# Patient Record
Sex: Female | Born: 1935
Health system: Southern US, Community
[De-identification: ages and names within clinical notes are randomized; demographics above are authoritative.]

## PROBLEM LIST (undated history)

## (undated) DIAGNOSIS — R1310 Dysphagia, unspecified: Secondary | ICD-10-CM

## (undated) DIAGNOSIS — K922 Gastrointestinal hemorrhage, unspecified: Secondary | ICD-10-CM

## (undated) DIAGNOSIS — I619 Nontraumatic intracerebral hemorrhage, unspecified: Secondary | ICD-10-CM

## (undated) DIAGNOSIS — C49A2 Gastrointestinal stromal tumor of stomach: Secondary | ICD-10-CM

## (undated) DIAGNOSIS — F329 Major depressive disorder, single episode, unspecified: Secondary | ICD-10-CM

## (undated) DIAGNOSIS — E7800 Pure hypercholesterolemia, unspecified: Secondary | ICD-10-CM

## (undated) DIAGNOSIS — R471 Dysarthria and anarthria: Secondary | ICD-10-CM

## (undated) DIAGNOSIS — I639 Cerebral infarction, unspecified: Secondary | ICD-10-CM

## (undated) DIAGNOSIS — I10 Essential (primary) hypertension: Secondary | ICD-10-CM

## (undated) DIAGNOSIS — G459 Transient cerebral ischemic attack, unspecified: Secondary | ICD-10-CM

## (undated) DIAGNOSIS — K219 Gastro-esophageal reflux disease without esophagitis: Secondary | ICD-10-CM

## (undated) DIAGNOSIS — M25519 Pain in unspecified shoulder: Secondary | ICD-10-CM

## (undated) DIAGNOSIS — F29 Unspecified psychosis not due to a substance or known physiological condition: Secondary | ICD-10-CM

## (undated) DIAGNOSIS — R2681 Unsteadiness on feet: Secondary | ICD-10-CM

## (undated) DIAGNOSIS — F0390 Unspecified dementia without behavioral disturbance: Secondary | ICD-10-CM

## (undated) DIAGNOSIS — E119 Type 2 diabetes mellitus without complications: Secondary | ICD-10-CM

## (undated) DIAGNOSIS — E039 Hypothyroidism, unspecified: Secondary | ICD-10-CM

## (undated) DIAGNOSIS — K279 Peptic ulcer, site unspecified, unspecified as acute or chronic, without hemorrhage or perforation: Secondary | ICD-10-CM

## (undated) DIAGNOSIS — E559 Vitamin D deficiency, unspecified: Secondary | ICD-10-CM

## (undated) DIAGNOSIS — F32A Depression, unspecified: Secondary | ICD-10-CM

## (undated) DIAGNOSIS — I2510 Atherosclerotic heart disease of native coronary artery without angina pectoris: Secondary | ICD-10-CM

## (undated) DIAGNOSIS — M1990 Unspecified osteoarthritis, unspecified site: Secondary | ICD-10-CM

## (undated) HISTORY — PX: OTHER SURGICAL HISTORY: SHX169

## (undated) HISTORY — DX: Unspecified osteoarthritis, unspecified site: M19.90

## (undated) HISTORY — DX: Peptic ulcer, site unspecified, unspecified as acute or chronic, without hemorrhage or perforation: K27.9

## (undated) HISTORY — DX: Hypothyroidism, unspecified: E03.9

## (undated) HISTORY — DX: Pure hypercholesterolemia, unspecified: E78.00

## (undated) HISTORY — DX: Transient cerebral ischemic attack, unspecified: G45.9

## (undated) HISTORY — PX: CORONARY ARTERY BYPASS GRAFT: SHX141

## (undated) HISTORY — DX: Atherosclerotic heart disease of native coronary artery without angina pectoris: I25.10

## (undated) HISTORY — PX: ABDOMINAL HYSTERECTOMY: SHX81

## (undated) HISTORY — DX: Gastro-esophageal reflux disease without esophagitis: K21.9

## (undated) HISTORY — PX: CATARACT EXTRACTION: SUR2

## (undated) HISTORY — DX: Gastrointestinal stromal tumor of stomach: C49.A2

## (undated) HISTORY — DX: Type 2 diabetes mellitus without complications: E11.9

## (undated) HISTORY — DX: Gastrointestinal hemorrhage, unspecified: K92.2

## (undated) HISTORY — DX: Essential (primary) hypertension: I10

---

## 1999-06-15 HISTORY — PX: PARTIAL GASTRECTOMY: SHX2172

## 2001-04-19 ENCOUNTER — Other Ambulatory Visit: Admission: RE | Admit: 2001-04-19 | Discharge: 2001-04-19 | Payer: Self-pay | Admitting: Endocrinology

## 2001-04-28 ENCOUNTER — Encounter: Payer: Self-pay | Admitting: Endocrinology

## 2001-04-28 ENCOUNTER — Ambulatory Visit (HOSPITAL_COMMUNITY): Admission: RE | Admit: 2001-04-28 | Discharge: 2001-04-28 | Payer: Self-pay | Admitting: Endocrinology

## 2001-04-28 ENCOUNTER — Encounter (INDEPENDENT_AMBULATORY_CARE_PROVIDER_SITE_OTHER): Payer: Self-pay | Admitting: *Deleted

## 2003-01-18 ENCOUNTER — Encounter (HOSPITAL_COMMUNITY): Admission: RE | Admit: 2003-01-18 | Discharge: 2003-02-17 | Payer: Self-pay | Admitting: Endocrinology

## 2005-01-06 ENCOUNTER — Ambulatory Visit: Payer: Self-pay | Admitting: Cardiology

## 2005-01-06 ENCOUNTER — Inpatient Hospital Stay (HOSPITAL_COMMUNITY): Admission: AD | Admit: 2005-01-06 | Discharge: 2005-01-21 | Payer: Self-pay | Admitting: Cardiology

## 2005-01-07 ENCOUNTER — Ambulatory Visit: Payer: Self-pay | Admitting: Cardiology

## 2005-01-08 ENCOUNTER — Encounter: Payer: Self-pay | Admitting: Cardiology

## 2005-01-28 ENCOUNTER — Encounter: Admission: RE | Admit: 2005-01-28 | Discharge: 2005-01-28 | Payer: Self-pay | Admitting: Cardiothoracic Surgery

## 2005-02-09 ENCOUNTER — Ambulatory Visit: Payer: Self-pay | Admitting: Cardiovascular Disease

## 2005-03-01 ENCOUNTER — Encounter (HOSPITAL_COMMUNITY): Admission: RE | Admit: 2005-03-01 | Discharge: 2005-03-12 | Payer: Self-pay | Admitting: Cardiology

## 2005-03-15 ENCOUNTER — Encounter (HOSPITAL_COMMUNITY): Admission: RE | Admit: 2005-03-15 | Discharge: 2005-04-14 | Payer: Self-pay | Admitting: Cardiology

## 2005-03-28 ENCOUNTER — Emergency Department (HOSPITAL_COMMUNITY): Admission: EM | Admit: 2005-03-28 | Discharge: 2005-03-28 | Payer: Self-pay | Admitting: Emergency Medicine

## 2005-04-16 ENCOUNTER — Encounter (HOSPITAL_COMMUNITY): Admission: RE | Admit: 2005-04-16 | Discharge: 2005-05-16 | Payer: Self-pay | Admitting: Cardiology

## 2005-05-05 ENCOUNTER — Ambulatory Visit: Payer: Self-pay | Admitting: Cardiology

## 2005-06-08 ENCOUNTER — Ambulatory Visit: Payer: Self-pay | Admitting: Cardiology

## 2005-06-16 ENCOUNTER — Ambulatory Visit: Payer: Self-pay | Admitting: Cardiology

## 2005-09-13 ENCOUNTER — Encounter (HOSPITAL_COMMUNITY): Admission: RE | Admit: 2005-09-13 | Discharge: 2005-10-13 | Payer: Self-pay | Admitting: Cardiology

## 2005-10-14 ENCOUNTER — Encounter (HOSPITAL_COMMUNITY): Admission: RE | Admit: 2005-10-14 | Discharge: 2005-11-13 | Payer: Self-pay | Admitting: Cardiology

## 2005-11-03 ENCOUNTER — Ambulatory Visit: Payer: Self-pay | Admitting: *Deleted

## 2005-11-03 ENCOUNTER — Inpatient Hospital Stay (HOSPITAL_COMMUNITY): Admission: EM | Admit: 2005-11-03 | Discharge: 2005-11-05 | Payer: Self-pay | Admitting: Emergency Medicine

## 2005-11-15 ENCOUNTER — Encounter (HOSPITAL_COMMUNITY): Admission: RE | Admit: 2005-11-15 | Discharge: 2005-12-15 | Payer: Self-pay | Admitting: Cardiology

## 2005-12-17 ENCOUNTER — Encounter (HOSPITAL_COMMUNITY): Admission: RE | Admit: 2005-12-17 | Discharge: 2006-01-16 | Payer: Self-pay | Admitting: Cardiology

## 2006-01-26 ENCOUNTER — Ambulatory Visit: Payer: Self-pay | Admitting: Cardiology

## 2006-02-24 ENCOUNTER — Ambulatory Visit: Payer: Self-pay | Admitting: Cardiology

## 2006-03-28 ENCOUNTER — Ambulatory Visit: Payer: Self-pay | Admitting: Cardiology

## 2007-04-21 ENCOUNTER — Ambulatory Visit: Payer: Self-pay | Admitting: Cardiology

## 2007-10-18 ENCOUNTER — Encounter: Payer: Self-pay | Admitting: Physician Assistant

## 2007-10-18 ENCOUNTER — Ambulatory Visit: Payer: Self-pay | Admitting: Cardiology

## 2007-11-27 ENCOUNTER — Ambulatory Visit (HOSPITAL_COMMUNITY): Admission: RE | Admit: 2007-11-27 | Discharge: 2007-11-27 | Payer: Self-pay | Admitting: Urology

## 2007-11-28 ENCOUNTER — Ambulatory Visit: Payer: Self-pay | Admitting: Cardiology

## 2008-01-02 ENCOUNTER — Ambulatory Visit (HOSPITAL_COMMUNITY): Admission: RE | Admit: 2008-01-02 | Discharge: 2008-01-02 | Payer: Self-pay | Admitting: Urology

## 2008-04-17 ENCOUNTER — Ambulatory Visit (HOSPITAL_COMMUNITY): Admission: RE | Admit: 2008-04-17 | Discharge: 2008-04-17 | Payer: Self-pay | Admitting: Urology

## 2008-05-02 ENCOUNTER — Ambulatory Visit (HOSPITAL_COMMUNITY): Admission: RE | Admit: 2008-05-02 | Discharge: 2008-05-02 | Payer: Self-pay | Admitting: Urology

## 2008-05-28 ENCOUNTER — Inpatient Hospital Stay (HOSPITAL_COMMUNITY): Admission: EM | Admit: 2008-05-28 | Discharge: 2008-06-01 | Payer: Self-pay | Admitting: Emergency Medicine

## 2008-05-28 ENCOUNTER — Ambulatory Visit: Payer: Self-pay | Admitting: Cardiovascular Disease

## 2008-05-29 ENCOUNTER — Encounter: Payer: Self-pay | Admitting: Cardiology

## 2008-05-29 ENCOUNTER — Encounter (INDEPENDENT_AMBULATORY_CARE_PROVIDER_SITE_OTHER): Payer: Self-pay | Admitting: Internal Medicine

## 2008-06-01 ENCOUNTER — Encounter: Payer: Self-pay | Admitting: Cardiology

## 2008-07-03 ENCOUNTER — Ambulatory Visit: Payer: Self-pay | Admitting: Cardiology

## 2008-12-19 ENCOUNTER — Ambulatory Visit (HOSPITAL_COMMUNITY): Admission: RE | Admit: 2008-12-19 | Discharge: 2008-12-19 | Payer: Self-pay | Admitting: Urology

## 2008-12-19 ENCOUNTER — Telehealth: Payer: Self-pay | Admitting: Cardiology

## 2008-12-31 ENCOUNTER — Ambulatory Visit: Payer: Self-pay | Admitting: Cardiology

## 2009-01-02 ENCOUNTER — Encounter: Payer: Self-pay | Admitting: Cardiology

## 2009-01-12 ENCOUNTER — Ambulatory Visit: Payer: Self-pay | Admitting: Cardiology

## 2009-01-23 ENCOUNTER — Telehealth: Payer: Self-pay | Admitting: Cardiology

## 2009-01-24 ENCOUNTER — Ambulatory Visit: Payer: Self-pay | Admitting: Cardiology

## 2009-01-27 DIAGNOSIS — I080 Rheumatic disorders of both mitral and aortic valves: Secondary | ICD-10-CM | POA: Insufficient documentation

## 2009-01-27 DIAGNOSIS — I25810 Atherosclerosis of coronary artery bypass graft(s) without angina pectoris: Secondary | ICD-10-CM

## 2009-01-27 DIAGNOSIS — E785 Hyperlipidemia, unspecified: Secondary | ICD-10-CM

## 2009-01-27 DIAGNOSIS — I10 Essential (primary) hypertension: Secondary | ICD-10-CM

## 2009-01-28 ENCOUNTER — Ambulatory Visit: Payer: Self-pay | Admitting: Cardiology

## 2009-01-28 ENCOUNTER — Encounter: Payer: Self-pay | Admitting: Cardiology

## 2009-01-28 DIAGNOSIS — R5381 Other malaise: Secondary | ICD-10-CM

## 2009-01-28 DIAGNOSIS — R141 Gas pain: Secondary | ICD-10-CM

## 2009-01-28 DIAGNOSIS — R142 Eructation: Secondary | ICD-10-CM

## 2009-01-28 DIAGNOSIS — R143 Flatulence: Secondary | ICD-10-CM

## 2009-01-28 DIAGNOSIS — R5383 Other fatigue: Secondary | ICD-10-CM

## 2009-06-14 DIAGNOSIS — E039 Hypothyroidism, unspecified: Secondary | ICD-10-CM

## 2009-06-14 HISTORY — DX: Hypothyroidism, unspecified: E03.9

## 2009-06-15 ENCOUNTER — Encounter: Payer: Self-pay | Admitting: Cardiology

## 2009-06-16 ENCOUNTER — Encounter: Payer: Self-pay | Admitting: Cardiology

## 2009-08-07 ENCOUNTER — Encounter: Payer: Self-pay | Admitting: Cardiology

## 2009-09-09 ENCOUNTER — Ambulatory Visit: Payer: Self-pay | Admitting: Cardiology

## 2009-09-27 ENCOUNTER — Emergency Department (HOSPITAL_COMMUNITY): Admission: EM | Admit: 2009-09-27 | Discharge: 2009-09-27 | Payer: Self-pay | Admitting: Emergency Medicine

## 2009-10-15 ENCOUNTER — Encounter: Payer: Self-pay | Admitting: Physician Assistant

## 2009-12-08 ENCOUNTER — Encounter: Payer: Self-pay | Admitting: Physician Assistant

## 2009-12-13 IMAGING — CR DG ABDOMEN 1V
1 series · 1 of 1 positions shown · non-contrast
Comparison: 05/02/2008 and earlier.

CLINICAL DATA: 82-year-old female with lower abdominal pain,
frequent urination, left-sided stenting.

ABDOMEN - 1 VIEW

[view not recorded]
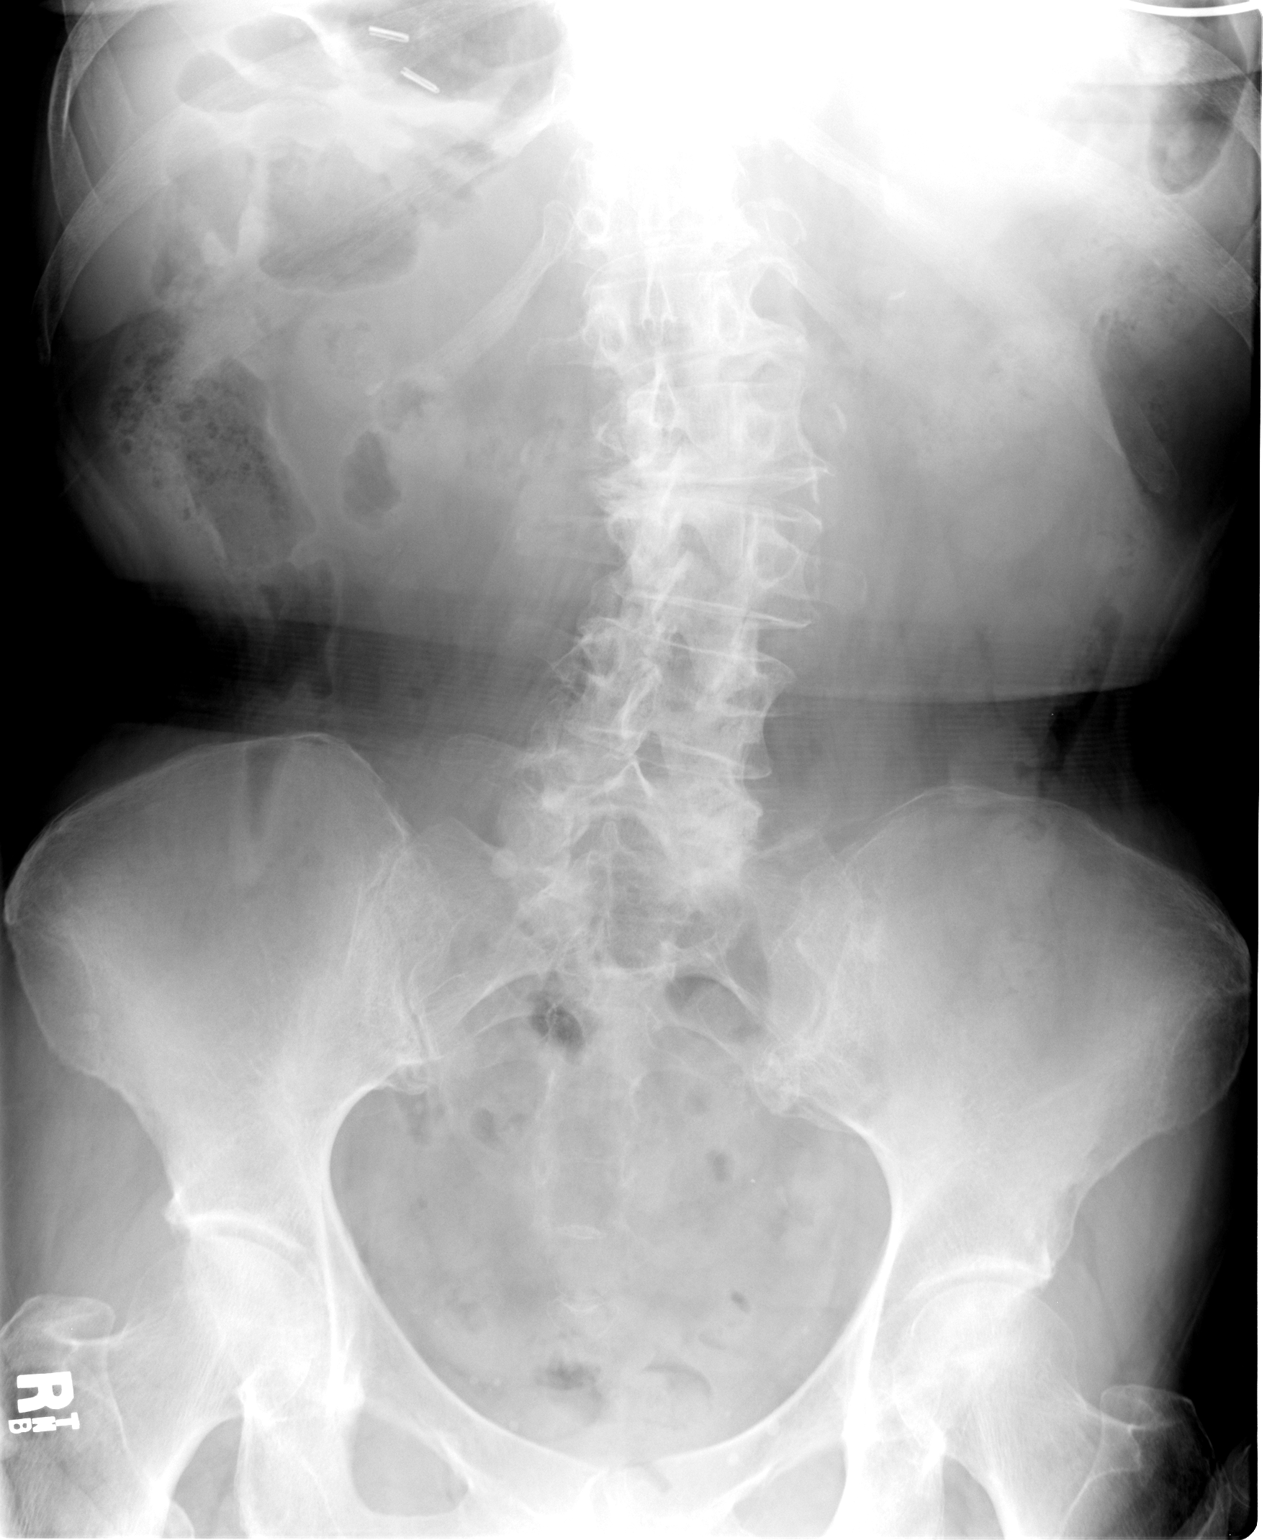

[1 of 1 positions shown; findings below may reference images not displayed]

FINDINGS: Stable right lower pole nephrolithiasis.  Stable
calcification in the region of the left upper pole, but seen to
correlate with pancreatic calcification on prior CT.  Calcified
atherosclerosis of the aorta also noted.  Stable moderate lumbar
scoliosis and multilevel degenerative changes.  Multiple pelvic
phleboliths.  Stable right gonadal vein large phlebolith which
projects at the right L5-S1 contour.  There is a punctate
hyperdensity in the left hemi pelvis which is not correlated on
previous exams. Visualized bowel gas pattern is nonobstructed.
IMPRESSION: 1.  Possible tiny distal left ureteral calculus measuring 2 mm,
versus artifact.
2.  Otherwise stable right nephrolithiasis and multiple vascular
calcifications elsewhere.

## 2009-12-22 ENCOUNTER — Encounter: Payer: Self-pay | Admitting: Cardiology

## 2010-03-23 ENCOUNTER — Ambulatory Visit: Payer: Self-pay | Admitting: Physician Assistant

## 2010-03-23 DIAGNOSIS — E119 Type 2 diabetes mellitus without complications: Secondary | ICD-10-CM

## 2010-03-23 DIAGNOSIS — R0602 Shortness of breath: Secondary | ICD-10-CM

## 2010-03-27 ENCOUNTER — Ambulatory Visit: Payer: Self-pay | Admitting: Cardiology

## 2010-03-27 ENCOUNTER — Encounter: Payer: Self-pay | Admitting: Physician Assistant

## 2010-04-07 ENCOUNTER — Encounter (INDEPENDENT_AMBULATORY_CARE_PROVIDER_SITE_OTHER): Payer: Self-pay | Admitting: *Deleted

## 2010-05-13 ENCOUNTER — Emergency Department (HOSPITAL_COMMUNITY)
Admission: EM | Admit: 2010-05-13 | Discharge: 2010-05-14 | Payer: Self-pay | Source: Home / Self Care | Admitting: Emergency Medicine

## 2010-07-10 ENCOUNTER — Emergency Department (HOSPITAL_COMMUNITY)
Admission: EM | Admit: 2010-07-10 | Discharge: 2010-07-11 | Disposition: A | Discharge status: Other Acute Inpatient Hospital | Payer: Self-pay | Source: Home / Self Care | Admitting: Emergency Medicine

## 2010-07-10 LAB — URINALYSIS, ROUTINE W REFLEX MICROSCOPIC
Nitrite: NEGATIVE
Specific Gravity, Urine: 1.015 (ref 1.005–1.030)
Urobilinogen, UA: 0.2 mg/dL (ref 0.0–1.0)

## 2010-07-10 LAB — POCT I-STAT, CHEM 8
Calcium, Ion: 0.9 mmol/L — ABNORMAL LOW (ref 1.12–1.32)
Chloride: 101 mEq/L (ref 96–112)
HCT: 39 % (ref 36.0–46.0)
Potassium: 4.4 mEq/L (ref 3.5–5.1)
Sodium: 131 mEq/L — ABNORMAL LOW (ref 135–145)

## 2010-07-10 LAB — DIFFERENTIAL
Basophils Relative: 0 % (ref 0–1)
Eosinophils Absolute: 0.2 10*3/uL (ref 0.0–0.7)
Lymphs Abs: 2.6 10*3/uL (ref 0.7–4.0)
Neutro Abs: 5.7 10*3/uL (ref 1.7–7.7)
Neutrophils Relative %: 63 % (ref 43–77)

## 2010-07-10 LAB — POCT CARDIAC MARKERS
CKMB, poc: <1 ng/mL — ABNORMAL LOW (ref 1.0–8.0)
Troponin i, poc: <0.05 ng/mL (ref 0.00–0.09)

## 2010-07-10 LAB — PROTIME-INR: INR: 0.9 (ref 0.00–1.49)

## 2010-07-10 LAB — CBC
Hemoglobin: 13.5 g/dL (ref 12.0–15.0)
MCV: 84.9 fL (ref 78.0–100.0)
Platelets: 284 10*3/uL (ref 150–400)
RBC: 4.5 MIL/uL (ref 3.87–5.11)
WBC: 9.1 10*3/uL (ref 4.0–10.5)

## 2010-07-10 LAB — COMPREHENSIVE METABOLIC PANEL
AST: 22 U/L (ref 0–37)
Albumin: 3.8 g/dL (ref 3.5–5.2)
Alkaline Phosphatase: 67 U/L (ref 39–117)
Chloride: 96 mEq/L (ref 96–112)
GFR calc Af Amer: >60 mL/min (ref >60)
Potassium: 3.6 mEq/L (ref 3.5–5.1)
Total Bilirubin: 0.3 mg/dL (ref 0.3–1.2)

## 2010-07-10 LAB — APTT: aPTT: 31 seconds (ref 24–37)

## 2010-07-10 LAB — GLUCOSE, CAPILLARY: Glucose-Capillary: 176 mg/dL — ABNORMAL HIGH (ref 70–99)

## 2010-07-11 ENCOUNTER — Inpatient Hospital Stay (HOSPITAL_COMMUNITY)
Admission: EM | Admit: 2010-07-11 | Discharge: 2010-07-13 | Payer: Self-pay | Attending: Internal Medicine | Admitting: Internal Medicine

## 2010-07-11 LAB — LIPID PANEL
Cholesterol: 143 mg/dL (ref 0–200)
LDL Cholesterol: 60 mg/dL (ref 0–99)
VLDL: 41 mg/dL — ABNORMAL HIGH (ref 0–40)

## 2010-07-11 LAB — GLUCOSE, CAPILLARY: Glucose-Capillary: 130 mg/dL — ABNORMAL HIGH (ref 70–99)

## 2010-07-11 LAB — TSH: TSH: 3.455 u[IU]/mL (ref 0.350–4.500)

## 2010-07-11 LAB — VITAMIN B12: Vitamin B-12: 860 pg/mL (ref 211–911)

## 2010-07-11 LAB — HEMOGLOBIN A1C: Mean Plasma Glucose: 134 mg/dL — ABNORMAL HIGH (ref <117)

## 2010-07-12 LAB — GLUCOSE, CAPILLARY
Glucose-Capillary: 161 mg/dL — ABNORMAL HIGH (ref 70–99)
Glucose-Capillary: 100 mg/dL — ABNORMAL HIGH (ref 70–99)
Glucose-Capillary: 109 mg/dL — ABNORMAL HIGH (ref 70–99)
Glucose-Capillary: 136 mg/dL — ABNORMAL HIGH (ref 70–99)

## 2010-07-13 LAB — BASIC METABOLIC PANEL
CO2: 28 mEq/L (ref 19–32)
Chloride: 100 mEq/L (ref 96–112)
Creatinine, Ser: 0.79 mg/dL (ref 0.4–1.2)
GFR calc Af Amer: >60 mL/min (ref >60)
Glucose, Bld: 107 mg/dL — ABNORMAL HIGH (ref 70–99)

## 2010-07-13 NOTE — Consult Note (Signed)
[name], SWIERK NO.:  [id number]  MEDICAL RECORD NO.:  [id number]          PATIENT TYPE:  INP  LOCATION:  3006                         FACILITY:  [facility]  PHYSICIAN:  Jill Alexanders, M.D.  DATE OF BIRTH:  June 14, 1927  DATE OF CONSULTATION:  07/11/2010 DATE OF DISCHARGE:                                CONSULTATION   HISTORY OF PRESENT ILLNESS:  [name] is an 77 year old right- handed white female, born on 02-18-1927, with a history of cerebrovascular disease and coronary artery disease.  This patient noted onset of sudden confusion that occurred around 7 p.m. on July 10, 2010.  This patient suddenly could not remember the name of her family members and became somewhat agitated.  The patient did not have any focal numbness or weakness on the face, arms, or legs.  The patient did report a mild headache.  No slurring of speech was noted.  The patient may have had some minimal gait unsteadiness.  The patient was brought to the emergency room and a CT scan of the head was done showing no acute changes.  Old lacuna infarcts in left basal ganglia area, deep white matter in the left posterior frontal lobe were noted.  The patient was not felt to be a candidate for t-PA secondary to minimal deficit and duration of symptoms.  Family claims that the patient was noted to be in atrial fibrillation transiently, but this is not documented by the admission note by Dr. Marin Comment.  The patient is currently in normal sinus rhythm.  Old records indicate a history of atrial fibrillation in the past, but the patient has never been placed on Coumadin.  The patient was sent down for further evaluation.  The patient recently had a 2-D echocardiogram in November 2011 due to a history of some increased shortness of breath and fatigue.  The patient has been on antiplatelet agents prior to admission that include Plavix.  NIH stroke scale score currently is 0.  PAST MEDICAL  HISTORY:  Significant for: 1. Transient confusion, questionable TIA versus hypertensive crisis,     blood pressure was 226/154 when the patient was initially     evaluated. 2. History of hypertension. 3. Paroxysmal atrial fibrillation. 4. Coronary artery disease, status post CABG x4. 5. Borderline diabetes. 6. Stomach cancer, status post resection. 7. Hysterectomy. 8. Left centrum semiovale infarct in December 2009. 9. Dyslipidemia. 10.Renal calculi. 11.History of thyroid surgery, and history of hypothyroidism. 12.Peripheral vascular disease. 13.Gallbladder resection. 14.Cataract surgery. 15.Degenerative arthritis.  The patient has allergy to SULFA DRUGS which cause anaphylaxis, CODEINE resultant hives.  The patient has also allergy to IVP DYE.  The patient does not smoke or drink.  MEDICATIONS PRIOR TO ADMISSION: 1. Lopressor 50 mg twice daily. 2. Hydrochlorothiazide 12.5 mg daily. 3. Lisinopril 10 mg twice daily. 4. Levoxyl 88 mcg daily. 5. Simvastatin 20 mg daily. 6. Zantac 150 mg daily. 7. Zyrtec 10 mg daily. 8. Plavix 75 mg daily. 9. Vitamin D and calcium supplementation. 10.Lorazepam 1 mg twice daily. 11.Percocet if needed.  SOCIAL HISTORY:  This patient is widowed, is retired, lives in the  [place], [place] area.  The patient has four children.  One son has severe rheumatoid arthritis, another son has congestive heart failure, another has cholesterol issues.  FAMILY MEDICAL HISTORY:  Father died with heart attack.  Mother died in her 43s of "old age."  The patient had three brothers, three sisters, only two sisters and one brother are still living.  Two brothers and one sister died with heart disease.  REVIEW OF SYSTEMS:  Notable for no recent fevers or chills.  The patient claims she feels cold all the time.  The patient notes occasional neck pain, joint pains in the shoulders, does note shortness of breath. Denies nausea or vomiting, but has some  slight chest wall pain.  The patient denies any problems controlling bowels or bladder, has not had any dizziness or blacking-out episodes.  PHYSICAL EXAMINATION:  VITAL SIGNS:  Blood pressure currently is 122/70, heart rate 61, respiratory rate 18, temperature afebrile. GENERAL:  This patient is a fairly well-developed, elderly, white female who is alert and cooperative at the time of examination. HEENT:  Head is atraumatic.  Eyes:  Pupils are equal, round, and reactive to light. NECK:  Supple.  No carotid bruits noted. RESPIRATORY:  Clear. CARDIOVASCULAR:  Reveals a regular rate and rhythm.  No obvious murmurs or rubs noted. EXTREMITIES:  Without significant edema. NEUROLOGIC:  Cranial nerves as above.  Facial symmetry is present.  The patient has good sensation of face to pinprick, soft touch bilaterally, has good strength of facial muscles and the muscles of head turn and shoulder shrug bilaterally.  Motor testing reveals 5/5 strength in all fours.  No drift is seen on the arms or legs.  Pinprick, soft touch, and vibratory sensation is symmetric on the arms and legs.  The patient has good finger-nose-finger and heel-to-shin bilaterally.  Gait was not tested.  Deep tendon reflexes are symmetric.  The patient has no evidence of extinction to double simultaneous stimulation.  Toes are neutral bilaterally.  Laboratory values are notable for a white blood count of 9.0, hemoglobin of 13.5, hematocrit of 38.2, platelets of 284.  INR of 0.90.  Sodium of 134, potassium of 3.6, chloride of 96, CO2 of 29, glucose of 161, BUN of 7, creatinine of 0.75.  Total bili of 0.3, alk phos is 67, SGOT of 22, SGPT of 13, total protein of 6.6, albumin of 3.8, calcium 9.4, CK level of 43, troponin-I less than 0.01.  Cholesterol of 143, triglycerides of 206, HDL of 42, LDL of 60, VLDL of 41.  Urinalysis reveals specific gravity of 1.015, pH of 5.5.  IMPRESSION: 1. Transient episode of confusion, rule  out transient ischemic attack     versus hypertensive crisis versus possible seizure. 2. History of cerebrovascular disease. 3. Hypertension. 4. Borderline diabetes. 5. History of coronary artery disease.  This patient clearly has multiple risk factors for stroke.  The patient does have a history of atrial fibrillation apparently and there is some question whether the patient may have been noted to be in transient atrial fibrillation around the time of admission.  If this is documented, the patient would be a Coumadin candidate.  If the recent episode of atrial fibrillation is not clear, the patient may benefit from prolonged CardioNet monitoring to determine whether atrial fibrillation is in fact present.  The patient had been on Plavix prior to coming in.  The patient will undergo further stroke workup including MRI of the brain, MRI angiogram of the head, carotid Doppler study,  and 2-D echocardiogram.  We will follow the patient's clinical course while in-house.     Jill Alexanders, M.D.     CKW/MEDQ  D:  07/11/2010  T:  07/12/2010  Job:  [id number]  cc:   [facility] Dr. Woody Seller  Electronically Signed by Roxy Cedar M.D. on 07/13/2010 07:49:20 AM

## 2010-07-16 NOTE — Letter (Signed)
Summary: Discharge Summary  Discharge Summary   Imported By: Delfino Lovett 09/09/2009 10:47:27  _____________________________________________________________________  External Attachment:    Type:   Image     Comment:   External Document

## 2010-07-16 NOTE — Letter (Signed)
Summary: Risk analyst at [facility]. [address]   [place], [place] [address]   Phone: [phone]  Fax: [phone]        April 07, 2010 MRN: [id number]   SARA GAUTREAUX [address] [place], [place]  [address]   Dear Ms. Yera,  Your test ordered by Rande Lawman has been reviewed by your physician (or physician assistant) and was found to be normal or stable. Your physician (or physician assistant) felt no changes were needed at this time.  __X__ Echocardiogram  ____ Cardiac Stress Test  __X__ Lab Work  ____ Peripheral vascular study of arms, legs or neck  ____ CT scan or X-ray  ____ Lung or Breathing test  ____ Other:   Thank you.   Lovina Reach, LPN    Bryon Lions, M.D., F.A.C.C. Maceo Pro, M.D., F.A.C.C. Cammy Copa, M.D., F.A.C.C. Vonda Antigua, M.D., F.A.C.C. Vita Barley, M.D., F.A.C.C. Mare Ferrari, M.D., F.A.C.C. Emi Belfast, PA-C

## 2010-07-16 NOTE — Assessment & Plan Note (Signed)
Summary: [name]   Visit Type:  Follow-up Primary Provider:  vyas  CC:  No Cardiology Complaints Today.  History of Present Illness: the patient is a 77 year old female history of atypical chest pain. The patient status post stress testing with normal LV function, no ischemia and an ejection fraction of 79%. She also had cardiac monitors showed no significant arrhythmias. The patient is suffering from bladder problems and is seeing a urologist. She is occasional stomach pains. At times she reports some shortness of breath associated with coughing and white phlegm. She denies any audible wheezing. She denies any substernal chest pain orthopnea PND. The patient however some significant amount of stress because of her granddaughter separated her husband and moved in with her. She has no significant cardiovascular complaints.  Current Medications (verified): 1)  Hydrochlorothiazide 25 Mg Tabs (Hydrochlorothiazide) .... Take One Half Tablet By Mouth Daily. 2)  Lidocaine 5 % Oint (Lidocaine) .... As Needed 3)  Nitroglycerin 0.4 Mg Subl (Nitroglycerin) .... Place 1 Tablet Under Tongue As Directed 4)  Centrum Silver  Tabs (Multiple Vitamins-Minerals) .... Once Daily 5)  Zantac 150 Mg Tabs (Ranitidine Hcl) .... Two Times A Day 6)  Levoxyl 75 Mcg Tabs (Levothyroxine Sodium) .... Once Daily 7)  Plavix 75 Mg Tabs (Clopidogrel Bisulfate) 8)  Lisinopril 10 Mg Tabs (Lisinopril) .... Two Times A Day 9)  Simvastatin 20 Mg Tabs (Simvastatin) .... Once Daily 10)  Metoprolol Tartrate 50 Mg Tabs (Metoprolol Tartrate) .... Two Times A Day 11)  Allegra 180 Mg Tabs (Fexofenadine Hcl) .... Once Daily 12)  Ativan 1 Mg Tabs (Lorazepam) .... Two Times A Day As Needed 13)  Oscal 500/200 D-3 500-200 Mg-Unit Tabs (Calcium-Vitamin D) .... Take 1 Tab Two Times A Day 14)  Astepro 0.15 % Soln (Azelastine Hcl) .... Use As Needed  Allergies (verified): 1)  Codeine 2)  Sulfa  Past History:  Past Medical  History: Last updated: 01/27/2009 MITRAL REGURGITATION, 1-2 PLUS (ICD-396.3) HYPERTENSION, UNSPECIFIED (ICD-401.9) HYPERLIPIDEMIA-MIXED (ICD-272.4) CAD, ARTERY BYPASS GRAFT (ICD-414.04)  Past Surgical History: Last updated: 01/27/2009 Partial gastrectomy for cancer Thyroid surgery Gallbladder resection Abdominal Hysterectomy-Total Cataract Extraction CABG 12/2004  Family History: Last updated: 01/27/2009 Family History of Coronary Artery Disease:  Family History of Hypertension:   Social History: Last updated: 01/27/2009 Widowed  Tobacco Use - No.  Alcohol Use - no Drug Use - no  Risk Factors: Smoking Status: never (01/27/2009)  Review of Systems       The patient complains of shortness of breath, prolonged cough, and anxiety.  The patient denies fatigue, malaise, fever, weight gain/loss, vision loss, decreased hearing, hoarseness, chest pain, palpitations, wheezing, sleep apnea, coughing up blood, abdominal pain, blood in stool, nausea, vomiting, diarrhea, heartburn, incontinence, blood in urine, muscle weakness, joint pain, leg swelling, rash, skin lesions, headache, fainting, dizziness, depression, enlarged lymph nodes, easy bruising or bleeding, and environmental allergies.    Vital Signs:  Patient profile:   77 year old female Weight:      122 pounds Pulse rate:   69 / minute BP sitting:   133 / 78  (right arm)  Vitals Entered By: Doretha Sou, CNA (September 09, 2009 11:15 AM)  Physical Exam  Additional Exam:  General: Well-developed, well-nourished in no distress head: Normocephalic and atraumatic eyes PERRLA/EOMI intact, conjunctiva and lids normal nose: No deformity or lesions mouth normal dentition, normal posterior pharynx neck: Supple, no JVD.  No masses, thyromegaly or abnormal cervical nodes lungs: Normal breath sounds bilaterally without  wheezing.  Normal percussion heart: regular rate and rhythm with normal S1 and S2, no S3 or S4.  PMI is normal.  No  pathological murmurs abdomen: Normal bowel sounds, abdomen is soft and nontender without masses, organomegaly or hernias noted.  No hepatosplenomegaly musculoskeletal: Back normal, normal gait muscle strength and tone normal pulsus: Pulse is normal in all 4 extremities Extremities: No peripheral pitting edema neurologic: Alert and oriented x 3 skin: Intact without lesions or rashes cervical nodes: No significant adenopathy psychologic: Normal affect    EKG  Procedure date:  09/09/2009  Findings:      normal sinus rhythm. Left axis deviation. Right bundle branch block. Otherwise normal tracing. Heart rate 65 beats per minute  Impression & Recommendations:  Problem # 1:  HYPERTENSION, UNSPECIFIED (ICD-401.9) stable Her updated medication list for this problem includes:    Hydrochlorothiazide 25 Mg Tabs (Hydrochlorothiazide) .Marland Kitchen... Take one half tablet by mouth daily.    Lisinopril 10 Mg Tabs (Lisinopril) .Marland Kitchen..Marland Kitchen Two times a day    Metoprolol Tartrate 50 Mg Tabs (Metoprolol tartrate) .Marland Kitchen..Marland Kitchen Two times a day  Problem # 2:  CAD, ARTERY BYPASS GRAFT (ICD-414.04) no recurrent chest pain. No evidence of ischemia by myocardial perfusion study. Continue medical therapy. Her updated medication list for this problem includes:    Nitroglycerin 0.4 Mg Subl (Nitroglycerin) .Marland Kitchen... Place 1 tablet under tongue as directed    Plavix 75 Mg Tabs (Clopidogrel bisulfate)    Lisinopril 10 Mg Tabs (Lisinopril) .Marland Kitchen..Marland Kitchen Two times a day    Metoprolol Tartrate 50 Mg Tabs (Metoprolol tartrate) .Marland Kitchen..Marland Kitchen Two times a day  Problem # 3:  HYPERLIPIDEMIA-MIXED (ICD-272.4) continue statin drug therapy. Follow blood from a care physician. Her updated medication list for this problem includes:    Simvastatin 20 Mg Tabs (Simvastatin) ..... Once daily  Patient Instructions: 1)  Your physician recommends that you continue on your current medications as directed. Please refer to the Current Medication list given to you  today. 2)  Follow up in  6 months

## 2010-07-16 NOTE — Assessment & Plan Note (Signed)
Summary: 6 month ck recv reminder   Visit Type:  Follow-up Primary Provider:  Woody Seller   History of Present Illness: patient presents for routine followup.  Since her last visit, she denies any internal development of exertional chest discomfort. she does, however, have mild DOE. She has gained 8 pounds, since last OV. She has history of normal LVF, and is on a diuretic.  Preventive Screening-Counseling & Management  Alcohol-Tobacco     Smoking Status: never  Current Medications (verified): 1)  Hydrochlorothiazide 25 Mg Tabs (Hydrochlorothiazide) .... Take One Half Tablet By Mouth Daily. 2)  Lidocaine 5 % Oint (Lidocaine) .... As Needed 3)  Nitroglycerin 0.4 Mg Subl (Nitroglycerin) .... Place 1 Tablet Under Tongue As Directed 4)  Centrum Silver  Tabs (Multiple Vitamins-Minerals) .... Once Daily 5)  Zantac 150 Mg Tabs (Ranitidine Hcl) .... Two Times A Day 6)  Levoxyl 75 Mcg Tabs (Levothyroxine Sodium) .... Once Daily 7)  Plavix 75 Mg Tabs (Clopidogrel Bisulfate) .... Take 1 Tablet By Mouth Once A Day 8)  Lisinopril 10 Mg Tabs (Lisinopril) .... Two Times A Day 9)  Simvastatin 20 Mg Tabs (Simvastatin) .... Once Daily 10)  Metoprolol Tartrate 50 Mg Tabs (Metoprolol Tartrate) .... Two Times A Day 11)  Zyrtec Allergy 10 Mg Tabs (Cetirizine Hcl) .... Take 1 Tablet By Mouth Once A Day 12)  Ativan 1 Mg Tabs (Lorazepam) .... Two Times A Day As Needed 13)  Oscal 500/200 D-3 500-200 Mg-Unit Tabs (Calcium-Vitamin D) .... Take 1 Tab Two Times A Day 14)  Astepro 0.15 % Soln (Azelastine Hcl) .... Use As Needed 15)  Percocet 5-325 Mg Tabs (Oxycodone-Acetaminophen) .... Take 1 Tablet By Mouth Two Times A Day  Allergies (verified): 1)  Codeine 2)  Sulfa  Comments:  Nurse/Medical Assistant: The patient's medication list and allergies were reviewed with the patient and were updated in the Medication and Allergy Lists.  Past History:  Past Medical History: MITRAL REGURGITATION, 1-2 PLUS  (ICD-396.3) HYPERTENSION, UNSPECIFIED (ICD-401.9) HYPERLIPIDEMIA-MIXED (ICD-272.4) CAD... four-vessel CABG, 7/06, ... normal Cardiolite; EF 79%, 8/10 Diabetes mellitus Hypothyroidism Contrast dye allergy PUD History of GIB History of stomach stromal tumor... partial gastrectomy 2001 Osteoarthritis G E R D  Review of Systems       No fevers, chills, hemoptysis, dysphagia, melena, hematocheezia, hematuria, rash, claudication, orthopnea, pedal edema. complains of legs "giving out" and cramping. All other systems negative.   Vital Signs:  Patient profile:   77 year old female Height:      63 inches Weight:      130 pounds BMI:     23.11 Pulse rate:   76 / minute BP sitting:   134 / 83  (left arm) Cuff size:   regular  Vitals Entered By: Georgina Peer (March 23, 2010 1:14 PM)  Physical Exam  Additional Exam:  GEN: 76 year old female, no distress HEENT: NCAT,PERRLA,EOMI NECK: palpable pulses, no bruits; no JVD; no TM LUNGS: CTA bilaterally HEART: RRR (S1S2); no significant murmurs; no rubs; no gallops ABD: soft, NT; intact BS EXT: intact distal pulses; no edema SKIN: warm, dry MUSC: no obvious deformity NEURO: A/O (x3)     Impression & Recommendations:  Problem # 1:  SHORTNESS OF BREATH (ICD-786.05)  2-D echocardiogram for reassessment of LV function, and severity of MR. patient had a normal Cardiolite, 8/10. She denies any exertional chest discomfort. We'll also assess BNP level, to rule out element of diastolic heart failure. Patient is on HCTZ, and we'll check metabolic profile for monitoring of  electrolytes and renal function.  Problem # 2:  CAD, ARTERY BYPASS GRAFT (ICD-414.04)  continue current medication regimen. Patient had a normal Cardiolite, 8/10. No current indication for repeat stress testing.  Problem # 3:  HYPERLIPIDEMIA-MIXED (B2193296.4)  reassessment of lipid status with FLP. Aggressive management recommended with target LDL 70 or less, if  feasible  Problem # 4:  HYPERTENSION, UNSPECIFIED (ICD-401.9) Assessment: Comment Only  Problem # 5:  DM (ICD-250.00) Assessment: Comment Only  Other Orders: T-Lipid Profile (123XX123) T-Basic Metabolic Panel (99991111) T-BNP  (B Natriuretic Peptide) [id number]) 2-D Echocardiogram (2D Echo)  Patient Instructions: 1)  Follow up with Gene Serpe, PA-C/Dr. Lutricia Feil on Monday, April 20, 2010 at [facility]. 2)  Your physician recommends that you go to the [facility] for lab work: Do not eat or drink after midnight. 3)  Your physician has requested that you have an echocardiogram.  Echocardiography is a painless test that uses sound waves to create images of your heart. It provides your doctor with information about the size and shape of your heart and how well your heart's chambers and valves are working.  This procedure takes approximately one hour. There are no restrictions for this procedure.

## 2010-07-21 NOTE — H&P (Signed)
NAMEDASHANA, [name] NO.:  [id number]  MEDICAL RECORD NO.:  [id number]          PATIENT TYPE:  EMS  LOCATION:  ED                            FACILITY:  [facility]  PHYSICIAN:  Orvan Falconer, MD           DATE OF BIRTH:  [birth year]  DATE OF ADMISSION:  07/10/2010 DATE OF DISCHARGE:  LH                             HISTORY & PHYSICAL   PRIMARY CARE PHYSICIAN:  Jerene Bears, MD  CARDIOLOGIST:  Ernestine Mcmurray, MD,FACC  NEUROLOGIST:  Dr. Izora Ribas.  ADVANCE DIRECTIVE:  Full code.  REASON FOR ADMISSION:  TIA since the last CVA.  HISTORY OF PRESENT ILLNESS:  This is an 77 year old female with history of hypertension, diabetes, and prior CVA, making her CHADS score of 5, along with prior history of paroxysmal atrial fibrillation, presents to [facility] Emergency Room with transient global confusion.  Apparently, she has been taking care of her granddaughters children and suddenly became confused, not knowing her son nor her granddaughter.  There was no reported slurred speech or focal weakness.  In the emergency room, she was found to be quite hypertensive with systolic blood pressure 123456 over diastolic of 123456.  She denied any headache, nausea, vomiting, problem with her vision, fever, chills, or any other symptomatology.  She has had no palpitation or chest pain.  Workup in the emergency room included an EKG which shows sinus rhythm at 70 with right bundle-branch block.  She has negative cardiac markers.  Her blood glucose is 176.  She has normal CBC.  A head CT shows prior old lacunar infarct in the left basal ganglia and deep white matter of the left posterior frontal lobe.  She also found to have chronic right maxillary sinusitis. While in the emergency room, Code Stroke was called, but she is not a thrombolytic therapy candidate at this time because of her improving symptoms.  Her blood pressure was better with systolic 123XX123.  She has no further neurological event.   Hospitalist was consulted for her TIA/CVA.  PAST MEDICAL HISTORY:  Coronary artery disease status post bypass, hypertension, diabetes, atrial fibrillation, stomach cancer, chronic pain syndrome, hypercholesterolemia, peripheral vascular disease, prior CVA and hypothyroidism.  SOCIAL HISTORY:  She denied tobacco, alcohol, or drug use.  She is a highly functional and has been able to take care of her grandchildren.  ALLERGY:  To SULFA, IV DYE, IODINE CONTAINING CODEINE.  CURRENT MEDICATIONS: 1. Lopressor 50 mg b.i.d. 2. HCTZ 12.5 mg per day. 3. Lisinopril 10 mg b.i.d. 4. Levoxyl 88 mcg per day. 5. Simvastatin 20 mg per day. 6. Zantac. 7. Zyrtec. 8. Plavix 75 mg per day. 9. Vitamin D and calcium. 10.Lorazepam 1 mg b.i.d. 11.Percocet.  REVIEW OF SYSTEMS:  Otherwise unremarkable.  PHYSICAL EXAMINATION:  VITAL SIGNS:  Shows blood pressure 160/80, pulse of 65, respiratory rate 16, temp 98.2. GENERAL:  Shows that she is alert and oriented and answers questions appropriately.  Her speech is rather fluent.  There is slight dysarthria.  She has facial symmetry and tongue is midline.  Uvula elevated with phonation.  I did  not hear any carotid bruit. NECK:  Supple. HEENT:  Pupils round and reactive bilaterally. CARDIAC:  Revealed S1 and S2.  There is 2/6 systolic ejection murmur at the left sternal border, but is regular. LUNGS:  Clear.  No wheezes, rales, or any evidence of consolidation. ABDOMEN:  Soft, nondistended, nontender.  There was no mass or evidence of ascites.  It is nondistended. EXTREMITIES:  Show no edema.  She had good distal pulses bilaterally. She has equal strength bilaterally.  Babinski is flexion.  Strengths are rather strong.  Proprioception is intact. NEUROLOGICAL AND PSYCHIATRIC EXAM:  Unremarkable as well.  OBJECTIVE FINDINGS:  EKG, sinus rhythm with right bundle-branch block at 70.  No acute ST-T changes.  White count of 9,100, hemoglobin of 13.5, platelet  count 284,000.  Serum sodium 131, potassium 4.1, creatinine 0.8, glucose of 159.  CT scan of the head shows minimal insignificant chronic right maxillary sinusitis.  No acute intracranial abnormality and old lacunar strokes in the left basal ganglia and deep white matter of the left posterior frontal lobe.  Troponin less than 0.005.  IMPRESSION:  This is an 77 year old with history of paroxysmal atrial fibrillation, CHADS score of 5, history of prior CVA, presents with hypertension and likely had another TIA or CVA.  I have recommended transfer to [facility] because there is no Neurology Service to see over the weekend so that Dr. Jannifer Franklin or coverage can consult on her.  I spoke with Dr. Jannifer Franklin who will see her in consultation.  I do appreciate that very much.  Because of her high CHADS score, and because she is highly functional without any contraindication, I recommended Coumadin, but patient and family are hesitant about taking this medication.  I would therefore continue her on Plavix and aspirin at this time.  Serious consideration for full anticoagulation should be given to avoid major CVA.  Dr. Jannifer Franklin will make further recommendation as well.  In the interim, I will order MRI and MRA of her head along with an echo of her heart.  We will keep the systolic blood pressure at the present point. We will continue her medications as soon as they are reconciled.  I will continue aspirin, Plavix, and simvastatin for now.  She is a full code, will be admitted to telemetry under the [facility].  PROBLEM LIST: 1. Transient ischemic attack/cerebrovascular accident. 2. Paroxysmal atrial fibrillation.  CHADS score of 5. 3. Diabetes. 4. Hypertension. 5. Coronary artery disease status post bypass/stable. 6. Full code.     Orvan Falconer, MD     PL/MEDQ  D:  07/10/2010  T:  07/10/2010  Job:  [id number]  cc:   Jill Alexanders, M.D. Fax: [phone]  Jerene Bears, MD [phone]  Electronically Signed by Orvan Falconer  on 07/21/2010 09:59:31 PM

## 2010-07-23 DIAGNOSIS — I4589 Other specified conduction disorders: Secondary | ICD-10-CM

## 2010-07-29 NOTE — Discharge Summary (Signed)
[name], [name] NO.:  [id number]  MEDICAL RECORD NO.:  [id number]          PATIENT TYPE:  INP  LOCATION:  3006                         [facility]  PHYSICIAN:  Domingo Mend, M.D. DATE OF BIRTH:  1926/09/10  DATE OF ADMISSION:  07/11/2010 DATE OF DISCHARGE:  07/13/2010                              DISCHARGE SUMMARY   PRIMARY CARE PHYSICIAN:  Dr. Woody Seller.  CARDIOLOGIST:  Ernestine Mcmurray, MD, [facility]  DISCHARGE DIAGNOSES: 1. Episode of confusion and altered mental status, resolved, likely     secondary to hypertensive urgency. 2. History of hypertension. 3. Questionable history of atrial fibrillation, [facility]     will set up for outpatient event monitor. 4. Diabetes. 5. History of stomach cancer. 6. Chronic pain syndrome. 7. Hyperlipidemia. 8. Prior history of cerebrovascular accident in 2009. 9. Hypothyroidism. 10.Coronary artery disease, status post coronary artery bypass  grafting. 0000000 diastolic congestive heart failure.  DISCHARGE MEDICATIONS: 1. Astelin nasal spray 1 spray daily as needed for allergies. 2. Plavix 75 mg daily. 3. Allegra 180 mg daily as needed for allergies. 4. Synthroid 88 mcg daily. 5. Lisinopril 10 mg 3 times a day. 6. Lorazepam 1 mg twice daily as needed. 7. Metoprolol 25 mg twice daily. 8. Multivitamin 1 tablet daily. 9. Percocet 5/325 mg every 8 hours as needed for pain. 10.Ranitidine 150 mg twice daily. 11.Simvastatin 20 mg at bedtime.  The patient has been instructed to stop taking her hydrochlorothiazide.  DISPOSITION AND FOLLOWUP:  [name] will be discharged home today in stable and improved condition.  We will have home health PT, OT, and aid set up for her.  [facility] will be contacting them through the [employer] office in regards to setting up an outpatient event monitor.  CONSULTATION THIS HOSPITALIZATION:  Theresa Alexanders, MD, with Neurology.  IMAGES AND PROCEDURES: 1. A CT  scan of the head on July 10, 2010, that showed no acute     intracranial abnormalities, old lacunar strokes in the left basal     ganglia and deep white matter of the left posterior frontal lobe. 2. An MRI of the brain on July 11, 2010, that showed no acute     intracranial abnormality with stable atrophy and white matter     disease and MRA that showed stable small vessel disease involving     the MCA branch vessels bilaterally and left greater than right PCA. 3. A 2-D echocardiogram on July 12, 2010, that showed an ejection     fraction of 55-60% with grade 1 diastolic dysfunction and carotid     Dopplers that showed no evidence for ICA stenosis bilaterally with     antegrade vertebral flow.  HISTORY AND PHYSICAL:  For details, please refer to dictation on July 10, 2010, by Dr. Marin Comment, but in brief [name] is a pleasant 77 year old lady with a history of a CVA in 2009 who presented to [facility] with transient global confusion.  She was noted to have a blood pressure of 203/104.  Because of the possibility of a code stroke, she was transferred to [facility] for further  evaluation.  HOSPITAL COURSE BY PROBLEM: 1. Transient confusion and altered mental status:  We believe at this     point that this was most likely secondary to a hypertensive     urgency.  Of course, TIA has not been completely discarded nor has     a partial seizure.  However, we did note that as soon as her blood     pressure was corrected that her altered mental status resolved.     Regardless, she will be set up for an outpatient event monitor     given that her history of AFib is still somewhat unclear. If she     indeed has AFib,  she has a very high CHADS score and with     her prior history of stroke she would certainly of benefit from     being on chronic anticoagulation with Coumadin.  Also, if she were     to have repeated episodes of this, then an EEG would be possibly     the next step in her  management. 2. Atrial fibrillation:  Please see above, she will be set up with     outpatient event monitor. 3. Hypertensive urgency:  Her blood pressure has decreased to about     150/90 with conservative treatment and oral medications.  She never     required to be on an IV drip. 4. Hyperlipidemia:  She is to continue her statin. 5. Rest of her chronic issues have been stable.  VITAL SIGNS ON DAY OF DISCHARGE:  Blood pressure 152/80, heart rate 61, respirations 16, sats of 96% on room air, and a temp of 97.5.     Domingo Mend, M.D.     EH/MEDQ  D:  07/13/2010  T:  07/14/2010  Job:  [id number]  cc:   Ernestine Mcmurray, MD,FACC Dr. Catha Nottingham, M.D.  Electronically Signed by Domingo Mend M.D. on 07/29/2010 07:54:23 AM

## 2010-08-03 ENCOUNTER — Encounter: Payer: Self-pay | Admitting: Cardiology

## 2010-08-14 ENCOUNTER — Ambulatory Visit: Payer: Self-pay | Admitting: Cardiology

## 2010-08-20 NOTE — Procedures (Signed)
Summary: Holter and Event/  CARDIONET END OF SERVICE SUMMARY REPORT  Holter and Event/  CARDIONET END OF SERVICE SUMMARY REPORT   Imported By: Bartholomew Boards 08/12/2010 14:57:58  _____________________________________________________________________  External Attachment:    Type:   Image     Comment:   External Document  Appended Document: Holter and Event/  CARDIONET END OF SERVICE SUMMARY REPORT Normal  Appended Document: Holter and Event/  CARDIONET END OF SERVICE SUMMARY REPORT Patient informed of the above.

## 2010-08-25 LAB — CBC
Hemoglobin: 12.5 g/dL (ref 12.0–15.0)
MCH: 30.5 pg (ref 26.0–34.0)
MCHC: 34.9 g/dL (ref 30.0–36.0)
MCV: 87.3 fL (ref 78.0–100.0)
RBC: 4.1 MIL/uL (ref 3.87–5.11)

## 2010-08-25 LAB — DIFFERENTIAL
Eosinophils Absolute: 0.2 10*3/uL (ref 0.0–0.7)
Eosinophils Relative: 3 % (ref 0–5)
Lymphs Abs: 1.9 10*3/uL (ref 0.7–4.0)
Monocytes Relative: 9 % (ref 3–12)

## 2010-08-25 LAB — BASIC METABOLIC PANEL
CO2: 29 mEq/L (ref 19–32)
Chloride: 106 mEq/L (ref 96–112)
Creatinine, Ser: 0.75 mg/dL (ref 0.4–1.2)
GFR calc Af Amer: >60 mL/min (ref >60)
Glucose, Bld: 133 mg/dL — ABNORMAL HIGH (ref 70–99)
Sodium: 139 mEq/L (ref 135–145)

## 2010-09-01 LAB — URINALYSIS, ROUTINE W REFLEX MICROSCOPIC
Nitrite: NEGATIVE
Specific Gravity, Urine: 1.02 (ref 1.005–1.030)
Urobilinogen, UA: 0.2 mg/dL (ref 0.0–1.0)

## 2010-09-01 LAB — WOUND CULTURE

## 2010-09-01 LAB — URINE CULTURE

## 2010-09-01 LAB — URINE MICROSCOPIC-ADD ON

## 2010-09-14 ENCOUNTER — Encounter: Payer: Self-pay | Admitting: *Deleted

## 2010-09-15 ENCOUNTER — Encounter: Payer: Self-pay | Admitting: Cardiology

## 2010-09-15 ENCOUNTER — Ambulatory Visit (INDEPENDENT_AMBULATORY_CARE_PROVIDER_SITE_OTHER): Payer: MEDICARE | Admitting: Cardiology

## 2010-09-15 ENCOUNTER — Encounter: Payer: Self-pay | Admitting: *Deleted

## 2010-09-15 VITALS — BP 159/88 | HR 73 | Ht 63.0 in | Wt 128.0 lb

## 2010-09-15 DIAGNOSIS — I10 Essential (primary) hypertension: Secondary | ICD-10-CM

## 2010-09-15 DIAGNOSIS — I25810 Atherosclerosis of coronary artery bypass graft(s) without angina pectoris: Secondary | ICD-10-CM

## 2010-09-15 DIAGNOSIS — I509 Heart failure, unspecified: Secondary | ICD-10-CM

## 2010-09-15 DIAGNOSIS — Z9889 Other specified postprocedural states: Secondary | ICD-10-CM

## 2010-09-15 DIAGNOSIS — I5032 Chronic diastolic (congestive) heart failure: Secondary | ICD-10-CM

## 2010-09-15 DIAGNOSIS — R0602 Shortness of breath: Secondary | ICD-10-CM

## 2010-09-15 MED ORDER — AMLODIPINE BESYLATE 5 MG PO TABS: 5.0000 mg | ORAL_TABLET | Freq: Every day | ORAL | Status: DC

## 2010-09-15 MED ORDER — CHLORTHALIDONE 25 MG PO TABS: 25.0000 mg | ORAL_TABLET | Freq: Every day | ORAL | Status: DC

## 2010-09-15 MED ORDER — NITROGLYCERIN 0.4 MG SL SUBL: 0.4000 mg | SUBLINGUAL_TABLET | SUBLINGUAL | Status: DC | PRN

## 2010-09-15 NOTE — Patient Instructions (Signed)
1.  Begin Amlodipine 5mg  daily 2.  Begin Chlorthalidone 25mg  daily 3.  Your physician recommends that you go to the [facility] for lab work in 7-10 days for BMET  4.  If the results of your test are normal or stable, you will receive a letter.  If they are abnormal, the nurse will contact you by phone. 5.  Nurse visit in one week for blood pressure check and orthostatics. 6.  Your physician wants you to follow up in:  3 months.  You will receive a reminder letter in the mail one-two months in advance.  If you don't receive a letter, please call our office to schedule the follow up appointment

## 2010-09-23 ENCOUNTER — Ambulatory Visit (INDEPENDENT_AMBULATORY_CARE_PROVIDER_SITE_OTHER): Payer: MEDICARE | Admitting: *Deleted

## 2010-09-23 ENCOUNTER — Encounter: Payer: Self-pay | Admitting: *Deleted

## 2010-09-23 VITALS — BP 135/83 | HR 88

## 2010-09-23 DIAGNOSIS — Z9889 Other specified postprocedural states: Secondary | ICD-10-CM | POA: Insufficient documentation

## 2010-09-23 DIAGNOSIS — I10 Essential (primary) hypertension: Secondary | ICD-10-CM

## 2010-09-23 DIAGNOSIS — I5032 Chronic diastolic (congestive) heart failure: Secondary | ICD-10-CM | POA: Insufficient documentation

## 2010-09-23 NOTE — Assessment & Plan Note (Signed)
Hypertension: Uncontrolled. We will add chlorthalidone 25 mg daily to the patient's medical regimen including amlodipine 5 mg by mouth daily. The patient will come back for RN visit next week as well as followup laboratory work including an electrolyte panel. Currently she is not on a very good antihypertensive medical regimen.

## 2010-09-23 NOTE — Progress Notes (Signed)
Pt presents for nurse visit for orthostatic blood pressures per recent office visit. Pt states she gets really SOB with exertion and gives out easily. She also has had some dizziness.  Pt brought home BP readings.

## 2010-09-23 NOTE — Progress Notes (Signed)
HPI The patient was recently discharged from [facility] with altered mental status secondary to hypertensive urgency. There was question whether the patient had atrial fibrillation and she was set up for an outpatient event monitor. I reviewed the cardiac monitor and the patient was in normal sinus rhythm with no significant arrhythmias. The study was performed in February of 2012. She also had an echocardiogram done several months ago which showed normal ejection fraction and only mild mitral regurgitation. The patient has a history of diabetes mellitus, stomach cancer, with a history of stromal tumor status post partial gastrectomy 2001. Chronic pain syndrome hyperlipidemia and CVA in 2009. From a cardiac standpoint she has a history of chronic diastolic heart failure as well as prior coronary bypass surgery. The patient had four-vessel bypass grafting in 2006. Again as noted above she has a normal ejection fraction. The patient states that her blood pressure remains poorly controlled. There have been times at home that her systolic blood pressures greater than 200 mm of mercury. This has been associated with a sharp pain around her chest and headaches. For unclear reasons her hydrochlorothiazide was discontinued. I reviewed her laboratory work from her recent hospitalization and her renal function is within normal limits. She did have a slight hyponatremia which self corrected. The patient states that she feels poorly she has no energy and gets short of breath on minimal exertion.  Allergies  Allergen Reactions  . Codeine     REACTION: Hives    Current Outpatient Prescriptions on File Prior to Visit  Medication Sig Dispense Refill  . Azelastine HCl (ASTEPRO) 0.15 % SOLN by Nasal route as needed.        . cetirizine (ZYRTEC) 10 MG tablet Take 10 mg by mouth daily.        . clopidogrel (PLAVIX) 75 MG tablet Take 75 mg by mouth daily.        Marland Kitchen lisinopril (PRINIVIL,ZESTRIL) 10 MG tablet  Take 10 mg by mouth 2 (two) times daily.        Marland Kitchen LORazepam (ATIVAN) 1 MG tablet Take 1 mg by mouth 2 (two) times daily as needed.        . metoprolol (LOPRESSOR) 50 MG tablet Take 50 mg by mouth 2 (two) times daily.        . Multiple Vitamins-Minerals (CENTRUM SILVER PO) Take by mouth daily at 8 pm.        . oxyCODONE-acetaminophen (PERCOCET) 5-325 MG per tablet Take 1 tablet by mouth 2 (two) times daily.        . ranitidine (ZANTAC) 150 MG capsule Take 150 mg by mouth 2 (two) times daily.        . simvastatin (ZOCOR) 20 MG tablet Take 20 mg by mouth at bedtime.          Past Medical History  Diagnosis Date  . Pure hypercholesterolemia   . Type II or unspecified type diabetes mellitus without mention of complication, not stated as uncontrolled   . Unspecified essential hypertension   . Unspecified transient cerebral ischemia   . Coronary atherosclerosis of native coronary artery     four-vessel CABG, 7/06, ... normal Cardiolite; EF 79%, 8/10  . PUD (peptic ulcer disease)   . Osteoarthritis   . GERD (gastroesophageal reflux disease)   . Hypothyroidism 06/14/2009  . Arthritis     Past Surgical History  Procedure Date  . Coronary artery bypass graft     . four-vessel CABG, 7/06, ... normal Cardiolite; EF  79%, 8/10  . Partial thyroid surgery   . Cataract extraction   . Partial gastrectomy [facility]  . Abdominal hysterectomy     Family History  Problem Relation Age of Onset  . Heart attack Brother 42    History   Social History  . Marital Status: Widowed    Spouse Name: N/A    Number of Children: N/A  . Years of Education: N/A   Occupational History  . RETIRED    Social History Main Topics  . Smoking status: Never Smoker   . Smokeless tobacco: Never Used  . Alcohol Use: No  . Drug Use: No  . Sexually Active: Not on file   Other Topics Concern  . Not on file   Social History Narrative  . No narrative on file   Review of systems:Pertinent  positives as outlined above. The remainder of the 18  point review of systems is negative  PHYSICAL EXAM BP 159/88  Pulse 73  Ht 5\' 3"  (1.6 m)  Wt 128 lb (58.06 kg)  BMI 22.67 kg/m2  General: Well-developed, well-nourished in no distress Head: Normocephalic and atraumatic Eyes:PERRLA/EOMI intact, conjunctiva and lids normal Ears: No deformity or lesions Mouth:normal dentition, normal posterior pharynx Neck: Supple, no JVD.  No masses, thyromegaly or abnormal cervical nodes Lungs: Normal breath sounds bilaterally without wheezing.  Normal percussion Cardiac: regular rate and rhythm with normal S1 and S2, no S3 or S4.  PMI is normal.  No pathological murmurs Abdomen: Normal bowel sounds, abdomen is soft and nontender without masses, organomegaly or hernias noted.  No hepatosplenomegaly MSK: Back normal, normal gait muscle strength and tone normal Vascular: Pulse is normal in all 4 extremities Extremities: No peripheral pitting edema Neurologic: Alert and oriented x 3 Skin: Intact without lesions or rashes Lymphatics: No significant adenopathy Psychologic: Normal affect  ECG:NA  ASSESSMENT AND PLAN

## 2010-09-23 NOTE — Assessment & Plan Note (Signed)
No recurrent chest pain. Continue medical therapy. 

## 2010-09-24 NOTE — Progress Notes (Signed)
Larene Beach called to check response from nurse visit. Call back is [phone] or [phone]

## 2010-10-12 ENCOUNTER — Telehealth: Payer: Self-pay | Admitting: *Deleted

## 2010-10-12 DIAGNOSIS — E871 Hypo-osmolality and hyponatremia: Secondary | ICD-10-CM

## 2010-10-12 NOTE — Telephone Encounter (Signed)
Grand-daughter Larene Beach [employer]) called back again to see if MD had responded to nurse visit from 4/11.  States pt did have labs (BMET) done on Friday.  States she is having a lot of weakness since starting new meds at last OV.  BP this morning was 105/61 - has not taken morning meds yet .  Thinks combination of both meds is too much for her.  Please see labs scanned in.

## 2010-10-12 NOTE — Telephone Encounter (Signed)
Received call from [facility].  Discussed below & verbalized understanding.  Will take grand-mother to [facility] Wednesday morning.

## 2010-10-12 NOTE — Telephone Encounter (Signed)
Larene Beach notified per Dr. Percival Spanish as Dr. Dannielle Burn is out of the office - stop Chlorthalidone, 1500 cc fluid restriction daily, & repeat BMET on Wednesday.  Info left on voice mail at grand-daughter request.  Order will be faxed to the [facility].

## 2010-10-16 ENCOUNTER — Other Ambulatory Visit: Payer: Self-pay | Admitting: *Deleted

## 2010-10-16 DIAGNOSIS — E871 Hypo-osmolality and hyponatremia: Secondary | ICD-10-CM

## 2010-10-16 NOTE — Telephone Encounter (Signed)
Reviewed labs . Creatinine stable. Na improved to 134. Continue to hold chlortalidone, but will need tell daughter we need f/u on BP. Repeat another BMET in 2 weeks.

## 2010-10-16 NOTE — Telephone Encounter (Signed)
[facility] notified of below.  Will continue to keep log of readings.  Thinks the elevated bp she was having at OV was related to stress.  Having a lot of issues at home.  Now bp staying lower.  Yesterday at 4 p.m. - 95/58.  States she feels pretty good up till about 11 a.m. then gives out.  Advised her to continue holding the Chlorthalidone as previously discussed & keep log of readings.  Informed her to bring to office, but if trending lower and lower to let us know.  Grand-daughter would like to know a range of when not to give her the Amlodipine.  Very concerned about her bottoming out.  Will send order to [facility] for BMET.  Did go ahead & schedule 3 mo. f/u for July.

## 2010-10-17 NOTE — Telephone Encounter (Signed)
Would hold amlodipine for now, if she truly records BP's around  123456 systolic. She can [place] the amlodipine PRN and only give if SBP > 181mmHg.

## 2010-10-21 NOTE — Telephone Encounter (Signed)
[name] notified of below via voice mail.

## 2010-10-24 NOTE — Progress Notes (Signed)
Vital signs appear stable.  Unlikely hypertension contributing to shortness of breath.  Patient should have routine follow-up in the meanwhile she can follow-up with her primary care physician to look for other causes of dyspnea

## 2010-10-27 NOTE — Assessment & Plan Note (Signed)
[facility] OFFICE NOTE   [name], [name]                      MRN:          [id number]  DATE:11/28/2007                            DOB:          04-19-1927    REFERRING PHYSICIAN:  Deeann Saint, M.D.   HISTORY OF PRESENT ILLNESS:  The patient is a 77 year old female with a  history of coronary artery disease, status post coronary artery bypass  grafting.  The patient is doing well from the cardiac perspective.  She  denies any chest pain.  She has had some problems with kidney stones  recently.  Her blood pressure is not very well controlled.  She denies  any palpitations or syncope.  She had peripheral arterial Doppler  studies done, which were essentially within normal limits.   MEDICATIONS:  1. Metoprolol 100 mg one in the morning and half in the evening.  2. Levoxyl 112 mcg p.o. daily.  3. Enteric-coated aspirin 81 mg p.o. daily.  4. Centrum Silver.  5. Zantac 150 mg daily.  6. Glucosamine.  7. Simvastatin 40 mg p.o. daily.   PHYSICAL EXAMINATION:  VITAL SIGNS:  Blood pressure is 154/79, heart  rate 68, and weight is 124 pounds.  NECK:  Normal carotid upstroke without  carotid bruits.  LUNGS:  Clear breath sounds bilaterally.  HEART:  Regular rate and rhythm.  Normal S1 and S2.  No murmurs, rubs,  or gallops.  ABDOMEN:  Soft and nontender.  No rebound or guarding.  Good bowel  sounds.  EXTREMITIES:  No cyanosis, clubbing, or edema.  NEUROLOGIC:  The patient is alert, oriented, and grossly nonfocal.   PROBLEMS:  1. Coronary artery disease, stable.      a.     Status post 4-vessel coronary artery bypass graft in July       2006.      b.     Normal left ventricular function.  2. History of mild-to-moderate mitral regurgitation.  3. Dyslipidemia.  4. Hypertension.  5. CONTRAST DYE allergy.   PLAN:  1. The patient is doing well.  She denies any substernal chest pain.      She is stable  from cardiovascular perspective.  2. I did add Norvasc 5 mg p.o. daily for blood pressure control.  3. The patient was also given a prescription for p.r.n. nitroglycerin.      Ernestine Mcmurray, MD,FACC  Electronically Signed    GED/MedQ  DD: 11/28/2007  DT: 11/29/2007  Job #: [id number]   cc:   Deeann Saint, M.D.  Jerene Bears, MD

## 2010-10-27 NOTE — Assessment & Plan Note (Signed)
[place] OFFICE NOTE   [name], [name]                      MRN:          [id number]  DATE:04/21/2007                            DOB:          March 02, 1927    REFERRING PHYSICIAN:  Deeann Saint, M.D.   HISTORY OF PRESENT ILLNESS:  The patient is a 77 year old female with  history of coronary artery disease, status post coronary artery bypass  grafting.  The patient, from a cardiovascular perspective reports no  chest pain, no shortness of breath, no orthopnea, PND.  The patient did  stop Crestor and changed to Zocor due to presumed myalgias.  The patient  also reports burning legs.  She reports no definite claudication,  however.  The patient denies any palpitations or syncope.   MEDICATIONS:  1. Levoxyl 112  mcg p.o. daily.  2. Enteric coated aspirin 81 mg p.o. daily.  3. Centrum Silver.  4. Zantac  150 mg  p.o. b.i.d.  5. Metoprolol 100 mg in the morning and metoprolol 50 mg in the      evening.  6. Zocor.  7. Ativan 1 mg p.o. daily.   PHYSICAL EXAMINATION:  VITAL SIGNS:  Blood pressure 150/77, heart rate  67, weight is 126 pounds.  NECK:  Normal carotid upstroke, no carotid bruits.  LUNGS:  Clear breath sounds bilaterally.  HEART:  Regular rate and rhythm with normal S1 and S2.  ABDOMEN:  Soft.  EXTREMITIES:  No cyanosis, clubbing or edema.  Peripheral pulses are  palpable.  NEUROLOGICAL:  Patient is alert, oriented and grossly nonfocal.   PROBLEM LIST:  1. Coronary artery disease, stable.      a.     Status post four vessel coronary artery bypass grafting,       July, 2006.      b.     Normal left ventricular function.  2. History of mild to moderate mitral regurgitation.  3. Dyslipidemia.  4. Hypertension.  5. CONTRAST DYE ALLERGY.  6. Rule out diabetic neuropathy.   PLAN:  1. The patient has been scheduled for venous Doppler's, although I      suspect her lower extremity  burning could be secondary to diabetic      neuropathy.  2. From a cardiovascular perspective, the patient is stable and      reports no substernal chest pain.  There is no definite indication      for stress testing at the present time.     Ernestine Mcmurray, MD,FACC  Electronically Signed    GED/MedQ  DD: 04/30/2007  DT: 05/01/2007  Job #: [id number]   cc:   Alveria Apley, M.D.

## 2010-10-27 NOTE — H&P (Signed)
NAMEJU, AGREDA NO.:  [id number]   MEDICAL RECORD NO.:  [id number]          PATIENT TYPE:  EMS   LOCATION:  MAJO                         FACILITY:  [facility]   PHYSICIAN:  Karlyn Agee, M.D. DATE OF BIRTH:  13-Jul-1926   DATE OF ADMISSION:  05/28/2008  DATE OF DISCHARGE:                              HISTORY & PHYSICAL   PRIMARY CARE PHYSICIAN:  Dr. Woody Seller in [place].   CARDIOLOGIST:  Dr. Dannielle Burn with [employer].   CHIEF COMPLAINT:  Confusion, slurred speech and difficulty swallowing,  lasting about 2 hours.   HISTORY OF PRESENT ILLNESS:  This is a 77 year old Caucasian lady with a  history of hypertension, coronary artery disease and hyperlipidemia, who  lives independently and usually drives her car to do her errands.  She  usually ambulates without assistance.  She awoke this morning and found  herself with slurred speech, difficulty swallowing when attempting to  have her breakfast and felt some confusion and some shortness of breath.  She did not have any weakness on one side or the other and was able to  call friends and her daughter and explain her symptoms.  The patient was  brought to the emergency room and evaluated, but according to the  patient some time while in the emergency room, the symptoms completely  resolved.   Caveats exist with this history because although the patient says this  started in the morning and lasted about 2 hours and ended while she was  in the emergency room, we note that the patient arrived in the emergency  room at 3:15 p.m.  Also, we are unable to get in touch with the  patient's daughter at this time to verify the history.   Additionally, the emergency room records report that the symptoms  started at 10 a.m., and continued for over 5-1/2 hours until presenting  to the emergency room.  They also note that she said she took Ativan and  Mucinex at 9 a.m., although in taking the history the patient states  says  she only takes Ativan at night.  In any event, the patient's  physical symptoms seem to have now resolved.  There was no evidence of  imaging of an acute neurological event.   The patient says she has been having a very mild headache for the past 2  days.  She has not had any chest pain, shortness of breath or  palpitations.  She does have a history of atrial fibrillation, but only  takes aspirin.  She has not been having any lower extremity edema or  lower extremity pain.  She has been having no focal weakness.  She has  been having no fever nor cough.   PAST MEDICAL HISTORY:  1. Coronary artery disease, status post CABG x4 three years ago.  2. Hypertension.  3. Hyperlipidemia.  4. Diabetes type 2.  5. Hypothyroidism.  6. Peripheral vascular disease.  7. Remote history of cancer, status post partial gastrectomy many      years ago.  8. A recent history of kidney stones, status post cystoscopy which  revealed according to the patient no problems.   MEDICATIONS:  1. Metoprolol 100 mg, unknown frequency.  2. Lisinopril 10 mg daily.  3. Simvastatin 40 mg at bedtime.  4. Aspirin 81 mg daily.  5. Levoxyl 112 mcg daily.  6. Ranitidine 150 mg twice daily.  7. Lorazepam 1 mg at bedtime.   ALLERGIES:  1. SULFA.  2. CODEINE.  3. IVP DYE.  4. IODINE CONTAINING COMPOUNDS.   SOCIAL HISTORY:  She denies tobacco, alcohol or illicit drug use.  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  She continues to drive short distances  about 2 miles each day, occasionally on the highway.   FAMILY HISTORY:  Mostly unknown and most of her relatives died of old  age she reports, however, her father did have hypertension.   REVIEW OF SYSTEMS:  Significant for mild headache for the past 2 days.  She reports blurring of vision for 2-3 years, and says she thinks she  needs glasses, however, it does not seem to prevent her driving.  She is  also positive for constipation.  She denies difficulty with  ambulation.  She denies urinary problems.  She does have mild arthritis for which she  has been prescribed Percocet, but she does not like to take it.  Other  than this, a 10-point review of systems is unremarkable.   PHYSICAL EXAMINATION:  GENERAL:  A pleasant elderly Caucasian lady  reclining in bed in no acute distress.  VITAL SIGNS:  Her temperature is 98.7, pulse 76, respirations 19, blood  pressure 169/82.  She is saturating at 97% on room air.  HEENT:  Her pupils are round and equal.  Mucous membranes pink and  anicteric.  No oral lesions.  NECK:  She has no cervical lymphadenopathy or thyromegaly.  No  jugulovenous distention.  No carotid bruit.  CHEST:  Clear to auscultation bilaterally.  CARDIOVASCULAR:  Regular rhythm.  No murmur heard.  She does have a  thoracotomy scar.  ABDOMEN:  Mildly soft and nontender.  There are no masses.  She does  have a mild laparotomy scar.  EXTREMITIES:  Without edema.  She has no calf tenderness.  Dorsalis  pedis pulses are equal +2, but she has good capillary refill.  CENTRAL NERVOUS SYSTEM:  Cranial nerves II through XII are grossly  intact and she has no focal neurologic signs.  Her power is equal  throughout.  Her gait was not tested.   LABORATORY DATA:  Her CBC is completely unremarkable with a white count  of 8.4, hemoglobin 13.1 and platelets 192.  Her eosinophil count was  slightly elevated at 8%.  Her serum chemistry is likewise completely  unremarkable.  Her glucose is slightly elevated at 113.  Her total  protein was low at 5.8.  Her albumin was slightly low at 3.0.  Otherwise, her renal and kidney function were unremarkable.  Her coags  were normal with an INR of 1.0, PTT of 30.  Chest x-ray shows no acute  process.  Chronic findings of ectasia, calcification and tortuosity of  thoracic aorta.  The lung is free of infiltrates.  Cardiac silhouette is  normal size.  CT scan of the brain shows mild age-related atrophy, no  signs of  acute infarction, mass lesion or hemorrhages.  The sinuses are  clear.   ASSESSMENT:  1. Transient ischemic attack.  2. Hypertension, uncontrolled.  3. History of hypothyroidism.  4. History of diabetes, apparently controlled on diet.  5. History of  coronary artery disease.  6. Hyperlipidemia.   PLAN:  Will bring this lady in on observation overnight.  Will give her  the benefit of an MRI of the brain.  Will try to get the exact doses of  antihypertensive from her pharmacy.  Will check her thyroid function and  lipid panel.   She is on 81 mg of aspirin.  Will start her on Aggrenox as tolerated.  Other plans as per orders.      Karlyn Agee, M.D.  Electronically Signed     LC/MEDQ  D:  05/28/2008  T:  05/28/2008  Job:  [id number]   cc:   Woody Seller, MD  Ernestine Mcmurray, MD,FACC

## 2010-10-27 NOTE — Consult Note (Signed)
[name], [name] NO.:  [id number]   MEDICAL RECORD NO.:  [id number]          PATIENT TYPE:  OBV   LOCATION:  3703                         FACILITY:  [facility]   PHYSICIAN:  Jill Alexanders, M.D.  DATE OF BIRTH:  08/17/1926   DATE OF CONSULTATION:  05/29/2008  DATE OF DISCHARGE:                                 CONSULTATION   HISTORY OF PRESENT ILLNESS:  [name] is a 77 year old, right-  handed white female, born on 1926/07/23, with a history of atrial  fibrillation, currently in normal sinus rhythm, history of coronary  artery disease, hypertension, borderline diabetes, dyslipidemia.  The  patient comes to [facility] on the day of admission with onset  of headache and slurred speech, right facial droop.  This clinically  resolved.  CT scan of the head was done showing no acute changes by  radiographic report, but my review indicates a possible evolving  subacute left centrum semiovale that is confirmed on a CT scan today.  The patient had resolution of symptoms clinically but had recurrence of  headache, nausea, vomiting, right facial droop, slurred speech that  occurred around 11:00 a.m. today while getting an MRI scan of the brain.  Code stroke was not called even after a rapid response nurse was  contacted.  However, CT scan done today confirms an evolving stroke in  the left centrum semiovale.  For this reason, the patient is not a tPA  candidate and NIH stroke scale score currently is 2.   Past medical history is significant for:  1. An evolving left centrum semiovale infarcts, small vessel event.  2. Hypertension.  3. Coronary artery disease status post 4-vessel CABG procedure in the      past.  4. Dyslipidemia.  5. Borderline diabetes.  6. Thyroid surgery in the past.  7. Partial gastrectomy for cancer in the distant past.  8. Renal calculi.  9. Cataract surgery.  10.Peripheral vascular disease.  11.Hysterectomy.  12.Gallbladder resection.  13.History of atrial fibrillation, currently in normal sinus rhythm.   The patient had allergy to SULFA drugs, CODEINE, IVP DYE, does not  currently smoke or drink.   Medications currently include:  1. Tylenol if needed.  2. The patient is on Aggrenox 1 tablet twice daily.  3. Lovenox 40 mg subcu q.24 h.  4. Toradol 30 mg IV.  5. Prinivil 5 mg daily.  6. Ativan 1 mg at night.  7. Lopressor 50 mg daily.  8. Protonix 40 mg daily.   SOCIAL HISTORY:  This patient is widowed, lives in the [place], [place] area, has 4 children, 1 child has history of heart disease.   FAMILY MEDICAL HISTORY:  Mother died at an old age of 35.  Father died  with an MI.  The patient has 6 brothers and sisters, 2 brothers have  died with an MI, 1 sister died with an MI.  No family history of cancer  or diabetes is noted.   REVIEW OF SYSTEMS:  Notable for some fevers 2-3 days prior to admission.  The patient does report  headache on the day of admission and today has  had some nausea, vomiting, does note some shortness of breath.  Denies  chest pain, palpitations of the heart.  Has some tingling associated  with nausea in both feet.  Denies problems controlling the bowels or  bladder.  Denies any dizziness or blackout episodes.   PHYSICAL EXAMINATION:  VITAL SIGNS:  Blood pressure is currently 119/70,  heart rate 65, respiratory rate 18, temperature afebrile.  GENERAL:  This patient is a fairly well-developed elderly white female,  who is alert and cooperative at the time of examination.  HEENT:  Head is atraumatic.  Eyes, pupils are round, postsurgical, react  to light.  Disks are flat bilaterally.  NECK:  Supple.  No carotid bruits noted.  RESPIRATORY:  Clear.  CARDIOVASCULAR:  Regular rate and rhythm.  No obvious murmurs or rubs  noted.  EXTREMITIES:  Without significant edema.  NEUROLOGIC:  Cranial nerves as above.  A mild right facial droop is  noted.  Mild  dysarthria is noted.  No aphasia is noted.  The patient has  good pinprick sensation of the face bilaterally.  Extraocular movements  are full.  Visual fields are full.  Motor testing reveals 5/5 strength  in all fours.  Good symmetric motor tone is noted throughout.  No drift  is seen in the arms or legs.  Pinprick, soft touch, and vibration  sensation is symmetric throughout.  The patient has good finger-nose-  finger and heel-to-shin bilaterally.  The gait was not tested.  Deep  tendon reflexes are fairly symmetric.  Toes neutral bilaterally.  No  evidence of extinction to double simultaneous stimulation is noted.   NIH stroke scale score is 2.   Laboratory values notable for sodium 140, potassium 4.6, chloride of  107, CO2 of 28, glucose of 113, BUN of 13, creatinine 0.76.  Total  bilirubin of 0.4, alkaline phosphatase of 83, SGOT of 17, SGPT of 11,  total protein of 5.8, albumin of 3.0.  Calcium of 9.0.  MB fraction of  0.5, troponin I of 0.01.  Cholesterol panel of 194, triglycerides 135,  HDL 50, LDL of 117, VLDL of 27.  Her urinalysis reveals specific gravity  1.015, pH of 6.0, otherwise unremarkable.   CT of the head and is as above.   IMPRESSION:  1. Left centrum semiovale evolving stroke with slurred speech and      dysarthria.  2. Hypertension.  3. Dyslipidemia.  4. History of atrial fibrillation, currently in normal sinus rhythm.  5. Borderline diabetes.   This patient does have risk factors for stroke.  At this point, the  patient has had a clinical transient ischemic attack event but actually  had a subacute stroke present on the CT scan of the head that was done  on admission.  This is evolving on the CT scan done today although the  patient has had recrudescence of symptoms.  The patient is currently on  Aggrenox, complains of severe headaches, may switch off Aggrenox to  Plavix, particularly given the history of coronary artery disease  previously.  We will  pursue further workup.  The patient is not a tPA  candidate due to an evolving acute/subacute stroke.  NIH stroke scale  score is 2.   PLAN:  1. MRI scan of the brain.  2. MRI angiogram of the intracranial and extracranial vessels.  3. A 2-D echocardiogram.  4. Carotid Doppler study.  5. Physical and occupational therapy evaluation.  6. Discontinue Aggrenox, switch to Plavix.  We will follow the      patient's clinical course while in house.  The patient will require      another nursing bedside swallow protocol evaluation.      Jill Alexanders, M.D.  Electronically Signed     CKW/MEDQ  D:  05/29/2008  T:  05/30/2008  Job:  [id number]   cc:   [facility]

## 2010-10-27 NOTE — Discharge Summary (Signed)
[name], [name]               ACCOUNT NO.:  [id number]   MEDICAL RECORD NO.:  [id number]          PATIENT TYPE:  INP   LOCATION:  3703                         FACILITY:  [facility]   PHYSICIAN:  [name], MDDATE OF BIRTH:  [birth year]   DATE OF ADMISSION:  05/28/2008  DATE OF DISCHARGE:  06/01/2008                               DISCHARGE SUMMARY   DISCHARGE DIAGNOSES:  1. Acute cerebrovascular accident.  2. Left corona radiata and left lenticular nucleus.  3. Intracranial atherosclerosis.  4. Dyslipidemia.  5. Dysthyroidism.  6. Borderline diabetes.  7. Hypertension, stable.  8. History of coronary artery disease status post coronary artery      bypass graft.  9. History of peripheral vascular disease.  10.History of stomach cancer status post partial gastrectomy.  11.History of kidney stones status post cystoscopy.   CONSULTS:  Neurology, [name].   RADIOLOGY:  The MRI, head on May 29, 2008, she has acute small to  moderate size infarct extending from the posterior aspect of left  lenticular nucleus superiorly into the posterior aspect of the left  corona radiata.  There is a tiny amount of associated hemorrhage, tiny  hemorrhagic infarct in the right thalamus and right basal ganglia,  remote right parietal with encephalomalacia, moderate small vessel  disease changes, and mild global atrophy without hydrocephalus.  MRA of  head and neck shows no evidence of hemodynamically significant stenosis  involved in either carotid bifurcation, very mild narrowing proximal  right vertebral artery, mild narrowing proximal left common carotid  artery, and left subclavian artery.  CT of head on May 29, 2008,  she has progression of the hypodensity in the left corona radiata  compatible with acute infarct.  No mass effect or hemorrhage.  Chest x-  ray on May 28, 2008, she has no acute process, bone osteopenia,  mild degenerative disk disease, and degenerative  spondylosis compatible  with age.  Previous median sternotomy and coronary artery bypass  grafting.   HOSPITAL COURSE:  This is a 77 year old female Caucasian lady who  presented to the emergency room with 2-hour history of confusion,  slurred speech, and difficulty swallowing.  On the day of admission, she  had moderately elevated blood pressure and her neurological signs  initially resolved within 24 hours.  However by the second day of  admission, the patient developed neurologic symptoms of dysarthria,  facial drooping, and right hand weakness on the way to having an MRI  performed.  Neurology was consulted and they put her on Plavix.  Recommended PT and OT input.  Speech Therapy did evaluate the patient,  and she passed the swallowing evaluation.  The patient is clinically  stable.  A 2-D echocardiogram on this admission revealed that she has  normal left ventricular ejection fraction of 60%, mild increased left  ventricular wall thickness, mildly calcified aortic valve, and mild  dilation of the left atrium.  There was an echocardiographic evidence  for a cardiac source of embolism.  The hypercoagulable workup showed  that antithrombin III was elevated at 143.  Lupus anticoagulant was  negative.  Factor  V Leiden was negative.  The patient was started on  Zocor 20 mg daily for dyslipidemia.  She had recurrent episodes of sinus  congestion and allergic rhinitis, and this has improved with addition of  Claritin and Flonase.  She has been assessed to be stable for discharge  by Neurology.  Her dose of Levoxyl was decreased from 112 mcg to 75 mcg  because her TSH level was noticed to be reduced.  Free T3 and T4 were  normal.  The dose of the Levoxyl is to be reviewed by primary care  physician as an outpatient.   DISPOSITION:  Home with home health followup PT and OT for right hand  weakness and right facial droop.   ACTIVITY:  To be increased slowly as tolerated.   FOLLOWUP:  She  is to follow up with her primary care physician, [name]  in the next 3-5 days.  Follow up with [name] at [facility]  in the next 4-6 weeks.  Follow up [name], Neurology in the next 2-3  months.   MEDICATIONS ON DISCHARGE:  1. Metoprolol 50 mg b.i.d. hold if heart rate is less than 60 and      blood pressure is less than 110/70.  2. Lisinopril 5 mg daily.  3. Levoxyl 75 mcg daily.  4. Plavix 75 mg daily.  5. Claritin 10 mg daily.  6. Simvastatin 20 mg daily.  7. Flonase 1 spray  b.i.d. as needed.  8. Ranitidine 150 mg b.i.d.  9. Lorazepam 1 mg at bedtime.   PHYSICAL EXAMINATION:  GENERAL:  Today, she is an elderly lady, not in  acute distress.  VITAL SIGNS:  Temperature 98, pulse is 100, respiratory rate is 18, and  blood pressure 110/90.  HEENT:  Pupils are equal and reacting to light and accommodation.  Head  is atraumatic.  Mucous membranes are moist.  NEUROLOGIC:  She is alert and oriented x3.  There is no aphasia.  She  has minimal dysarthria.  Mild right facial droop.  Right hand  monoparesis.  No focal leg weakness.  NECK:  Supple.  There is no thyromegaly or elevated JVD.  No carotid  bruit.  CHEST:  Clinically, clear.  No rhonchi or rales.  HEART:  Sounds 1 and 2.  Regular.  ABDOMEN:  Soft and nontender.  Bowel sounds present.  Peripheral pulses  present.  No pedal edema.   LABORATORY DATA:  Protein S and protein C normal.  ESR is 28, mildly  elevated.  RPR is negative.  Free T4 1.4, normal.  Free T3 of 2.7,  normal.  TSH 0.025, low.  Hemoglobin A1c 6.7.  Lipid profile total  cholesterol 194, LDL cholesterol 1.7, and HDL 50.  WBC 8.4, hematocrit  40, and platelet count is 192.  Chemistry, sodium 140, potassium 4.6,  chloride 107, bicarbonate 28, glucose 113, BUN 13, and creatinine 0.7.   Note, home diet to be low sodium, heart healthy, low cholesterol, 1800  calorie ADA.   The patient's illness, medication, treatment plan, and discharge plan   explained to her and her family.  They verbalized understanding.   DISCHARGE TIME:  Greater than 30 minutes.     Theresa Nora, MD  Electronically Signed    MIO/MEDQ  D:  06/01/2008  T:  06/02/2008  Job:  [id number]

## 2010-10-27 NOTE — Assessment & Plan Note (Signed)
[place] OFFICE NOTE   ROSENDA, HORWEDEL                      MRN:          [id number]  DATE:07/03/2008                            DOB:          31-Aug-1926    HISTORY OF PRESENT ILLNESS:  The patient is a 77 year old female with a  history of coronary artery disease status post coronary bypass graft in  May 2006.  The patient was recently hospitalized to [facility]  with TIAs.  She underwent a hypercoagulable workup, which was reportedly  negative.  She had a CVA of the left corona radiata, left lenticular  nucleus.  She was found to have intracranial atherosclerosis.  I do not  think she had a CTA done of the aortic arch, however.   She was placed on the Plavix and was seen by Neurology.  She has had no  recurrent symptoms.  Her blood pressure has been difficult to control.   MEDICATIONS:  1. Metoprolol 50 mg p.o. b.i.d.  2. Centrum Silver.  3. Zantac 150 mg p.o. b.i.d.  4. Simvastatin 40 mg daily.  5. Levoxyl 75 mcg.  6. Plavix 75 mg p.o. daily.  7. Lisinopril 10 mg p.o. daily   PHYSICAL EXAMINATION:  VITAL SIGNS:  Blood pressure 150/88, heart rate  64, weight 119 pounds.  NECK:  Normal carotid stroke.  No carotid bruits.  LUNGS:  Clear breath sounds bilaterally.  HEART:  Regular rate and rhythm.  Normal S1 and S2.  No murmur, rubs, or  gallops.  ABDOMEN:  Soft, nontender.  No rebound or guarding.  Good bowel sounds.  EXTREMITIES:  No cyanosis, clubbing, or edema.  NEURO:  The patient is alert, oriented, and grossly nonfocal.   PROBLEMS:  1. Status post CVA.  See details above.  2. Coronary artery, stable.      a.     Status post four-vessel coronary artery bypass graft July       2006.      b.     Normal LV function.  3. Mild-to-moderate mitral regurgitation.  4. Dyslipidemia.  5. Hypertension.  6. Contrast dye allergy.   PLAN:  1. The patient is better blood pressure  controlled and I have added      hydrochlorothiazide to her medical regimen.  2. I will ask also the patient to take blood pressure in both arms as      there is a discrepancy of 15 mmHg between the left and the right      arm.  3. The patient will need to continue on lifelong aspirin, Plavix given      her CVA.  4. From a cardiac standpoint, however, the patient is stable and she      can continue her current medical therapy.     Ernestine Mcmurray, MD,FACC  Electronically Signed    GED/MedQ  DD: 07/03/2008  DT: 07/04/2008  Job #: [id number]

## 2010-10-27 NOTE — Assessment & Plan Note (Signed)
[place] OFFICE NOTE   JYSSICA, BOTA                      MRN:          [id number]  DATE:12/31/2008                            DOB:          29-Jul-1926    PRIMARY CARDIOLOGIST:  Ernestine Mcmurray, MD, [facility]   PRIMARY CARE PHYSICIAN:  Jerene Bears, MD   REASON FOR VISIT:  Scheduled followup.   HISTORY OF PRESENT ILLNESS:  I am seeing Ms. Lumia today in Dr.  Arlina Robes absence.  She has a history of multivessel cardiovascular  disease, status post coronary artery bypass grafting in July 2006  associated with normal left ventricular systolic function, and mild-to-  moderate mitral regurgitation.  She also has a history of a stroke  involving the left corona radiata and left lenticular nucleus  approximately 6 months ago and is being managed with Plavix at this time  following Neurology evaluation.  She saw Dr. Dannielle Burn last in January of  this year.  More recently within the last few weeks, she has been  experiencing weakness and general lightheadedness.  She called the  office and asked to arrange a followup visit.  In speaking with her  today, she seems to indicate a feeling of left-sided chest heaviness  that lasted approximately 2 hours, 3 weeks ago, that was associated with  one of these episodes of weakness and lightheadedness.  She states that  she actually used a sublingual nitroglycerin tablet with this and had  some relief, although not complete resolution.  Otherwise, she has not  had any clear exertional chest pain, although she continues to have  episodic weakness and lightheadedness, perhaps on a once per week basis.  She has no major sense of palpitations with these episodes and has had  no frank syncope.  She reports compliance with her medications.  Her  electrocardiogram today is stable showing sinus rhythm at 61 beats per  minute with a right bundle-branch block pattern and small inferior  Q-  waves.  There is no change compared to prior tracing from May of last  year.  She has not undergone any recent ischemic evaluation.  Echocardiography in December of 2009 demonstrated a left ventricular  ejection fraction of 60% with mild left ventricular hypertrophy and  mildly dilated left atrium.   ALLERGIES:  SULFA DRUGS and CODEINE.   PRESENT MEDICATIONS:  1. Centrum Silver once daily.  2. Zantac 150 mg p.o. b.i.d.  3. Levoxyl 75 mcg p.o. daily.  4. Plavix 75 mg p.o. daily.  5. Hydrochlorothiazide 12.5 mg p.o. daily.  6. Lisinopril 10 mg p.o. b.i.d.  7. Simvastatin 20 mg p.o. daily.  8. Lopressor 50 mg p.o. b.i.d.  9. Allegra 180 mg p.o. daily.  10.Ativan 1 mg p.o. b.i.d. p.r.n.  11.Percocet p.r.n.   REVIEW OF SYSTEMS:  As outlined above.  She reports decrease in energy.  She is due to see Dr. Woody Seller later this week for a regular visit.  Denies  any major problems with her thyroid status, requiring adjustments in  Levoxyl.  TSH from December was actually low at 0.025.  Otherwise  systems reviewed and are negative.   PHYSICAL EXAMINATION:  VITAL SIGNS:  Blood pressure is 123/78, heart  rate is 64 and regular, and weight is 120.4 pounds, which is stable.  GENERAL:  The patient is normally nourished appearing, thin elderly  woman in no acute distress.  HEENT:  Lids are puffy.  Conjunctivae are clear.  Oropharynx are clear.  NECK:  Supple.  No loud carotid bruits.  No thyromegaly.  LUNGS:  Clear with diminished breath sounds.  Nonlabored breathing noted  at rest,.  CARDIAC:  Regular rate and rhythm.  No loud systolic murmur or  pericardial rub.  ABDOMEN:  Soft, nontender.  Normoactive bowel sounds.  No hepatomegaly.  EXTREMITIES:  No frank pitting edema.  Distal pulses are 2+.  SKIN:  Warm and dry.  MUSCULOSKELETAL:  No kyphosis noted.  NEUROPSYCHIATRIC:  The patient is alert and oriented x3.  Affect is  appropriate.   IMPRESSION AND RECOMMENDATIONS:  Episodic weakness  and lightheadedness  as well as a prior prolonged episode of chest heaviness approximately  3 weeks ago.  This is on a baseline of multivessel cardiovascular  disease requiring bypass surgery in July 2006 as well as cerebrovascular  disease, status post stroke approximately 6 months ago.  She, otherwise,  denies any major speech deficits or weakness.  She has had no obvious  palpitations.  There has been no recent ischemic evaluation.  I reviewed  these issues with her today.  We have planned to proceed with a 7-day  CardioNet monitor to exclude possible transient dysrhythmia (mainly  atrial fibrillation), and also a Lexiscan Cardiolite on medical therapy  to reassess for ischemia.  She is due to see Dr. Woody Seller later this week  and can have her thyroid checked at that time.  We will otherwise plan  to have her come back to see Dr. Dannielle Burn in the office over the next  weeks to discuss these results and plan further management.  No other  specific medication adjustments were made today.     Satira Sark, MD  Electronically Signed    SGM/MedQ  DD: 12/31/2008  DT: 01/01/2009  Job #: [id number]   cc:   Jerene Bears, MD

## 2010-10-30 NOTE — Op Note (Signed)
NAMESERRENA, [name] NO.:  [id number]   MEDICAL RECORD NO.:  [id number]          PATIENT TYPE:  INP   LOCATION:  2311                         FACILITY:  [facility]   PHYSICIAN:  Lanelle Bal, MD    DATE OF BIRTH:  1927/04/20   DATE OF PROCEDURE:  01/11/2005  DATE OF DISCHARGE:                                 OPERATIVE REPORT   PREOPERATIVE DIAGNOSIS:  Coronary occlusive disease, with unstable angina.   POSTOPERATIVE DIAGNOSIS:  Coronary occlusive disease, with unstable angina.   SURGICAL PROCEDURE:  Coronary artery bypass grafting x 4, with left internal  mammary to the left anterior descending coronary artery, sequential reverse  saphenous vein graft to the first and second obtuse marginal, reverse  saphenous vein graft to the distal right coronary artery, with right  Endovein harvesting.   SURGEON:  Lanelle Bal, MD.   FIRST ASSISTANT:  John Giovanni, P.A.-C.   BRIEF HISTORY:  The patient is a 77 year old female who presented with  unstable anginal symptoms.  She had previously had a history of gastric  carcinoma which was resected five years ago without evidence of recurrence.  She had had a question of a nodule on the chest x-ray, and a right upper  lobe CT scan did not confirm any evidence of a pulmonary nodule.  Coronary  artery bypass grafting was recommended because of the patient's three-vessel  coronary disease.  She agreed and signed informed consent.   DESCRIPTION OF THE PROCEDURE:  With Swan-Ganz and arterial line monitors in  place, the patient underwent general endotracheal anesthesia without  incident.  The skin of the chest and legs was prepped with Betadine and  draped in the usual sterile manner.  Using the Guidant Endovein harvesting  system, vein was harvested from the right thigh and right lower leg.  Median  sternotomy was performed.  Left internal mammary artery was dissected down  as a pedicle graft.  The distal artery was divided  and had good free flow.  Pericardium was opened.  Overall ventricular function appeared preserved.  The patient was systemically heparinized.  The ascending aorta and the right  atrium were cannulated, and the aortic root vent cardioplegia needle was  introduced into the ascending aorta.  The patient was placed on  cardiopulmonary bypass, 2.4 liters/minute/sq m.  Sites of anastomosis were  inspected and dissected at the epicardium.  Aortic cross-clamp was applied,  and 500 cc of cold blood potassium cardioplegia was administered, with rapid  diastolic arrest of the heart.  Myocardial septal temperature was monitored  throughout the cross-clamp.  Attention was turned first to the distal right  coronary artery, which was opened and admitted a 1.5 mm probe.  Using a  running 7-0 Prolene, distal anastomosis was performed.  Attention was then  turned to the obtuse marginal vessels.  These were partially  intramyocardial.  The very distal circumflex branch was very small, less  than 1 mm branches arising out of the AV groove.  The first obtuse marginal  was opened and was approximately 1.3-1.4 mm in size.  Using a diamond type,  side-to-side anastomosis was carried out with a running 8-0 Prolene.  The  distal extent of the same vein was then carried a short distance to the  second obtuse marginal which was opened and was of similar size and was  intramyocardial.  Using a running 8-0 Prolene, the distal anastomosis was  performed.  Attention was then turned to the left anterior descending  coronary artery, which was opened in the midportion.  She did have evidence  of diffuse distal disease.  Using a running 8-0 Prolene, the left internal  mammary was anastomosed to the left anterior descending coronary artery.  With the cross-clamp still in place, two punch aortotomies were performed on  the ascending aorta, and each of the two vein grafts were anastomosed to the  ascending aorta.  Air was  evacuated from the grafts and the ascending aorta  as the cross-clamp was removed.  Total cross-clamp time was 77 minutes.  The  patient spontaneously converted to a sinus rhythm.  Two atrial and two  ventricular pacing wires were applied.  The patient was then ventilated and  weaned from cardiopulmonary bypass without difficulty.  Sh was decannulated  in the usual fashion.  Protamine sulfate was administered.  With the  operative field hemostatic, two atrial and two ventricular pacing wires were  applied.  Graft markers were applied.  A left pleural tube and two  mediastinal tubes were left in place.  Sternum was closed with #6 stainless  steel wire.  Fascia was closed with interrupted 0 Vicryl, running 3-0 Vicryl  in the subcutaneous tissue, 4-0 subcuticular stitch in the skin edges.  Dry  dressings were applied.  Sponge and needle count was reported as correct at  the conclusion of the procedure.  The patient tolerated the procedure  without obvious complication and was transferred to the surgical intensive  care unit for further postoperative care.       EG/MEDQ  D:  01/12/2005  T:  01/12/2005  Job:  [id number]   cc:   Kirk Ruths, M.D. [facility]

## 2010-10-30 NOTE — H&P (Signed)
[name], [name] NO.:  [id number]   MEDICAL RECORD NO.:  [id number]          PATIENT TYPE:  INP   LOCATION:  A209                          FACILITY:  [facility]   PHYSICIAN:  Vanetta Mulders. Dechurch, M.D.DATE OF BIRTH:  March 07, 1927   DATE OF ADMISSION:  11/03/2005  DATE OF DISCHARGE:  LH                                HISTORY & PHYSICAL   A 77 year old Caucasian female, with known coronary artery disease status  post multivessel bypass in August2006 at [facility], who has  actually been doing well up until today when she noted during cardiac rehab  left shoulder pain.  She was noted to be hypertensive.  This shoulder pain  persisted to the point she felt funny, denied any shortness of breath or  chest pain per say.  She was noted to be markedly hypertensive with a  systolic greater than [id number] and diastolic of 89.  She was brought to the  emergency room for further evaluation where she received sublingual  nitroglycerin and the pain gradually dissipated.  She apparently had an  episode last p.m. of substernal burning radiating into her neck with nausea  and vomiting which persisted.  She was attributing it to eating raw onions  but the symptoms were more severe than she had experienced in the past.  She  never has heartburn or nausea.  She has remained active.  She lives by  herself.  She denies any tobacco abuse or alcohol.  She has a significant  other.  She continues to drive and remains independent.  She is widowed but  has a significant other.  She has no history of alcohol or tobacco abuse.   Past medical history is remarkable for coronary artery bypass grafting by  Dr. Servando Snare which brought her to [facility] after she presented  with substernal left-sided chest pain radiating to the left shoulder and  shortness of breath.  She has a history of arthritis, goiter and  hypothyroidism.  History of a partial gastrectomy for a stromal tumor of the  stomach done at [facility].  Diabetes mellitus diet  controlled, hyperlipidemia and hypertension.  Her primary care physician is  Dr. Woody Seller at [facility] and she is followed by [facility].   MEDICATIONS:  1.  Lisinopril 10 b.i.d.  2.  Lopressor 50 b.i.d. versus Toprol 100 daily unclear.  3.  Levoxyl 112 mcg daily.  4.  Prilosec 20 daily.  5.  Aspirin 81 daily.  6.  Allegra 180 daily.  7.  Multivitamin daily.   REVIEW OF SYSTEMS:  She is independent, walks.  She has no change in her  exercise tolerance or shortness of breath with exertion.  She has had no  chest pain or other symptoms.  Overall, she has been quite stable since her  bypass.  No GI or GU complaints except as noted above.  No fever, chills.  No weight gain, weight loss.  She is now complaining of some rhinorrhea  associated with the oxygen but otherwise aside from some seasonal allergies,  no changes.  PHYSICAL EXAM:  Reveals a well-developed, well-nourished female alert and  appropriate.  She has some periorbital edema. She appears to have some  xanthomatous changes about her eyes.  Lungs were clear to auscultation.  Heart is regular.  No murmur or gallop was noted.  The abdomen is soft and  nontender.  Extremities without clubbing, cyanosis.  She has no edema.  Breast exam is deferred.  Neurologic exam is completely intact.   ASSESSMENT/PLAN:  Chest and shoulder pain associated with exercise in a  patient with known vascular disease.  The patient is admitted for monitoring  and cardiac enzymes.  She has been seen by [facility] who will  proceed with Cardiolite-Myoview in the a.m. if her enzymes are unremarkable.  If her enzymes are consistent with myocardial injury, then we will proceed  with catheterization.  At this point, we will continue her usual  medications, clarify her beta blocker dose and hydrochlorothiazide has been  added by a cardiologist.  Current labs  were unremarkable.  Initial troponin  is 0.04 in the setting of a normal CK of 21.  The patient notes she had  lipid testing done yesterday.  We will see if we can get those results and  her family member has gone to retrieve her medications to ensure that she is  on the appropriate regimen.  I suspect that she has been on a Statin at some  point.  She is not entirely sure of the medications which she takes.      Vanetta Mulders Hillery Jacks, M.D.  Electronically Signed     FED/MEDQ  D:  11/03/2005  T:  11/03/2005  Job:  [id number]

## 2010-10-30 NOTE — Consult Note (Signed)
NAMEJON, [name] NO.:  [id number]   MEDICAL RECORD NO.:  [id number]          PATIENT TYPE:  INP   LOCATION:  A209                          FACILITY:  [facility]   PHYSICIAN:  Scarlett Presto, M.D.   DATE OF BIRTH:  01/02/1927   DATE OF CONSULTATION:  11/03/2005  DATE OF DISCHARGE:                                   CONSULTATION   PRIMARY:  Dr. Woody Seller in [place].   CARDIOLOGIST:  Dr. Dannielle Burn.   HISTORY OF PRESENT ILLNESS:  Mrs. Prock is a 77 year old woman with known  coronary disease status post bypass surgery in July of 2006.  She was in  cardiac rehab exercising, had the onset of shoulder pain, was evaluated  while exercising, found to be hypertensive to the blood pressure of 200/89.  She was transported to the emergency department, given some sublingual  nitroglycerin with resolution of her discomfort.  Blood pressure went back  down to 160/90 and she feels much better with no shortness of breath, no  chest pain, no diaphoresis, no palpitations.  She has not had any discomfort  since her bypass surgery.  She had been able to exercise without any  trouble.  Her blood pressure had been previously very well controlled.   Her past medical history is significant for coronary artery disease.  She  had a heart catheterization in July 2006 that showed significant LAD and  circumflex disease with normal LV systolic function.  Also had disease in  her right coronary artery.  She was revascularized with a bypass surgery in  July 2006 with a LIMA to her LAD, saphenous vein graft to the obtuse  marginal one, jump graft to the obtuse marginal two and a saphenous vein  graft to the right coronary artery.  Her ejection fraction the first of the  year was 55-60% with no regional wall motion abnormalities, mild mitral  regurgitation, biatrial enlargement.  She has hypertension, hyperlipidemia  and degenerative joint disease, a history of hypothyroidism and history of  stomach  cancer.  I think she has had a resection in the past.   MEDICATIONS PRIOR TO ADMISSION:  1.  Lisinopril 10 mg b.i.d.  2.  Metoprolol 50 mg b.i.d.  3.  Synthroid 112 mcg once a day.  4.  Omeprazole 20 mg a day.  5.  Aspirin 81 mg a day.  6.  Allegra 180 mg day.  7.  Multivitamin 1 pill once a day.   SOCIAL HISTORY:  She lives in [place].  She is recently widowed.  She  does not smoke, drink or use illicit drugs.   FAMILY HISTORY:  Her mother died of old age.  Her father died of coronary  artery disease at age 16.  She has two brothers and a sister who passed  away, all of myocardial infarctions.  She has two sisters and a brother who  are healthy.   REVIEW OF SYSTEMS:  She denies any fever, chills.  No headaches, no sinus  tenderness or sinus discharge.  No rashes or lesions.  She did have the  chest pain and  dyspnea on exertion when she exercised today but otherwise  was pretty much without significant symptoms.  No PND, no orthopnea, no  lower extremity edema, no palpitations, no cough, no wheezing.  No urinary  frequency or dysuria.  No weakness or numbness.  She does have mild  arthralgias from her degenerate joint disease.  No nausea, vomiting,  diarrhea, bright red blood per rectum, melena.  She does have  gastroesophageal reflux symptoms.  All the remainder of her review of  systems is negative.   PHYSICAL EXAM:  She is a well-developed, pleasant, elderly white female in  no apparent distress.  She is alert and oriented x4.  Her pulse is 58,  respirations are 12, blood pressure 143/71.  She is saturating 100% on room  air.  Examination of the head, ears, eyes, nose and throat is unremarkable.  The neck is supple.  There is no jugular venous distension or carotid  bruits.  Her cardiovascular exam is regular with an S4.  No murmurs are  noted.  Lungs were clear to auscultation bilaterally.  Her skin is without  significant rashes.  GU and breast and rectal exam were  deferred.  Abdomen  is soft, nontender, normoactive bowel sounds.  Lower extremities without  clubbing, cyanosis or edema.  There are no bruits noted in the femoral area.  Neurologic and musculoskeletal exams are grossly nonfocal.  Chest x-ray  shows no acute disease.   Electrocardiogram shows sinus rhythm at a rate of 59, right bundle branch  block, left axis deviation, QRS duration is 126 milliseconds essentially  unchanged from her previous electrocardiogram.   LABORATORY:  White blood cell count 8.2, H&H 14 and 44, platelet count 284.  Sodium 138, potassium 4.6, chloride 101, bicarb 27, BUN 19, creatinine 0.9,  blood sugar 121.  Point care enzymes x1 are negative.   IMPRESSION:  1.  So this is a lady with chest pain in the setting of severe hypertension.      No EKG changes from previous EKG.  Point of care enzymes are negative      x1.  2.  Hypertension poorly controlled.  3.  Hyperlipidemia.  4.  Known coronary artery disease status post recent bypass surgery.   PLAN:  My plan is to admit her to [facility] under the care the  hospitalist group.  We will consult, cycle her enzymes.  If she has no  significant evidence of ischemia or infarction on electrocardiographic  assessment and enzymatic assessment, then she can have an exercise perfusion  study in the morning.  If her enzymes come back positive, then she will need  a heart catheterization.  She will be transferred to [facility].  I think it  is probably reasonable to add some hydrochlorothiazide to her medical regime  in hopes of getting her blood pressure under better control.      Scarlett Presto, M.D.  Electronically Signed     JH/MEDQ  D:  11/03/2005  T:  11/04/2005  Job:  [id number]

## 2010-10-30 NOTE — Assessment & Plan Note (Signed)
[place] OFFICE NOTE   [name]                      MRN:          [id number]  DATE:03/28/2006                            DOB:          Nov 28, 1926    PRIMARY CARDIOLOGIST:  Dr. Dannielle Burn.   REASON FOR OFFICE VISIT:  Scheduled 1 month followup.  Please refer to my  office note of August 15 for full details.   Since the patient was last seen here in the clinic, the patient was referred  to [place] in consultation here at [facility] on September 14 when she was  seen by Dr. Ron Parker.  She was admitted for chest pain but her symptoms were  felt to be pleuritic and atypical.  She ruled out for a myocardial  infarction and Dr. Ron Parker recommended no further workup.  Since then, we have  learned that [name] has been diagnosed with cholelithiasis and is  scheduled to undergo cholecystectomy this coming Thursday.   Regarding chest pain, the patient continues to report some generalized aches  and pains particularly in her back as well as some right upper quadrant  discomfort which she had when she presented recently to the hospital.  She  otherwise is stable from a cardiovascular standpoint and reports no  exertional chest discomfort.   CURRENT MEDICATIONS:  1. Lisinopril 10 b.i.d.  2. Levoxyl 0.112 daily.  3. Coated aspirin 81 daily.  4. Allegra 180 daily.  5. Zantac 150 b.i.d.  6. Mucinex daily.  7. Metoprolol 100 q.a.m./metoprolol 50 q.p.m.  8. Crestor 20 daily.   PHYSICAL EXAM:  Blood pressure 118/68.  Pulse 80, regular.  Weight 129.  GENERAL:  [name], sitting upright, no apparent distress.  NECK:  Palpable bilateral carotid pulses but no bruits.  LUNGS:  Clear to auscultation in all fields.  HEART:  Regular rate rhythm (S1, S2).  Positive S4.  No significant murmurs.  EXTREMITIES:  No edema.  NEURO:  No focal deficits.   IMPRESSION:  1. Coronary artery disease - stable.     a.     Four-vessel coronary artery bypass graft, July 2006.      b.     Normal left ventricular function.  2. Cholelithiasis.      a.     I will schedule for surgery this week.  3. Valvular heart disease.      a.     Mild/moderate mitral regurgitation by echocardiogram in January       2007.  4. Hyperlipidemia.      a.     Recently started on Crestor.  5. Hypertension - stable.  6. Contrast dye allergy.   PLAN:  Continue current medication regimen.  The patient does not require  any additional ischemic workup prior to undergoing a cholecystectomy this  Thursday, as scheduled.  She was recently evaluated by our team here at  [facility] 1 month ago and presented with atypical chest pain and  negative cardiac markers.  She presents with no new onset exertional chest  discomfort.  Therefore, we will continue current  medication regimen.  She  will need a followup fasting lipid/liver profile in 1 month, given that she  was started on Crestor when she was last seen here in the clinic.  We will  otherwise plan on having her followed with Dr. Dannielle Burn in 1 year as  previously noted, with consideration for a repeat echocardiogram at that  time for continued surveillance of mitral regurgitation.      ______________________________  Mannie Stabile, PA-C    ______________________________  Ernestine Mcmurray, MD,FACC    GS/MedQ  DD:  03/28/2006  DT:  03/29/2006  Job #:  [id number]   cc:   [name], M.D.

## 2010-10-30 NOTE — Consult Note (Signed)
NAMEMINDEE, CREPPEL NO.:  [id number]   MEDICAL RECORD NO.:  [id number]          PATIENT TYPE:  INP   LOCATION:  4711                         FACILITY:  [facility]   PHYSICIAN:  Tory Emerald. Benson Norway, MD    DATE OF BIRTH:  [birth year]   DATE OF CONSULTATION:  DATE OF DISCHARGE:                                   CONSULTATION   DATE OF GASTROENTEROLOGY CONSULTATION:  January 10, 2005.   REFERRING PHYSICIAN:  Lanelle Bal, MD.   REASON FOR CONSULTATION:  Hematochezia.   HISTORY OF PRESENT ILLNESS:  This is a 77 year old white female with a past  medical history of coronary artery disease, gastric cancer status post  resection approximately five years ago, hypertension, hyperlipidemia, and  possibly a new onset diabetes, who was admitted initially to [facility] for complaints of worsening chest pain.  A 12-lead EKG revealed  normal sinus rhythm with an incomplete right bundle branch block.  After  further evaluation, the patient was subsequently transferred over to [facility] for unstable angina, and the patient underwent a cardiac  catheterization with findings of severe coronary disease.  The patient did  not suffer a myocardial infarction during this hospitalization and, during  this brief period of time, she states that is chest-pain-free at this time.  Plan for her is to have the cardiac revascularization on January 11, 2005.  However, on January 09, 2005, during the evening, the patient states that she  was straining on the commode and immediately afterwards noted to have bright  red blood.  She reports a prior history of hematochezia approximately six  years ago, but no chronic recurrence since that time.  She also states  having a colonoscopy by Dr. Melony Overly in [place], with negative findings in  her colon.  She denies any family history of colon cancer.   PAST MEDICAL HISTORY AND PAST SURGICAL HISTORY:  As stated above.   FAMILY HISTORY:  Significant  for coronary artery disease.   ALLERGIES:  IV DYE.   SOCIAL HISTORY:  The patient lives in [place].  She is a widow.  Her husband  died approximately five years ago.  No alcohol or tobacco abuse.   MEDICATIONS:  Aspirin, Docusate, Glipizide, insulin, Levo-Thyroxin,  Lisinopril, Metoprolol, nitroglycerin p.r.n., Pantoprazole, Simvastatin,  Tylenol, and prednisone.   REVIEW OF SYSTEMS:  Significant for chest pain and shortness of breath.  No  dizziness, dysuria, dysarthria, myalgias, myositis, arthralgias, arthritis,  diarrhea, constipation, nausea, vomiting.   PHYSICAL EXAMINATION:  VITAL SIGNS:  Blood pressure is 141/80, heart rate is  64, respirations 23, temperature is 97.1.  GENERAL:  The patient is in no acute distress, pleasant, alert, and  oriented.  HEENT:  Normocephalic, atraumatic, extraocular muscles intact, pupils equal,  round, and reactive to light.  NECK:  Supple.  No lymphadenopathy.  LUNGS:  Clear to auscultation bilaterally.  CARDIOVASCULAR:  Regular rate and rhythm without murmurs, gallops, or rubs.  ABDOMEN:  Flat, soft, nontender, nondistended.  RECTAL EXAMINATION:  Reveals some scant evidence of fresh blood in the anus.  Close examination of the  anus does reveal a source of bleeding from an  external hemorrhoid.  There is no active bleeding at this time.  EXTREMITIES:  No clubbing, cyanosis, or edema.   LABORATORY VALUES:  On January 09, 2005, white blood cell count is 9.9,  hemoglobin 12.9, platelets at 211.  Sodium was 144, potassium 3.8, chloride  108, CO2 30, glucose 152, BUN 15, creatinine 0.9.   IMPRESSION:  1.  Hematochezia secondary to an external hemorrhoid, which was precipitated      by straining.  2.  Coronary artery disease pending cardiac revascularization.   DISCUSSION:  The examination was revealing for an external hemorrhoid that  was the bleeding site.  There are no other masses palpated during the rectal  examination or other abnormalities  noted.  The patient is not anemic.  I do  not feel that further workup is required at this time, and she can most  likely undergo the cardiac revascularization without any difficulty  postoperatively from the GI standpoint.   PLAN:  Maintain her on stool softeners in order to avoid any straining.       PDH/MEDQ  D:  01/10/2005  T:  01/10/2005  Job:  [id number]   cc:   Lanelle Bal, MD  [place]  [place] [address]  Email: [email or web address]

## 2010-10-30 NOTE — Consult Note (Signed)
[name], [name] NO.:  [id number]   MEDICAL RECORD NO.:  [id number]          PATIENT TYPE:  INP   LOCATION:  2036                         FACILITY:  [facility]   PHYSICIAN:  Theresa Kitchen T. [name], M.D.DATE OF BIRTH:  [name], [name]   DATE OF CONSULTATION:  01/15/2005  DATE OF DISCHARGE:                                   CONSULTATION   CHIEF COMPLAINT:  Abdominal distention and pain.   HISTORY OF PRESENT ILLNESS:  I was asked by [name] to evaluate Ms.  [name].  She is a 77 year old female who was admitted to [facility]  on January 05, 2005, with substernal chest pain, shortness of breath.  The  patient was stabilized, transferred to [facility], and cardiac catheterization was  performed showing severe three vessel coronary artery disease.  She  underwent an apparently uneventful coronary artery bypass graft four days  ago on January 11, 2005.  There is no record of any intraoperative  complications or perioperative hypotension.  The patient had been apparently  progressing well.  However, yesterday she apparently began to complain of  some abdominal discomfort and bloating to her family.  She has been somewhat  confused postoperatively.  Today, she has had increased abdominal swelling  and discomfort.  She states she is having pain, waxing and waning, somewhat  more on the right side than the left.  She has not had any nausea or  vomiting.  She and her family state she has been passing flatus and had a  small bowel movement today.  She has not had any bleeding since the onset of  these symptoms.  She did have some hematochezia this hospitalization prior  to her bypass which was found to be coming from an external hemorrhoid on  examination.  She does have a history of fairly recent colonoscopy that was  negative per the chart.  She and her family deny chronic abdominal  complaints.  Specifically, no recurring abdominal pain, bloating, difficulty  with bowel  movements, nausea, or vomiting.   PAST MEDICAL HISTORY:  1.  Surgery significant for a stromal tumor of the stomach resected at      [facility] about five years ago.  2.  Recent diagnosis of diabetes.  3.  Long-standing hypertension.  4.  Hyperlipidemia.  5.  Hypothyroidism.   MEDICATIONS ON ADMISSION:  1.  Ativan.  2.  Ranitidine.  3.  ____________.  4.  Levoxyl.  5.  Lisinopril.   MEDICATIONS THIS HOSPITALIZATION:  1.  Aspirin.  2.  Colace.  3.  Daily Dulcolax suppositories.  4.  Lasix.  5.  Glucotrol.  6.  Sliding scale insulin.  7.  Phenergan p.r.n.  8.  Morphine p.r.n.  9.  Zofran p.r.n.  [name]. Synthroid.  11. Lisinopril.  12. Lopressor.   ALLERGIES:  1.  SULFA.  2.  CODEINE.  3.  CONTRAST DYE.   SOCIAL HISTORY:  She is widowed, lives in [place], no cigarette or alcohol use.   FAMILY HISTORY:  Significant for coronary artery disease in multiple family  members.   REVIEW OF SYSTEMS:  Somewhat difficult due to confusion.  GENERAL:  No fever  or chills.  RESPIRATORY:  No current shortness of breath.  CARDIAC:  No  current chest pain, palpitations.  ABDOMEN/GASTROINTESTINAL:  As above.  MUSCULOSKELETAL:  Some chronic joint pain.   PHYSICAL EXAMINATION:  VITAL SIGNS:  Temperature is 100, heart rate 99,  respirations 20, blood pressure 107/63, O2 saturation 91% on room air.  GENERAL:  A mildly confused, alert, elderly white female, somewhat  uncomfortable.  SKIN:  Warm and dry.  HEENT:  No masses or thyromegaly.  Sclerae nonicteric.  LUNGS:  Clear to auscultation without wheezing or increased work of  breathing.  CARDIAC:  Healing sternotomy, regular rate and rhythm.  Trace ankle edema.  ABDOMEN:  Moderate to marked distention.  Tympanitic.  There is long healed  midline incision.  No incisional or inguinal hernias noted.  Bowel sounds  are present, high pitched and tinkling.  There is moderate abdominal  tenderness diffusely, somewhat greater on the right than  the left.  Some  guarding, but no peritoneal signs.  No discernable masses or organomegaly.  EXTREMITIES:  Mild edema, no joint swelling.  NEUROLOGIC:  She is oriented to person and place, but not date or situation.  Motor and sensory examination is grossly normal.   LABORATORY DATA:  White count is 13,200, down from 17,500 yesterday,  hemoglobin is [name], platelets 224.  Urinalysis negative.  Electrolytes  unremarkable.  Glucose 161.   Flat and upright abdominal x-rays are reviewed which show distention of the  small and large bowel with somewhat asymmetrical distention of the right  colon and cecum with the cecum measuring about [name] cm.  No free air.   ASSESSMENT AND PLAN:  A 77 year old diabetic female four days status [name]  coronary artery bypass graft with two days of progressive abdominal  distention and some abdominal pain and tenderness.  X-rays and clinical  picture  appear most consistent with an adynamic ileus.  I certainly cannot rule out  intestinal ischemia at this point, although this appears less likely.  Agree  with bowel rest and NG suction.  Continue to use Dulcolax suppositories  p.r.n.  We will repeat abdominal x-rays, labs in the morning and follow  closely with you.       BTH/MEDQ  D:  01/15/2005  T:  01/16/2005  Job:  [id number]

## 2010-10-30 NOTE — Consult Note (Signed)
NAMEJALESA, [name] NO.:  [id number]   MEDICAL RECORD NO.:  [id number]          PATIENT TYPE:  INP   LOCATION:  [id number]                         FACILITY:  [facility]   PHYSICIAN:  Lanelle Bal, MD    DATE OF BIRTH:  October 10, 1926   DATE OF CONSULTATION:  01/07/2005  DATE OF DISCHARGE:                                   CONSULTATION   REQUESTING PHYSICIAN:  Loretha Brasil. Lia Foyer, M.D. [facility].   FOLLOWUP CARDIOLOGIST:  Loretha Brasil. Lia Foyer, M.D. [facility]   PRIMARY CARE PHYSICIAN:  Dr. Ferrel Logan in [place].   REASON FOR CONSULTATION:  Coronary occlusive disease.   HISTORY OF PRESENT ILLNESS:  The patient is a 77 year old female who  presented on January 05, 2005 with new onset of substernal chest pain.  She has  known multiple cardiac risk factors including hypertension, dyslipidemia and  new diagnosis of diabetes.  She was admitted to the emergency room on January 04, 2005 in [place] after experiencing an episode of sudden onset substernal  left-sided chest pain radiating to the left shoulder and elbow with some  nausea and shortness of breath.  On admission, EKG showed possible old  inferior myocardial infarction.  The patient was stabilized on nitroglycerin  and heparin and referred to [facility] for further evaluation.  She has  had no previous history of myocardial infarction.  She does have  longstanding hypertension, history of hyperlipidemia, recently started on  Zocor, type 2 diabetes new diagnosis, hemoglobin A1C 7.4, positive family  history, father died at age 67 of myocardial infarction, mother died at 62  of unknown cause.  She has had two brothers who died of myocardial  infarctions, one at age 84 and one at age 19.  She had one sister die of a  myocardial infarction at age 33.  She denies claudication, has no known  renal insufficiency.  Baseline creatinine 0.7.  She has had no previous  stroke.   PAST MEDICAL HISTORY:  Significant for arthritis, history of goiter.  Approximately five years ago she had a partial gastrectomy for a stromal  tumor of the stomach done at [place]:  She lives in [place], retired from a Psychiatric nurse.  She  does not smoke or drink.  Her husband died five years ago from coronary  disease.   MEDICATIONS ON ADMISSION:  The patient is very unclear on her medications,  but it appears she is on Ativan 1 mg daily, ranitidine 150 mg b.i.d.,  estropipate 1.25 mg daily, Levoxyl 112 mcg daily, lisinopril 20 mg daily.  The patient's chart lists fish oil, but she does not remember taking this  nor does she remember taking vitamin E.  On a different list, it appears  that the patient was also taking Lopressor 50 mg twice a day, Zocor 40 mg  daily and Protonix rather than ranitidine.  From the chart and from talking  to the patient's multiple family members, it is obvious that there is not a  clue what this patient actually takes at home.   DRUG ALLERGIES:  SULFA, RASH.  CODEINE CAUSES A RASH.   CARDIAC REVIEW OF SYSTEMS:  Positive for chest pain, rest and exertional  shortness of breath x6 months, history of palpitations.  Denies syncope,  presyncope, orthopnea and lower extremity edema.  GENERAL:  Notes increase  in fatigue.  Denies any constitutional symptoms such as fevers or chills.  RESPIRATORY:  As noted above. GASTROINTESTINAL:  Has had blood in her stool  in the past but this was what led to investigation of her stromal tumor.  NEUROLOGIC:  Denies amaurosis or TIAs.  MUSCULOSKELETAL:  Has diffuse  arthritis.  GU:  Denies any blood in her urine, denies any recent infection.  HEMATOLOGY:  Does note easy bruisability.  ENDOCRINE:  Has a history of  hyperthyroidism.  Is on constant Ativan on a daily basis preoperatively.   PHYSICAL EXAMINATION:  VITAL SIGNS:  Blood pressure is 116/60, heart rate  70, O2 saturation 94% on room air.  GENERAL:  The patient appears elderly.  The patient is awake and alert,   neurologically intact though she is not a very good historian and is  definitely unclear about what medicines that she takes and when.  HEENT:  Pupils equal, round and reactive to light.  Neck:  Without jugular  venous distention.  There is no carotid bruit.  LUNGS:  Clear bilaterally.  HEART:  She has no murmur of mitral regurgitation.  ABDOMEN:  Exam reveals a healed midline scar.  There are no palpable masses  or organomegaly.  Aorta is not palpably enlarged.  EXTREMITIES:  On examination of the lower extremities, she has thin skin in  both lower extremities, appears to have a small vein at both ankles.  She  has +1 DP and PT pulses bilaterally.   LABORATORY DATA:  Laboratory findings include a glucose of 200, creatinine  1.  Troponin is not elevated.   Chest x-ray reviewed.  Patient has had several views, repeat PA and lateral  view, shows a persistent nodule in the right upper lobe.  The patient does  not have any films available to determine if this is new or old.   Cardiac catheterization films are reviewed.  The patient has very diffuse  coronary disease with 70-80% LAD lesion, a small diagonal, diffuse disease  in the distal LAD, intermediate branch that is moderately large, 80%, a  distal circumflex that is diffusely diseased and not bypassable.  Appears to  have ostial right coronary disease, 70-80% distal right prior to the takeoff  of several small posterolateral branches with diffuse disease.  Overall,  ejection fraction appears normal.   IMPRESSION:  An elderly-appearing 77 year old female admitted with new onset  of angina with:  1.  New diagnosis of diabetes mellitus.  2.  New diagnosis of right lung nodule.  3.  New diagnosis of coronary artery disease with three-vessel disease.  4.  History of stromal ulcer with partial gastrectomy five years ago.  5.  Hyperlipidemia.  6.  Hypertension.  7.  Hyperthyroidism.  PLAN:  We will obtain carotid Doppler studies,  echocardiogram and CT scan of  the chest to evaluate the patient prior to making a definitive decision  about proceeding with coronary artery bypass grafting.  I have reviewed with  the patient and her family the risks and options of coronary artery bypass  grafting, including the risk of death, infection, stroke, myocardial  infarction, bleeding, blood transfusion.  The new diagnosis of a lung nodule  is worrisome especially in the setting of a previous  diagnosis of a  malignancy, although this was resected five years previously.     EG/MEDQ  D:  01/07/2005  T:  01/07/2005  Job:  [id number]

## 2010-10-30 NOTE — Cardiovascular Report (Signed)
NAMEFATIME, ANCIRA NO.:  [id number]   MEDICAL RECORD NO.:  [id number]          PATIENT TYPE:  INP   LOCATION:  4711                         FACILITY:  [facility]   PHYSICIAN:  Loretha Brasil. Lia Foyer, M.D. [facility] OF BIRTH:  [birth year]   DATE OF PROCEDURE:  01/07/2005  DATE OF DISCHARGE:                              CARDIAC CATHETERIZATION   INDICATIONS:  Ms. Quiambao has presented with chest pain. The current study is  done to assess coronary anatomy.   PROCEDURES:  1.  Left heart catheterization.  2.  Selective coronary arteriography.  3.  Selective left ventriculography.  4.  Subclavian angiography.   DESCRIPTION OF PROCEDURE:  The patient was brought to the cath lab, prepped  and draped in usual fashion. Through an anterior puncture, the right femoral  artery was easily entered. A 6-French sheath was placed. Views of the left  and right coronary arteries were obtained in multiple angiographic  projections. She tolerated the procedure well and there were no  complications. View of the subclavian was obtained largely because of need  for revascularization surgery. She was taken to the holding area in  satisfactory clinical condition. She was given 1/2 milligram of intravenous  Versed.   HEMODYNAMIC DATA:  1.  Central aortic pressure 156/73, mean 107.  2.  Left ventricular pressure 152/14.  3.  No gradient pullback across aortic valve.   1.  Ventriculography was performed in the RAO projection. Overall systolic      function appeared to be vigorous. There was a small amount of mitral      regurgitation but most of this was on a ectopic beat. This did not      appear to be significant on a sinus beat. No wall motion abnormalities      were identified.  2.  The subclavian and internal mammary appeared to be patent.  3.  The left main is free of critical disease.  4.  The LAD has about an 80% area of focal narrowing proximally. This is      best seen in the RAO  cranial view. There is 70% mid-narrowing. The      distal vessel has a fair amount of luminal irregularity but does appear      to be a graftable vessel and bifurcates at the apex. There was a small      diagonal branch that comes off proximally and is relatively small in      caliber then divides distally. In the midportion of this, there was an      80-90% stenosis.  5.  There are basically to ramus vessels. The superior ramus has an 80% area      of segmental plaquing in the midvessel. The inferior ramus has about 80%      ostial stenosis before bifurcation. The AV circumflex provides a smaller      posterolateral branch and has probably 40% and 80% tandem lesions.  6.  The right coronary artery is large-caliber dominant vessel. There was      about 70% mid-narrowing followed the acute marginal which is  relatively      large. There is then a focal 75% stenosis leading into a posterior      descending and posterolateral branches. The PDA has mild mid      irregularity in the posterolateral and 40% irregularity in the      midportion.   CONCLUSION:  1.  Preserved overall left ventricular function.  2.  Severe three-vessel coronary artery disease.   DISPOSITION:  Surgical consultation will be obtained. We will also do a 2-D  echocardiogram to assess mitral regurgitation which I think is  insignificant.       TDS/MEDQ  D:  01/07/2005  T:  01/07/2005  Job:  [id number]   cc:   Jerene Bears  [place]  [place] [address]  Fax: [phone]   Ernestine Mcmurray, M.D. [facility]  [address]  [place]  [place] [address]   CV Laboratory   Patient's medical record

## 2010-10-30 NOTE — Discharge Summary (Signed)
[name], [name] NO.:  [id number]   MEDICAL RECORD NO.:  [id number]          PATIENT TYPE:  INP   LOCATION:  2036                         FACILITY:  [facility] [facility]   PHYSICIAN:  Lanelle Bal, MD    DATE OF BIRTH:  [birth year]   DATE OF ADMISSION:  01/06/2005  DATE OF DISCHARGE:  01/20/2005                                 DISCHARGE SUMMARY   ADDENDUM:  The patient was tentatively scheduled to be discharged home on  January 17, 2005.  On the afternoon of August 4, the patient had developed  abdominal distention.  KUB was evaluated and showed distended large and  small bowel.  The patient was diagnosed with postoperative ileus, NG tube  was placed and the patient was placed n.p.o.  January 16, 2005, the patient  had slightly improved.  She still had abdominal distention but decrease in  tenderness.  Bowel sounds x 4.  KUB showed right colon still very distended.  The patient was kept n.p.o.  The patient's surgical incisions were dry,  intact, and healing well.  She remained in normal sinus rhythm.  She  remained hemodynamically stable with creatinine stable.  The patient was  started on Ciprofloxacin January 15, 2005, for questionable UTI.  Urinalysis  was negative and cultures were ordered which were never obtained.  The  patient remained on Ciprofloxacin.  She was changed to IV Cipro once  diagnosed with ileus.  January 17, 2005, the patient had temperature of 101.  Abdominal distention had decreased.  KUB showed right colon still dilated  but decreasing.  The patient was improving, overall.  The patient was  continued on current treatment.  She was out of bed and ambulating well.  On  January 18, 2005, the patient's tenderness had decreased significantly.  Chest  x-ray showed almost resolution of the ileus.  Positive bowel movement and  flatus on January 18, 2005.  The patient was started back on diet and advanced  as tolerated.  She remained hemodynamically stable with vital  signs stable.  Temperature had dropped down to 98.8.  Sternal incisions remained dry and  intact.  Noted distal right lower extremity had serous drainage with  erythema and tenderness.  The patient was continued on Cipro.  January 19, 2005, the patient was tolerating regular diet without nausea and vomiting.  She was advanced from clears as tolerated to regular.  Bowel sounds x 4.  Ileus pretty much healed.  Right lower extremity unchanged.  The patient had  a low grade temperature of 99.3.  She was switched from Cipro IV to p.o.  January 20, 2005, the patient was out of bed ambulating well.  Tolerating  regular diet well.  She was normal sinus rhythm.  Sternal incision was dry  and intact.  Right lower extremity still has serous drainage with erythema  and tenderness noted.  Positive edema, right greater than left lower  extremity.  Her CBGs are well controlled.  The patient is without  complaints.  She was discharged to home on postoperative day ten in stable  condition.  She will  be discharged to home on the same medications dictated  prior.  She is to follow the same discharge instructions as dictated prior.  Her follow up appointment is with Dr. Servando Snare on February 04, 2005, at 1:30  p.m.  She is to follow up with Dr. Ferrel Logan in two weeks.  The patient will also  be sent home on appropriate antibiotic for 7-10 days.  This will be  discussed with the surgeon prior to ordering antibiotic.      Darlin Coco, [place]      Lanelle Bal, MD  Electronically Signed    KMD/MEDQ  D:  01/20/2005  T:  01/20/2005  Job:  [id number]

## 2010-10-30 NOTE — Discharge Summary (Signed)
[name], [name] NO.:  [id number]   MEDICAL RECORD NO.:  [id number]          PATIENT TYPE:  INP   LOCATION:  2036                         FACILITY:  [facility]   PHYSICIAN:  Lanelle Bal, MD    DATE OF BIRTH:  December 29, 1926   DATE OF ADMISSION:  01/06/2005  DATE OF DISCHARGE:  01/17/2005                                 DISCHARGE SUMMARY   HISTORY OF PRESENT ILLNESS:  The patient is a 77 year old female, who  presented on January 05, 2005 to [facility] with new onset of substernal  chest pain.  The patient has known multiple cardiac risk factors including  hypertension, dyslipidemia and newly diagnosed diabetes.  She was admitted  to the emergency room on January 04, 2005 in the evening after experiencing an  episode of sudden onset substernal left-sided chest pain radiating to the  left shoulder and elbow with some nausea and shortness of breath.  EKG on  admission showed a possible old inferior myocardial infarction.  The patient  was stabilized with nitroglycerin and heparin and referred to [facility]  for further evaluation.  She has no history of previous myocardial  infarction.  She has longstanding hypertension, history of hyperlipidemia  and was recently started on Zocor.  Additionally, type 2 diabetes mellitus  is a new diagnosis.  Hemoglobin A1C was measured at 7.4.  She was  transferred to [facility] for cardiac catheterization.   PAST MEDICAL HISTORY:  1.  Significant for arthritis.  2.  History of goiter.  3.  Additionally approximately five years ago, she had a partial gastrectomy      for a stromal tumor of the stomach done at [facility].  4.  Other diagnoses as listed above.   SOCIAL HISTORY:  She lives in [place], retired from a Psychiatric nurse.  She  does not smoke or drink.  Her husband died approximately five years ago from  coronary disease.   MEDICATIONS ON ADMISSION:  Uncertain.   ALLERGIES:  SULFA AND CODEINE, IV  DYE.   REVIEW OF SYSTEMS AND PHYSICAL EXAMINATION:  Please see history and physical  done at the time of admission.   HOSPITAL COURSE:  The patient was admitted and on January 07, 2005 taken to the  cath lab, where she was found to the following:  1. Preserved overall left  ventricular function.  2. Severe three vessel coronary artery disease.  For  further details of these findings, please see the catheterization report.  Due to this finding, surgical consultation was obtained with Lanelle Bal, MD, who evaluated the patient and studies and agreed with  recommendations to proceed with surgical revascularization.  It was also  noted in her hospital evaluation that she had a right lung nodule on chest x-  ray.  This was, however, not found on CT scan.  She was deemed medically  ready for surgery and on January 11, 2005 was taken to the operating room,  where she underwent the following procedure:  Coronary artery bypass  grafting x4.  The following grafts were placed:  1. Left internal  mammary  artery to the LAD.  2. Sequential saphenous vein graft to the obtuse  marginal 1 and obtuse marginal 2.  3. Saphenous vein graft to the right  coronary artery.  The patient tolerated the procedure well and was taken to  the surgical intensive care unit in stable condition.   On the postoperative hospital course, the patient has done quite well.  She  has maintained stable hemodynamics.  She has had no significant cardiac  dysrhythmias.  All routine lines, monitors and drainage devices have been  discontinued in the standard fashion.  The patient has had some difficulty  which is slowly resolving of postoperative confusion.  It is felt to be  multifactorial.  She additionally had an initial elevation of her white  blood cell count and some low-grade fevers, but these appear to be returning  back to normal with no obvious source of infection.  A urine culture is  currently pending at the time of this  dictation.  All incisions are healing  well without signs of infection.  She has tolerated a routine advancement in  activity commensurate for low level postoperative convalescence.  She is in  cardiac rehabilitation phase I modalities.  Oxygen has been weaned and she  maintains good saturations.   LABORATORY VALUES:  Stable with the most recent hemoglobin and hematocrit  dated January 14, 2005 at 12.8 and 38 respectively.  Electrolytes, BUN and  creatinine are within normal limits.   Her overall status is felt to be stable for tentative discharge in the  morning of January 17, 2005 pending morning round evaluation and no other  difficulties in the interim.   MEDICATIONS ON DISCHARGE:  1.  Aspirin 325 mg daily.  2.  Lopressor 50 mg twice daily.  3.  Lisinopril 10 mg twice daily.  4.  Zocor 40 mg daily.  5.  Synthroid 0.1 mg daily.  6.  Glucotrol 5 mg daily.  7.  Protonix 40 mg daily.  8.  Lasix 40 mg daily for five days.  9.  K-Dur 20 mEq daily for five days.  10. For pain, Tylox one tablet every six hours as needed.   INSTRUCTIONS:  The patient received written instructions regarding  medications, activity, diet, wound care and followup.   FOLLOWUP:  Followup will include Dr. Lia Foyer in two weeks, Dr. Servando Snare on  February 04, 2005 at 1:30 p.m.   FINAL DIAGNOSIS:  Severe three vessel coronary artery disease with unstable  angina on presentation.   OTHER DIAGNOSES:  1.  Postoperative delirium, resolving.  2.  Postoperative anemia, mild.   ADDENDUM:  The patient had some difficulty preoperatively with hemorrhoidal  bleeding.  This has continued to some degree postoperatively.  She does  appear to be stable in this regard.  She also was noted to have a GI consult  prior to surgery, who felt she did not require further evaluation at this  time and the source of bleeding is the hemorrhoids.  During the hospitalization, she received stool softeners, as  well as fiber bulk agents and  Anusol.  She can continue medications on a  p.r.n. basis regarding her hemorrhoids.  Other diagnoses will be as  previously listed per the dictation.       WEG/MEDQ  D:  01/15/2005  T:  01/16/2005  Job:  [id number]   cc:   Lanelle Bal, MD  [place]  [place] [address]  Email: [email or web address]   Loretha Brasil. Lia Foyer, M.D.  [facility]  [address] [place]  [place] [address]

## 2010-10-30 NOTE — Assessment & Plan Note (Signed)
Theresa OFFICE NOTE   [name], [name]                      MRN:          [id number]  DATE:01/26/2006                            DOB:          [birth year]    PRIMARY CARDIOLOGIST:  Ernestine Mcmurray, MD, [facility]   REASON FOR VISIT:  Scheduled followup visit.   [name] is a very pleasant 77 year old female status post four vessel  CABG July, 2006 for treatment of severe three vessel coronary artery disease  with normal left ventricular function, who now presents in followup.   Since her last visit here in December, 2006 with Dr. Dannielle Burn, the patient  reports no symptoms suggestive of unstable angina pectoris or congestive  heart failure.  She does, however, continue to remain plagued by lack of  energy and easy fatigability.  Notably, she is on increased dose of  metoprolol as compared to her last office visit. She does not have any known  history of myocardial infarction.   The patient reports that she is able to climb a flight of stairs, albeit  slowly, with no associated dyspnea or chest pain.  She denies any orthopnea,  PND or lower extremity edema.   With respect to her fatigue, recent blood work suggests normalization of her  TSH at 0.84 as of May.   CURRENT MEDICATIONS:  1. Lisinopril 10 b.i.d.  2. Simvastatin 40 q.h.s.  3. Levoxyl 0.112 q.d.  4. Coated aspirin 81 q.d.  5. Allegra 180 q.d.  6. Zantac 150 b.i.d.  7. Metoprolol 100 q.a.m./50 q.p.m.   PHYSICAL EXAMINATION:  VITAL SIGNS:  Blood pressure 122/60, pulse 72,  regular, weight 129.  NECK:  Palpable bilateral carotid pulses without bruits. No JVD.  LUNGS:  Clear to auscultation all fields.  HEART:  Regular rate and rhythm (S1 and S2), soft S4.  No significant  murmurs.  ABDOMEN:  Soft and nontender.  EXTREMITIES:  Palpable distal pulses without edema.  NEURO: No focal deficit.   IMPRESSION:  1. Coronary artery disease.  1a.   Status post four-vessel coronary artery bypass graft July, 2006:  LIMA-  LAD; SVG-OM1-OM2; SVG-RCA.  1b.  Normal left ventricular function.  1. Valvular heart disease.  2a.  Mild/moderate mitral regurgitation by echocardiogram January, 2007.  1. Fatigue.  2. Treated hypothyroidism.  3. History of gastric carcinoma.  5a.  Status post partial gastrectomy.  1. Hypertension.  2. Hyperlipidemia.  3. Contrast dye allergy.   PLAN:  1. Decrease Lopressor to 25 b.i.d. to see if this helps ameliorate      patient's complaint of easy fatigability.  Of note, she has normal left      ventricular function and no history of myocardial infarction,      therefore, patient does not have an indication for a beta blocker and      it would perhaps be better to try to manage her hypertension with      alternative antihypertensives.  2. Proceed with aggressive lipid management:  Initiate Crestor 20 q.d.      once patient competes her current  supply of Zocor.  Check a followup      fasting lipid/liver profile in three months.  3. Schedule return clinic followup with me in one month for reassessment      of her blood pressure, pulse and complaint of fatigue.  4. Patient will need a followup echocardiogram in approximately one year      for continued surveillance of her mitral regurgitation.                                   Gene Serpe, PA-C                                Ernestine Mcmurray, MD, [facility]   GS/MedQ  DD:  01/26/2006  DT:  01/26/2006  Job #:  [id number]   cc:   [name]

## 2010-10-30 NOTE — Discharge Summary (Signed)
[name], [name] NO.:  [id number]   MEDICAL RECORD NO.:  [id number]          PATIENT TYPE:  INP   LOCATION:  A209                          FACILITY:  [facility]   PHYSICIAN:  Vanetta Mulders. Dechurch, M.D.DATE OF BIRTH:  07-02-1926   DATE OF ADMISSION:  11/03/2005  DATE OF DISCHARGE:  05/25/2007LH                                 DISCHARGE SUMMARY   DIAGNOSES:  1.  Chest pain.  2.  Coronary artery disease.  3.  Hypertension, uncontrolled.  4.  Upper respiratory infection, resolving.  5.  Left shoulder pain, musculoskeletal, resolving.   DISPOSITION:  The patient is discharged to home.  Follow up with Dr. Dannielle Burn  as scheduled and Dr. Woody Seller.  She will need a BMP on or about June 7.   MEDICATION REGIMEN:  1.  Lisinopril 10 mg b.i.d.  2.  Metoprolol 50 mg b.i.d.  3.  Aspirin 81 mg daily.  4.  Synthroid 112 mcg daily.  5.  Lorazepam 1 mg daily as needed.  6.  Hydrochlorothiazide 25 mg daily.  7.  Darvocet-N 100 q.6-8h. p.r.n. pain.  8.  Sudafed LA over-the-counter daily if needed for cold symptoms.  9.  Benadryl 25 mg q.6-8h. if needed for cold symptoms.  10. Omeprazole.   CONDITION:  Improved.   HOSPITAL COURSE:  The patient is a 77 year old Caucasian female status post  coronary artery bypass grafting in August 2006, followed by [facility]  Cardiology, who presented to the emergency room with substernal chest pain  after she developed left shoulder pain during cardiac rehab.  She was also  noted to be hypertensive with exercise.  She gave further history of nausea  the evening preceding, which she attributed to eating raw onions, though she  had not had these symptoms before.  She received nitroglycerin in the  emergency room.  Her pain subsided and she was admitted to the hospital for  further evaluation.   Her cardiac enzymes remained negative.  There were no changes on her 12-lead  EKG.  She had a chronic right bundle branch block.  During the hospital stay  hydrochlorothiazide was added to her regimen.  Her blood pressures were  better controlled.  She was tolerating medication without difficulty.  She  developed rhinorrhea on the night of admission and felt rather poorly the  next day.  She did complete a Myoview which, fortunately, showed no  ischemia.  She was treated with Sudafed and Benadryl, which markedly  improved her upper respiratory symptoms.  Her heart rate and blood pressure  have remained well-controlled even with these medications on board.  She is  ready for discharge on the following day.  On the day of discharge she is  complaining of some left posterior shoulder pain.  She has some tenderness  which seemed to improve with range of motion.  She does have some crepitus  in the left shoulder, which is unchanged.  Likely this is related to  degenerative change and, anyway, she was reassured and being discharged to  follow up with her regular physician.      Joaquim Lai  Theo Dills, M.D.  Electronically Signed     FED/MEDQ  D:  11/05/2005  T:  11/06/2005  Job:  [id number]   cc:   Jerene Bears  [phone]   Ernestine Mcmurray, M.D. [facility]  [address] [place]  [place] [address]

## 2010-11-26 ENCOUNTER — Encounter: Payer: Self-pay | Admitting: Cardiology

## 2010-12-24 ENCOUNTER — Encounter: Payer: Self-pay | Admitting: Cardiology

## 2011-01-01 ENCOUNTER — Ambulatory Visit (INDEPENDENT_AMBULATORY_CARE_PROVIDER_SITE_OTHER): Payer: Medicare Other | Admitting: Cardiology

## 2011-01-01 ENCOUNTER — Encounter: Payer: Self-pay | Admitting: Cardiology

## 2011-01-01 VITALS — BP 120/78 | HR 71 | Ht 64.0 in | Wt 129.2 lb

## 2011-01-01 DIAGNOSIS — I2510 Atherosclerotic heart disease of native coronary artery without angina pectoris: Secondary | ICD-10-CM

## 2011-01-01 DIAGNOSIS — I25810 Atherosclerosis of coronary artery bypass graft(s) without angina pectoris: Secondary | ICD-10-CM

## 2011-01-01 DIAGNOSIS — I509 Heart failure, unspecified: Secondary | ICD-10-CM

## 2011-01-01 DIAGNOSIS — I5032 Chronic diastolic (congestive) heart failure: Secondary | ICD-10-CM

## 2011-01-01 DIAGNOSIS — I080 Rheumatic disorders of both mitral and aortic valves: Secondary | ICD-10-CM

## 2011-01-01 DIAGNOSIS — I10 Essential (primary) hypertension: Secondary | ICD-10-CM

## 2011-01-01 NOTE — Patient Instructions (Signed)
Continue all current medications. Your physician wants you to follow up in: 6 months.  You will receive a reminder letter in the mail one-two months in advance.  If you don't receive a letter, please call our office to schedule the follow up appointment   

## 2011-01-05 NOTE — Assessment & Plan Note (Signed)
No recurrent chest pain. Continue medical therapy. The patient will remain on Plavix. She reports no complications.

## 2011-01-05 NOTE — Assessment & Plan Note (Signed)
No evidence of significant mitral regurgitation by physical examination.

## 2011-01-05 NOTE — Assessment & Plan Note (Signed)
Blood pressure is well controlled. No change in medical therapy.

## 2011-01-05 NOTE — Assessment & Plan Note (Signed)
No evidence of volume overload or heart failure symptoms.

## 2011-01-05 NOTE — Progress Notes (Signed)
HPI The patient is a 77 year old female with a questionable prior history of atrial fibrillation. However Holter monitor did not show any significant arrhythmias. Her cardiac monitor was performed in February 2012. She also had an echocardiogram done which showed normal ejection fraction only mild mitral regurgitation. The patient has a history of diabetes mellitus, stomach cancer with history of stromal tumor, status post partial gastrectomy 2001. She also has chronic pain syndrome, hyperlipidemia and CVA in 2009 as well as four-vessel bypass grafting in 2006. The patient had problems with uncontrolled hypertension in the past, even presented with hypertensive urgency and mental status changes. Her blood pressure is now well controlled. She is on a good medical regimen. She's taking her Plavix post coronary bypass grafting as the patient is not taking aspirin. She reports occasional left-sided chest pain which is very atypical. She also reportedly has now significant osteopenia hip pain and his been placed calcium and vitamin D supplements. The patient denies any orthopnea palpitations presyncope or syncope.  Allergies  Allergen Reactions  . Codeine     REACTION: Hives    Current Outpatient Prescriptions on File Prior to Visit  Medication Sig Dispense Refill  . clopidogrel (PLAVIX) 75 MG tablet Take 75 mg by mouth daily.        Marland Kitchen levothyroxine (SYNTHROID, LEVOTHROID) 88 MCG tablet Take 88 mcg by mouth daily.        Marland Kitchen lidocaine (XYLOCAINE) 5 % ointment Apply topically as needed.        Marland Kitchen lisinopril (PRINIVIL,ZESTRIL) 10 MG tablet Take 10 mg by mouth 2 (two) times daily.        Marland Kitchen LORazepam (ATIVAN) 1 MG tablet Take 1 mg by mouth 2 (two) times daily as needed.        . metoprolol (LOPRESSOR) 50 MG tablet Take 50 mg by mouth 2 (two) times daily.        . Multiple Vitamins-Minerals (CENTRUM SILVER PO) Take by mouth daily at 8 pm.        . nitroGLYCERIN (NITROSTAT) 0.4 MG SL tablet Place 1 tablet (0.4  mg total) under the tongue every 5 (five) minutes as needed.  25 tablet  3  . oxyCODONE-acetaminophen (PERCOCET) 5-325 MG per tablet Take 1 tablet by mouth 2 (two) times daily.        . ranitidine (ZANTAC) 150 MG capsule Take 150 mg by mouth 2 (two) times daily.        . simvastatin (ZOCOR) 20 MG tablet Take 20 mg by mouth at bedtime.          Past Medical History  Diagnosis Date  . Pure hypercholesterolemia   . Type II or unspecified type diabetes mellitus without mention of complication, not stated as uncontrolled   . Unspecified essential hypertension   . Unspecified transient cerebral ischemia   . Coronary atherosclerosis of native coronary artery     four-vessel CABG, 7/06, ... normal Cardiolite; EF 79%, 8/10  . PUD (peptic ulcer disease)   . Osteoarthritis   . GERD (gastroesophageal reflux disease)   . Hypothyroidism 06/14/2009  . Arthritis   . GIB (gastrointestinal bleeding)     history of  . Stromal tumor of the stomach     history of. partial gastrectomy 2001    Past Surgical History  Procedure Date  . Coronary artery bypass graft     . four-vessel CABG, 7/06, ... normal Cardiolite; EF 79%, 8/10  . Partial thyroid surgery   . Cataract extraction   .  Partial gastrectomy [facility]  . Abdominal hysterectomy   . Gallbladder resection     Family History  Problem Relation Age of Onset  . Heart attack Brother 17  . Coronary artery disease    . Hypertension      History   Social History  . Marital Status: Widowed    Spouse Name: N/A    Number of Children: N/A  . Years of Education: N/A   Occupational History  . RETIRED    Social History Main Topics  . Smoking status: Never Smoker   . Smokeless tobacco: Never Used  . Alcohol Use: No  . Drug Use: No  . Sexually Active: Not on file   Other Topics Concern  . Not on file   Social History Narrative  . No narrative on file   [id number] positives as outlined above. The remainder of the 18   point review of systems is negative  PHYSICAL EXAM BP 120/78  Pulse 71  Ht 5\' 4"  (1.626 m)  Wt 129 lb 4 oz (58.627 kg)  BMI 22.19 kg/m2  General: Well-developed, well-nourished in no distress Head: Normocephalic and atraumatic Eyes:PERRLA/EOMI intact, conjunctiva and lids normal Ears: No deformity or lesions Mouth:normal dentition, normal posterior pharynx Neck: Supple, no JVD.  No masses, thyromegaly or abnormal cervical nodes Lungs: Normal breath sounds bilaterally without wheezing.  Normal percussion Cardiac: regular rate and rhythm with normal S1 and S2, no S3 or S4.  PMI is normal.  No pathological murmurs Abdomen: Normal bowel sounds, abdomen is soft and nontender without masses, organomegaly or hernias noted.  No hepatosplenomegaly MSK: Back normal, normal gait muscle strength and tone normal Vascular: Pulse is normal in all 4 extremities Extremities: No peripheral pitting edema Neurologic: Alert and oriented x 3 Skin: Intact without lesions or rashes Lymphatics: No significant adenopathy Psychologic: Normal affect  ECG: Normal sinus rhythm, right bundle branch block  ASSESSMENT AND PLAN

## 2011-02-27 ENCOUNTER — Emergency Department (HOSPITAL_COMMUNITY): Payer: Medicare Other

## 2011-02-27 ENCOUNTER — Encounter (HOSPITAL_COMMUNITY): Payer: Self-pay | Admitting: Emergency Medicine

## 2011-02-27 ENCOUNTER — Emergency Department (HOSPITAL_COMMUNITY)
Admission: EM | Admit: 2011-02-27 | Discharge: 2011-02-27 | Disposition: A | Discharge status: Home / Self Care | Payer: Medicare Other | Attending: Emergency Medicine | Admitting: Emergency Medicine

## 2011-02-27 DIAGNOSIS — Z951 Presence of aortocoronary bypass graft: Secondary | ICD-10-CM | POA: Insufficient documentation

## 2011-02-27 DIAGNOSIS — S0990XA Unspecified injury of head, initial encounter: Secondary | ICD-10-CM | POA: Insufficient documentation

## 2011-02-27 DIAGNOSIS — K279 Peptic ulcer, site unspecified, unspecified as acute or chronic, without hemorrhage or perforation: Secondary | ICD-10-CM | POA: Insufficient documentation

## 2011-02-27 DIAGNOSIS — I517 Cardiomegaly: Secondary | ICD-10-CM | POA: Insufficient documentation

## 2011-02-27 DIAGNOSIS — Z9849 Cataract extraction status, unspecified eye: Secondary | ICD-10-CM | POA: Insufficient documentation

## 2011-02-27 DIAGNOSIS — R51 Headache: Secondary | ICD-10-CM | POA: Insufficient documentation

## 2011-02-27 DIAGNOSIS — W19XXXA Unspecified fall, initial encounter: Secondary | ICD-10-CM | POA: Insufficient documentation

## 2011-02-27 DIAGNOSIS — I2510 Atherosclerotic heart disease of native coronary artery without angina pectoris: Secondary | ICD-10-CM | POA: Insufficient documentation

## 2011-02-27 DIAGNOSIS — I10 Essential (primary) hypertension: Secondary | ICD-10-CM | POA: Insufficient documentation

## 2011-02-27 DIAGNOSIS — E039 Hypothyroidism, unspecified: Secondary | ICD-10-CM | POA: Insufficient documentation

## 2011-02-27 DIAGNOSIS — E119 Type 2 diabetes mellitus without complications: Secondary | ICD-10-CM | POA: Insufficient documentation

## 2011-02-27 DIAGNOSIS — S0083XA Contusion of other part of head, initial encounter: Secondary | ICD-10-CM

## 2011-02-27 DIAGNOSIS — Z8673 Personal history of transient ischemic attack (TIA), and cerebral infarction without residual deficits: Secondary | ICD-10-CM | POA: Insufficient documentation

## 2011-02-27 DIAGNOSIS — Z9079 Acquired absence of other genital organ(s): Secondary | ICD-10-CM | POA: Insufficient documentation

## 2011-02-27 DIAGNOSIS — S0003XA Contusion of scalp, initial encounter: Secondary | ICD-10-CM | POA: Insufficient documentation

## 2011-02-27 LAB — BASIC METABOLIC PANEL
BUN: 20 mg/dL (ref 6–23)
CO2: 24 mEq/L (ref 19–32)
Calcium: 9.4 mg/dL (ref 8.4–10.5)
Chloride: 101 mEq/L (ref 96–112)
Creatinine, Ser: 0.7 mg/dL (ref 0.50–1.10)
Glucose, Bld: 184 mg/dL — ABNORMAL HIGH (ref 70–99)

## 2011-02-27 LAB — CBC
HCT: 39.9 % (ref 36.0–46.0)
Hemoglobin: 13.3 g/dL (ref 12.0–15.0)
MCH: 29.8 pg (ref 26.0–34.0)
MCV: 89.3 fL (ref 78.0–100.0)
RBC: 4.47 MIL/uL (ref 3.87–5.11)
WBC: 19.7 10*3/uL — ABNORMAL HIGH (ref 4.0–10.5)

## 2011-02-27 LAB — URINALYSIS, ROUTINE W REFLEX MICROSCOPIC
Glucose, UA: 250 mg/dL — AB
Hgb urine dipstick: NEGATIVE
Ketones, ur: NEGATIVE mg/dL
Leukocytes, UA: NEGATIVE
Protein, ur: NEGATIVE mg/dL
pH: 6 (ref 5.0–8.0)

## 2011-02-27 MED ORDER — HYDROCODONE-ACETAMINOPHEN 5-325 MG PO TABS
1.0000 | ORAL_TABLET | Freq: Once | ORAL | Status: AC
  Administered 2011-02-27: 1 via ORAL
  Filled 2011-02-27: qty 1

## 2011-02-27 MED ORDER — ONDANSETRON HCL 4 MG PO TABS
4.0000 mg | ORAL_TABLET | Freq: Once | ORAL | Status: AC
  Administered 2011-02-27: 4 mg via ORAL
  Filled 2011-02-27: qty 1

## 2011-02-27 NOTE — ED Notes (Signed)
Pt c/o ha x a few days with some intermittent neck pain. Pt states she fell on Saturday and was seen by her pcp on Tuesday. Bruising to face noted.

## 2011-02-28 NOTE — ED Provider Notes (Signed)
History     CSN: [id number] Arrival date & time: 02/27/2011  8:46 AM   Chief Complaint  Patient presents with  . Headache     (Include location/radiation/quality/duration/timing/severity/associated sxs/prior treatment) HPI Comments: Family member reports that pt has advanced DJD of multiple sites, and has easy falls . She sustained a fall 9/8 and injured her head and face. She was seen by Dr Woody Seller on 9/11. She was seen again on 9/13. No Exam changes noted, other than bruising of the face. Yesterday pt noted more of a headache than usual. Family members noted pt's blood pressure higher than usual any now request pt be evaluated. Pt denies n/v. No LOC. No reported gross neuro changes.   Patient is a 77 y.o. female presenting with headaches. The history is provided by a relative and the patient.  Headache  This is a new problem. The current episode started more than 2 days ago. The problem has been gradually worsening. The quality of the pain is described as dull. The pain is at a severity of 4/10. The pain is moderate. The pain does not radiate. Pertinent negatives include no anorexia, no fever, no malaise/fatigue, no chest pressure, no near-syncope, no orthopnea, no palpitations, no syncope, no shortness of breath, no nausea and no vomiting. Treatments tried: Her own meds.     Past Medical History  Diagnosis Date  . Pure hypercholesterolemia   . Type II or unspecified type diabetes mellitus without mention of complication, not stated as uncontrolled   . Unspecified essential hypertension   . Unspecified transient cerebral ischemia   . Coronary atherosclerosis of native coronary artery     four-vessel CABG, 7/06, ... normal Cardiolite; EF 79%, 8/10  . PUD (peptic ulcer disease)   . Osteoarthritis   . GERD (gastroesophageal reflux disease)   . Hypothyroidism 06/14/2009  . Arthritis   . GIB (gastrointestinal bleeding)     history of  . Stromal tumor of the stomach     history of.  partial gastrectomy 2001     Past Surgical History  Procedure Date  . Coronary artery bypass graft     . four-vessel CABG, 7/06, ... normal Cardiolite; EF 79%, 8/10  . Partial thyroid surgery   . Cataract extraction   . Partial gastrectomy [facility]  . Abdominal hysterectomy   . Gallbladder resection     Family History  Problem Relation Age of Onset  . Heart attack Brother 80  . Coronary artery disease    . Hypertension      History  Substance Use Topics  . Smoking status: Never Smoker   . Smokeless tobacco: Never Used  . Alcohol Use: No    OB History    Grav Para Term Preterm Abortions TAB SAB Ect Mult Living                  Review of Systems  Constitutional: Negative for fever, malaise/fatigue and activity change.       All ROS Neg except as noted in HPI  HENT: Negative for nosebleeds and neck pain.   Eyes: Negative for photophobia and discharge.  Respiratory: Negative for cough, shortness of breath and wheezing.   Cardiovascular: Negative for chest pain, palpitations, orthopnea, syncope and near-syncope.  Gastrointestinal: Negative for nausea, vomiting, abdominal pain, blood in stool and anorexia.  Genitourinary: Negative for dysuria, frequency and hematuria.  Musculoskeletal: Negative for back pain and arthralgias.  Skin: Negative.   Neurological: Positive for headaches. Negative  for dizziness, seizures and speech difficulty.  Psychiatric/Behavioral: Negative for hallucinations and confusion.    Allergies  Codeine; Contrast media; and Sulfa antibiotics  Home Medications   Current Outpatient Rx  Name Route Sig Dispense Refill  . AZELASTINE HCL 0.15 % NA SOLN Nasal Place 1 spray into the nose daily as needed. For allergies     . CALCIUM CARBONATE 1250 MG PO TABS Oral Take 1 tablet by mouth 2 (two) times daily.      Marland Kitchen CLOPIDOGREL BISULFATE 75 MG PO TABS Oral Take 75 mg by mouth daily.      Marland Kitchen FEXOFENADINE HCL 180 MG PO TABS Oral Take 180 mg  by mouth at bedtime.      . FLUOXETINE HCL 20 MG PO TABS Oral Take 20 mg by mouth daily.      Marland Kitchen LEVOTHYROXINE SODIUM 88 MCG PO TABS Oral Take 88 mcg by mouth daily.     Marland Kitchen LISINOPRIL 10 MG PO TABS Oral Take 10 mg by mouth 2 (two) times daily.      Marland Kitchen LORAZEPAM 1 MG PO TABS Oral Take 1 mg by mouth 2 (two) times daily as needed. For anxiety    . METOPROLOL TARTRATE 50 MG PO TABS Oral Take 50 mg by mouth 2 (two) times daily.      . CENTRUM SILVER PO Oral Take by mouth daily at 8 pm.      . RANITIDINE HCL 150 MG PO CAPS Oral Take 150 mg by mouth 2 (two) times daily.      Marland Kitchen SIMVASTATIN 20 MG PO TABS Oral Take 20 mg by mouth at bedtime.      Marland Kitchen LIDOCAINE 5 % EX OINT Topical Apply topically as needed.      Marland Kitchen NITROGLYCERIN 0.4 MG SL SUBL Sublingual Place 1 tablet (0.4 mg total) under the tongue every 5 (five) minutes as needed. 25 tablet 3    Physical Exam    BP 154/77  Pulse 67  Temp 98.2 F (36.8 C)  Resp 12  Ht 5\' 4"  (1.626 m)  Wt 127 lb (57.607 kg)  BMI 21.80 kg/m2  SpO2 93%  Physical Exam  Nursing note and vitals reviewed. Constitutional: She is oriented to person, place, and time. She appears well-developed and well-nourished.  Non-toxic appearance.  HENT:  Head: Normocephalic.  Right Ear: Tympanic membrane and external ear normal.  Left Ear: Tympanic membrane and external ear normal.  Nose: Nose normal.       Bruising of the forehead, and under both eyes. Forehead sore. No TMJ or jaw deformity or tenderness.  Eyes: EOM and lids are normal. Pupils are equal, round, and reactive to light.  Neck: Normal range of motion. Neck supple. Carotid bruit is not present.       Soreness and discomfort to palpation.  Cardiovascular: Normal rate, regular rhythm, intact distal pulses and normal pulses.   Pulmonary/Chest: Breath sounds normal. No respiratory distress.       No rib tenderness. Symmetrical movement.  Abdominal: Soft. Bowel sounds are normal. There is no tenderness. There is no  guarding.  Musculoskeletal:       Stiffness of upper and lower ext. (not new). No deformity. Good cap refill.  Lymphadenopathy:       Head (right side): No submandibular adenopathy present.       Head (left side): No submandibular adenopathy present.    She has no cervical adenopathy.  Neurological: She is alert and oriented to person, place, and time. She  has normal strength. No cranial nerve deficit or sensory deficit.  Skin: Skin is warm and dry.  Psychiatric: She has a normal mood and affect. Her speech is normal.    ED Course  Procedures  Results for orders placed during the hospital encounter of 02/27/11  CBC      Component Value Range   WBC 19.7 (*) 4.0 - 10.5 (K/uL)   RBC 4.47  3.87 - 5.11 (MIL/uL)   Hemoglobin 13.3  12.0 - 15.0 (g/dL)   HCT 39.9  36.0 - 46.0 (%)   MCV 89.3  78.0 - 100.0 (fL)   MCH 29.8  26.0 - 34.0 (pg)   MCHC 33.3  30.0 - 36.0 (g/dL)   RDW 12.9  11.5 - 15.5 (%)   Platelets 291  150 - 400 (K/uL)  BASIC METABOLIC PANEL      Component Value Range   Sodium 135  135 - 145 (mEq/L)   Potassium 4.0  3.5 - 5.1 (mEq/L)   Chloride 101  96 - 112 (mEq/L)   CO2 24  19 - 32 (mEq/L)   Glucose, Bld 184 (*) 70 - 99 (mg/dL)   BUN 20  6 - 23 (mg/dL)   Creatinine, Ser 0.70  0.50 - 1.10 (mg/dL)   Calcium 9.4  8.4 - 10.5 (mg/dL)   GFR calc non Af Amer >60  >60 (mL/min)   GFR calc Af Amer >60  >60 (mL/min)  URINALYSIS, ROUTINE W REFLEX MICROSCOPIC      Component Value Range   Color, Urine YELLOW  YELLOW    Appearance HAZY (*) CLEAR    Specific Gravity, Urine 1.015  1.005 - 1.030    pH 6.0  5.0 - 8.0    Glucose, UA 250 (*) NEGATIVE (mg/dL)   Hgb urine dipstick NEGATIVE  NEGATIVE    Bilirubin Urine NEGATIVE  NEGATIVE    Ketones, ur NEGATIVE  NEGATIVE (mg/dL)   Protein, ur NEGATIVE  NEGATIVE (mg/dL)   Urobilinogen, UA 0.2  0.0 - 1.0 (mg/dL)   Nitrite NEGATIVE  NEGATIVE    Leukocytes, UA NEGATIVE  NEGATIVE    Dg Chest 2 View  02/27/2011  *RADIOLOGY REPORT*   Clinical Data: Elevated white count.  Fall last week.  CHEST - 2 VIEW  Comparison: 05/28/2008  Findings: The patient has had median sternotomy CABG.  Heart is mildly enlarged.  No pulmonary edema.  There are no focal consolidations or pleural effusions.  Surgical clips are seen in the right upper quadrant of the abdomen.  IMPRESSION:  1.  Cardiomegaly without pulmonary edema. 2. No focal pulmonary abnormality.  Original Report Authenticated By: Glenice Bow, M.D.   Ct Head Wo Contrast  02/27/2011  *RADIOLOGY REPORT*  Clinical Data:  Headache.  Fall.  Weakness.  Dizziness. Hypertension.  CT HEAD WITHOUT CONTRAST CT CERVICAL SPINE WITHOUT CONTRAST  Technique:  Multidetector CT imaging of the head and cervical spine was performed following the standard protocol without intravenous contrast.  Multiplanar CT image reconstructions of the cervical spine were also generated.  Comparison:  07/10/2010  CT HEAD  Findings: There is moderate central cortical atrophy. Periventricular white matter changes are consistent with small vessel disease.  Small, chronic lacunar infarct is identified within the left putamen.  There is atherosclerotic calcification of the internal carotid arteries.  Visualized paranasal sinuses are well-aerated.  No calvarial fracture.  IMPRESSION:  1.  Atrophy and small vessel disease. 2.  Chronic left basal ganglia infarct. 3. No evidence for acute intracranial abnormality.  CT CERVICAL SPINE  Findings: There are moderate degenerative changes in the cervical spine, most notable at C3-4, C4-5, C5-6, and C6-7.  There is mild spinal stenosis at C4-5, C5-6 as a result of the degenerative changes.  There is anterolisthesis of C7 on T1 by 3 mm, felt to be degenerative. There is loss of cervical lordosis.  This may be secondary to splinting, soft tissue injury, or positioning.  The right lobe of the thyroid is enlarged and heterogeneous. Consider thyroid ultrasound for further evaluation.  IMPRESSION:   1.  Degenerative changes of the cervical spine. Anterolisthesis of C7 on T1. 2.  No evidence for acute fracture. 3.  Loss of lordosis.  See above. 4.  Heterogeneous, enlarged right thyroid lobe.  Consider further evaluation with thyroid ultrasound follow-up.  This can be performed as an outpatient.  Original Report Authenticated By: Glenice Bow, M.D.   Ct Cervical Spine Wo Contrast  02/27/2011  *RADIOLOGY REPORT*  Clinical Data:  Headache.  Fall.  Weakness.  Dizziness. Hypertension.  CT HEAD WITHOUT CONTRAST CT CERVICAL SPINE WITHOUT CONTRAST  Technique:  Multidetector CT imaging of the head and cervical spine was performed following the standard protocol without intravenous contrast.  Multiplanar CT image reconstructions of the cervical spine were also generated.  Comparison:  07/10/2010  CT HEAD  Findings: There is moderate central cortical atrophy. Periventricular white matter changes are consistent with small vessel disease.  Small, chronic lacunar infarct is identified within the left putamen.  There is atherosclerotic calcification of the internal carotid arteries.  Visualized paranasal sinuses are well-aerated.  No calvarial fracture.  IMPRESSION:  1.  Atrophy and small vessel disease. 2.  Chronic left basal ganglia infarct. 3. No evidence for acute intracranial abnormality.  CT CERVICAL SPINE  Findings: There are moderate degenerative changes in the cervical spine, most notable at C3-4, C4-5, C5-6, and C6-7.  There is mild spinal stenosis at C4-5, C5-6 as a result of the degenerative changes.  There is anterolisthesis of C7 on T1 by 3 mm, felt to be degenerative. There is loss of cervical lordosis.  This may be secondary to splinting, soft tissue injury, or positioning.  The right lobe of the thyroid is enlarged and heterogeneous. Consider thyroid ultrasound for further evaluation.  IMPRESSION:  1.  Degenerative changes of the cervical spine. Anterolisthesis of C7 on T1. 2.  No evidence for acute  fracture. 3.  Loss of lordosis.  See above. 4.  Heterogeneous, enlarged right thyroid lobe.  Consider further evaluation with thyroid ultrasound follow-up.  This can be performed as an outpatient.  Original Report Authenticated By: Glenice Bow, M.D.     1. Facial contusion   2. Head injury, unspecified      MDM I have reviewed nursing notes, vital signs, and all appropriate lab and imaging results for this patient.       Lenox Ahr, [place] 02/28/11 [phone]

## 2011-03-01 NOTE — ED Provider Notes (Signed)
Medical screening examination/treatment/procedure(s) were performed by non-physician practitioner and as supervising physician I was immediately available for consultation/collaboration.   Maudry Diego, MD 03/01/11 [phone]

## 2011-03-19 LAB — CBC
HCT: 40.4 % (ref 36.0–46.0)
Hemoglobin: 13.1 g/dL (ref 12.0–15.0)
MCV: 87.8 fL (ref 78.0–100.0)
Platelets: 192 10*3/uL (ref 150–400)
RDW: 14.1 % (ref 11.5–15.5)
WBC: 8.4 10*3/uL (ref 4.0–10.5)

## 2011-03-19 LAB — T4, FREE: Free T4: 1.41 ng/dL (ref 0.89–1.80)

## 2011-03-19 LAB — HEMOGLOBIN A1C
Hgb A1c MFr Bld: 6.7 % — ABNORMAL HIGH (ref 4.6–6.1)
Mean Plasma Glucose: 146 mg/dL

## 2011-03-19 LAB — COMPREHENSIVE METABOLIC PANEL
AST: 17 U/L (ref 0–37)
Albumin: 3 g/dL — ABNORMAL LOW (ref 3.5–5.2)
Alkaline Phosphatase: 83 U/L (ref 39–117)
CO2: 28 mEq/L (ref 19–32)
Chloride: 107 mEq/L (ref 96–112)
GFR calc Af Amer: >60 mL/min (ref >60)
GFR calc non Af Amer: >60 mL/min (ref >60)
Potassium: 4.6 mEq/L (ref 3.5–5.1)
Total Bilirubin: 0.4 mg/dL (ref 0.3–1.2)

## 2011-03-19 LAB — DIFFERENTIAL
Basophils Absolute: 0 10*3/uL (ref 0.0–0.1)
Basophils Relative: 0 % (ref 0–1)
Eosinophils Absolute: 0.6 10*3/uL (ref 0.0–0.7)
Eosinophils Relative: 8 % — ABNORMAL HIGH (ref 0–5)
Monocytes Absolute: 0.5 10*3/uL (ref 0.1–1.0)

## 2011-03-19 LAB — GLUCOSE, CAPILLARY
Glucose-Capillary: 101 mg/dL — ABNORMAL HIGH (ref 70–99)
Glucose-Capillary: 106 mg/dL — ABNORMAL HIGH (ref 70–99)
Glucose-Capillary: 117 mg/dL — ABNORMAL HIGH (ref 70–99)
Glucose-Capillary: 121 mg/dL — ABNORMAL HIGH (ref 70–99)
Glucose-Capillary: 133 mg/dL — ABNORMAL HIGH (ref 70–99)
Glucose-Capillary: 140 mg/dL — ABNORMAL HIGH (ref 70–99)
Glucose-Capillary: 156 mg/dL — ABNORMAL HIGH (ref 70–99)
Glucose-Capillary: 164 mg/dL — ABNORMAL HIGH (ref 70–99)
Glucose-Capillary: 63 mg/dL — ABNORMAL LOW (ref 70–99)

## 2011-03-19 LAB — URINALYSIS, ROUTINE W REFLEX MICROSCOPIC
Bilirubin Urine: NEGATIVE
Glucose, UA: NEGATIVE mg/dL
Hgb urine dipstick: NEGATIVE
Nitrite: NEGATIVE
Specific Gravity, Urine: 1.015 (ref 1.005–1.030)
pH: 6 (ref 5.0–8.0)

## 2011-03-19 LAB — PROTEIN S ACTIVITY: Protein S Activity: 115 % (ref 69–129)

## 2011-03-19 LAB — PROTEIN S, TOTAL: Protein S Ag, Total: 113 % (ref 70–140)

## 2011-03-19 LAB — LIPID PANEL
Triglycerides: 135 mg/dL (ref <150)
VLDL: 27 mg/dL (ref 0–40)

## 2011-03-19 LAB — URINE MICROSCOPIC-ADD ON

## 2011-03-19 LAB — PROTEIN C ACTIVITY: Protein C Activity: 137 % — ABNORMAL HIGH (ref 75–133)

## 2011-03-19 LAB — LUPUS ANTICOAGULANT PANEL
PTT Lupus Anticoagulant: 49.6 secs — ABNORMAL HIGH (ref 36.3–48.8)
PTTLA 4:1 Mix: 47.6 secs (ref 36.3–48.8)

## 2011-03-19 LAB — PROTIME-INR: Prothrombin Time: 13.4 seconds (ref 11.6–15.2)

## 2011-03-19 LAB — CK TOTAL AND CKMB (NOT AT ARMC): CK, MB: 0.5 ng/mL (ref 0.3–4.0)

## 2011-03-19 LAB — RPR: RPR Ser Ql: NONREACTIVE

## 2011-03-19 LAB — HOMOCYSTEINE: Homocysteine: 9.5 umol/L (ref 4.0–15.4)

## 2011-03-19 LAB — SEDIMENTATION RATE: Sed Rate: 28 mm/hr — ABNORMAL HIGH (ref 0–22)

## 2011-05-15 ENCOUNTER — Emergency Department (HOSPITAL_COMMUNITY): Payer: Medicare Other

## 2011-05-15 ENCOUNTER — Emergency Department (HOSPITAL_COMMUNITY)
Admission: EM | Admit: 2011-05-15 | Discharge: 2011-05-15 | Disposition: A | Discharge status: Other Acute Inpatient Hospital | Payer: Medicare Other | Attending: Emergency Medicine | Admitting: Emergency Medicine

## 2011-05-15 ENCOUNTER — Encounter (HOSPITAL_COMMUNITY): Payer: Self-pay | Admitting: *Deleted

## 2011-05-15 DIAGNOSIS — I169 Hypertensive crisis, unspecified: Secondary | ICD-10-CM

## 2011-05-15 DIAGNOSIS — J860 Pyothorax with fistula: Secondary | ICD-10-CM | POA: Insufficient documentation

## 2011-05-15 DIAGNOSIS — E7800 Pure hypercholesterolemia, unspecified: Secondary | ICD-10-CM | POA: Insufficient documentation

## 2011-05-15 DIAGNOSIS — R29810 Facial weakness: Secondary | ICD-10-CM | POA: Insufficient documentation

## 2011-05-15 DIAGNOSIS — K279 Peptic ulcer, site unspecified, unspecified as acute or chronic, without hemorrhage or perforation: Secondary | ICD-10-CM | POA: Insufficient documentation

## 2011-05-15 DIAGNOSIS — M1990 Unspecified osteoarthritis, unspecified site: Secondary | ICD-10-CM | POA: Insufficient documentation

## 2011-05-15 DIAGNOSIS — I2510 Atherosclerotic heart disease of native coronary artery without angina pectoris: Secondary | ICD-10-CM | POA: Insufficient documentation

## 2011-05-15 DIAGNOSIS — I10 Essential (primary) hypertension: Secondary | ICD-10-CM | POA: Insufficient documentation

## 2011-05-15 DIAGNOSIS — E039 Hypothyroidism, unspecified: Secondary | ICD-10-CM | POA: Insufficient documentation

## 2011-05-15 DIAGNOSIS — I619 Nontraumatic intracerebral hemorrhage, unspecified: Secondary | ICD-10-CM | POA: Insufficient documentation

## 2011-05-15 DIAGNOSIS — Z951 Presence of aortocoronary bypass graft: Secondary | ICD-10-CM | POA: Insufficient documentation

## 2011-05-15 DIAGNOSIS — Z9079 Acquired absence of other genital organ(s): Secondary | ICD-10-CM | POA: Insufficient documentation

## 2011-05-15 DIAGNOSIS — E119 Type 2 diabetes mellitus without complications: Secondary | ICD-10-CM | POA: Insufficient documentation

## 2011-05-15 DIAGNOSIS — I4510 Unspecified right bundle-branch block: Secondary | ICD-10-CM | POA: Insufficient documentation

## 2011-05-15 LAB — COMPREHENSIVE METABOLIC PANEL
AST: 19 U/L (ref 0–37)
Albumin: 4.1 g/dL (ref 3.5–5.2)
BUN: 15 mg/dL (ref 6–23)
CO2: 29 mEq/L (ref 19–32)
Calcium: 10 mg/dL (ref 8.4–10.5)
Creatinine, Ser: 0.67 mg/dL (ref 0.50–1.10)
GFR calc non Af Amer: 80 mL/min — ABNORMAL LOW (ref >90)

## 2011-05-15 LAB — DIFFERENTIAL
Basophils Absolute: 0 10*3/uL (ref 0.0–0.1)
Basophils Relative: 0 % (ref 0–1)
Eosinophils Relative: 1 % (ref 0–5)
Monocytes Absolute: 1.1 10*3/uL — ABNORMAL HIGH (ref 0.1–1.0)

## 2011-05-15 LAB — CK TOTAL AND CKMB (NOT AT ARMC)
CK, MB: 1.8 ng/mL (ref 0.3–4.0)
Relative Index: INVALID (ref 0.0–2.5)
Total CK: 32 U/L (ref 7–177)

## 2011-05-15 LAB — CBC
HCT: 44.7 % (ref 36.0–46.0)
MCHC: 33.3 g/dL (ref 30.0–36.0)
MCV: 90.1 fL (ref 78.0–100.0)
RDW: 13.7 % (ref 11.5–15.5)

## 2011-05-15 MED ORDER — NITROGLYCERIN IN D5W 200-5 MCG/ML-% IV SOLN
20.0000 ug/min | INTRAVENOUS | Status: DC
  Administered 2011-05-15: 20 ug/min via INTRAVENOUS
  Filled 2011-05-15: qty 250

## 2011-05-15 MED ORDER — ONDANSETRON HCL 4 MG/2ML IJ SOLN
4.0000 mg | Freq: Once | INTRAMUSCULAR | Status: AC
  Administered 2011-05-15: 4 mg via INTRAVENOUS

## 2011-05-15 MED ORDER — MORPHINE SULFATE 4 MG/ML IJ SOLN
INTRAMUSCULAR | Status: AC
  Filled 2011-05-15: qty 1

## 2011-05-15 MED ORDER — SODIUM CHLORIDE 0.9 % IV SOLN
Freq: Once | INTRAVENOUS | Status: AC
  Administered 2011-05-15: 06:00:00 via INTRAVENOUS

## 2011-05-15 MED ORDER — ONDANSETRON HCL 4 MG/2ML IJ SOLN
INTRAMUSCULAR | Status: AC
  Filled 2011-05-15: qty 2

## 2011-05-15 MED ORDER — MORPHINE SULFATE 4 MG/ML IJ SOLN
4.0000 mg | Freq: Once | INTRAMUSCULAR | Status: AC
  Administered 2011-05-15: 4 mg via INTRAVENOUS

## 2011-05-15 NOTE — ED Notes (Signed)
Report given to Merry Proud, RN with [facility]; family notified [facility] will be here within 20 minutes

## 2011-05-15 NOTE — ED Notes (Signed)
Reported by ems pt last seen normal at 1900 yesterday weakness to the right side. Family reported facial draw is new. Pt complaining of headache.

## 2011-05-15 NOTE — ED Notes (Signed)
[facility] here to transport pt to [facility]

## 2011-05-15 NOTE — ED Notes (Signed)
Report  Given to Myrtis Ser charge RN at [facility] ED

## 2011-05-15 NOTE — ED Notes (Signed)
Called [facility]  Line for Neurosurgery for Dr. Sabra Heck.

## 2011-05-15 NOTE — ED Provider Notes (Addendum)
History     CSN: [id number] Arrival date & time: 05/15/2011  6:01 AM   First MD Initiated Contact with Patient 05/15/11 [phone]      Chief Complaint  Patient presents with  . Weakness  . Headache    (Consider location/radiation/quality/duration/timing/severity/associated sxs/prior treatment) HPI Comments: Patient is a 77 year old female with a history of cerebral vascular accident in the summer of 2009, coronary artery disease status post CABG, peripheral vascular disease, stomach cancer status post partial gastrectomy, hypertension and borderline diabetes who presents with headache and focal neurologic deficit. According to EMS there were called out for "headache". On arrival they found the patient to have fallen by the front door of her house, right-sided facial droop and a left sided gaze, she was unable to walk, unable to give a very good history and family members who live next door corroborated that she was last seen normal at 7 PM the night before approximately 10 a half hours prior to EMS arrival, and normally had symmetrical face. He was noted to have severe hypertension at [id number] systolic prior to arrival. Unknown onset of symptoms, constant, nothing makes better or worse, patient unable to answer any further questions, complaining of headache and needing to urinate  Patient is a 77 y.o. female presenting with weakness and headaches. The history is provided by the patient, the EMS personnel and a relative. The history is limited by the condition of the patient (Focal neurologic symptoms, speech abnormality).  Weakness The primary symptoms include headaches.  The headache is associated with weakness.  Additional symptoms include weakness.  Headache     Past Medical History  Diagnosis Date  . Pure hypercholesterolemia   . Type II or unspecified type diabetes mellitus without mention of complication, not stated as uncontrolled   . Unspecified essential hypertension   . Unspecified  transient cerebral ischemia   . Coronary atherosclerosis of native coronary artery     four-vessel CABG, 7/06, ... normal Cardiolite; EF 79%, 8/10  . PUD (peptic ulcer disease)   . Osteoarthritis   . GERD (gastroesophageal reflux disease)   . Hypothyroidism 06/14/2009  . Arthritis   . GIB (gastrointestinal bleeding)     history of  . Stromal tumor of the stomach     history of. partial gastrectomy 2001    Past Surgical History  Procedure Date  . Coronary artery bypass graft     . four-vessel CABG, 7/06, ... normal Cardiolite; EF 79%, 8/10  . Partial thyroid surgery   . Cataract extraction   . Partial gastrectomy [facility]  . Abdominal hysterectomy   . Gallbladder resection     Family History  Problem Relation Age of Onset  . Heart attack Brother 58  . Coronary artery disease    . Hypertension      History  Substance Use Topics  . Smoking status: Never Smoker   . Smokeless tobacco: Never Used  . Alcohol Use: No    OB History    Grav Para Term Preterm Abortions TAB SAB Ect Mult Living                  Review of Systems  Unable to perform ROS: Other  Neurological: Positive for weakness and headaches.    Allergies  Codeine; Contrast media; and Sulfa antibiotics  Home Medications   Current Outpatient Rx  Name Route Sig Dispense Refill  . AZELASTINE HCL 0.15 % NA SOLN Nasal Place 1 spray into the nose daily  as needed. For allergies     . CALCIUM CARBONATE 1250 MG PO TABS Oral Take 1 tablet by mouth 2 (two) times daily.      Marland Kitchen CLOPIDOGREL BISULFATE 75 MG PO TABS Oral Take 75 mg by mouth daily.      Marland Kitchen FEXOFENADINE HCL 180 MG PO TABS Oral Take 180 mg by mouth at bedtime.      . FLUOXETINE HCL 20 MG PO TABS Oral Take 20 mg by mouth daily.      Marland Kitchen LEVOTHYROXINE SODIUM 88 MCG PO TABS Oral Take 88 mcg by mouth daily.     Marland Kitchen LIDOCAINE 5 % EX OINT Topical Apply topically as needed.      Marland Kitchen LISINOPRIL 10 MG PO TABS Oral Take 10 mg by mouth 2 (two) times  daily.      Marland Kitchen LORAZEPAM 1 MG PO TABS Oral Take 1 mg by mouth 2 (two) times daily as needed. For anxiety    . METOPROLOL TARTRATE 50 MG PO TABS Oral Take 50 mg by mouth 2 (two) times daily.      . CENTRUM SILVER PO Oral Take by mouth daily at 8 pm.      . NITROGLYCERIN 0.4 MG SL SUBL Sublingual Place 1 tablet (0.4 mg total) under the tongue every 5 (five) minutes as needed. 25 tablet 3  . RANITIDINE HCL 150 MG PO CAPS Oral Take 150 mg by mouth 2 (two) times daily.      Marland Kitchen SIMVASTATIN 20 MG PO TABS Oral Take 20 mg by mouth at bedtime.        BP 226/143  Pulse 72  Temp(Src) 97.6 F (36.4 C) (Oral)  Resp 20  Ht 5\' 5"  (1.651 m)  Wt 127 lb (57.607 kg)  BMI 21.13 kg/m2  SpO2 97%  Physical Exam  Nursing note and vitals reviewed. Constitutional: She appears well-nourished.       Uncomfortable appearing  HENT:  Head: Normocephalic and atraumatic.  Mouth/Throat: Oropharynx is clear and moist. No oropharyngeal exudate.  Eyes: Conjunctivae are normal. Right eye exhibits no discharge. Left eye exhibits no discharge. No scleral icterus.       Left-sided gaze, cannot move eyes past midline to the right, cannot see right visual field, left pupil abnormal shape  Neck: Normal range of motion. Neck supple. No JVD present. No thyromegaly present.       No carotid bruit heard  Cardiovascular: Normal rate, regular rhythm, normal heart sounds and intact distal pulses.  Exam reveals no gallop and no friction rub.   No murmur heard. Pulmonary/Chest: Effort normal and breath sounds normal. No respiratory distress. She has no wheezes. She has no rales.  Abdominal: Soft. Bowel sounds are normal. She exhibits no distension and no mass. There is no tenderness.  Musculoskeletal: Normal range of motion. She exhibits no edema and no tenderness.  Lymphadenopathy:    She has no cervical adenopathy.  Neurological: She is alert.       Patient unable to state where she is or what day it is. As a left-sided gaze,  cannot move past midline to the right with eyes, all 4 extremities with 5 out of 5 strength, right-sided facial droop, patient mumbles words, occasionally speaks in clear sentences, follows commands.  Has pronator drift on the left, limb ataxia in all 4 extremities  Skin: Skin is warm and dry. No rash noted. No erythema.    ED Course  Procedures (including critical care time)  Labs Reviewed  CBC -  Abnormal; Notable for the following:    WBC 16.9 (*)    All other components within normal limits  DIFFERENTIAL - Abnormal; Notable for the following:    Neutro Abs 11.7 (*)    Monocytes Absolute 1.1 (*)    All other components within normal limits  PROTIME-INR  APTT  COMPREHENSIVE METABOLIC PANEL  CK TOTAL AND CKMB  TROPONIN I   Ct Head Wo Contrast  05/15/2011  *RADIOLOGY REPORT*  Clinical Data: Right-sided hemineglect and visual loss; right-sided facial droop.  CT HEAD WITHOUT CONTRAST  Technique:  Contiguous axial images were obtained from the base of the skull through the vertex without contrast.  Comparison: CT of the head performed 02/27/2011, and MRI of the brain performed 07/11/2010  Findings:   There is an acute intraparenchymal hemorrhage identified within the right cerebellar hemisphere, measuring approximately 4.6 x 2.9 cm, with surrounding vasogenic edema. Overlying subdural and subarachnoid blood is noted along the tentorium cerebelli, and filling the cisterns about the brainstem and cerebral peduncles.  There is mass effect on the right side of the brainstem; the bleed involves the right superior cerebellar peduncle.  Blood is also noted within the displaced fourth ventricle; the fourth ventricle is shifted to the left of midline.  This will cause obstructive hydrocephalus over time, though no significant hydrocephalus is yet evident.  The location of the bleed raises significant concern for transtentorial herniation.  Prominence of the ventricles and sulci reflects mild to moderate  cortical volume loss.  Scattered periventricular and subcortical white matter change likely reflects small vessel ischemic microangiopathy.  A chronic lacunar infarct is noted within the left corona radiata.  There is no evidence of fracture; visualized osseous structures are unremarkable in appearance.  The visualized portions of the orbits are within normal limits.  The paranasal sinuses and mastoid air cells are well-aerated.  No significant soft tissue abnormalities are seen.  IMPRESSION:  1.  Large acute intraparenchymal hemorrhage within the right cerebellar hemisphere, measuring 4.6 x 2.9 cm, with surrounding vasogenic edema.  Overlying subdural and subarachnoid blood tracking along the tentorium cerebelli, and filling the cisterns about the brainstem and cerebral peduncle. 2.  Mass effect on the right side of the brainstem; the bleed involves the right superior cerebellar peduncle.  Blood also noted within the displaced and effaced fourth ventricle; this will cause obstructive hydrocephalus over time, though no hydrocephalus is yet evident. 3.  The location of the bleed raises significant concern for transtentorial herniation. 4.  Mild to moderate cortical volume loss, with scattered small vessel ischemic microangiopathy; chronic lacunar infarct within the left corona radiata.  Critical Value/emergent results were called by telephone at the time of interpretation on 05/15/2011  at 06:02 a.m.  to  Dr. Noemi Chapel, who verbally acknowledged these results.  Original Report Authenticated By: Santa Lighter, M.D.     1. Intracerebral hemorrhage   2. Hypertensive crisis       MDM  Possible acute stroke, severe hypertension, history of stroke in the past, CT scan emergently, lab work, EKG, blood pressure control, anticipate admission.  Headache with neurologic deficits raises concern for intracerebral hemorrhage  6:16 AM  results discussed with radiologist who concurs the patient has large  intracerebral hemorrhage on the right, this is consistent with patient's exam, on Plavix, no other reversible anticoagulant, severe hypertension treated with nitroglycerin, consider nimodipine, discussed with patient regarding airway procedure as needed and she agrees. At this time she is maintaining her airway, following commands other than  her extraocular movements. Neurosurgery paged immediately on radiology results communication.   ED ECG REPORT   Date: 05/15/2011   Rate: 75  Rhythm: normal sinus rhythm  QRS Axis: left  Intervals: normal  ST/T Wave abnormalities: nonspecific T wave changes  Conduction Disutrbances:right bundle branch block  Narrative Interpretation:   Old EKG Reviewed: unchanged compared with 07/10/1998 for  0700 - discussed care with Dr. Sherwood Gambler of neurosurgery, at this time no neurosurgical beds available at the hospital, we'll pursue transport to [place] - care discussed with Dr. Meda Coffee in the emergency department and neurosurgeon Dr.Tutten (sp?)  Accepted in transport to the emergency department at [facility]. Ambulance transport arranged through [facility] who is enroute.  BP imrppoving on nitro gtt.  CRITICAL CARE Performed by: Johnna Acosta   Total critical care time: 45  Critical care time was exclusive of separately billable procedures and treating other patients.  Critical care was necessary to treat or prevent imminent or life-threatening deterioration.  Critical care was time spent personally by me on the following activities: development of treatment plan with patient and/or surrogate as well as nursing, discussions with consultants, evaluation of patient's response to treatment, examination of patient, obtaining history from patient or surrogate, ordering and performing treatments and interventions, ordering and review of laboratory studies, ordering and review of radiographic studies, pulse oximetry and re-evaluation of patient's  condition.   Johnna Acosta, MD 05/15/11 [id number]  Johnna Acosta, MD 05/15/11 [phone]

## 2011-05-15 NOTE — ED Notes (Signed)
Returned to er w/ pt after ct.

## 2011-05-15 NOTE — ED Notes (Signed)
Went w/ pt to ct.

## 2011-06-17 DIAGNOSIS — E7800 Pure hypercholesterolemia, unspecified: Secondary | ICD-10-CM | POA: Diagnosis not present

## 2011-06-17 DIAGNOSIS — I619 Nontraumatic intracerebral hemorrhage, unspecified: Secondary | ICD-10-CM | POA: Diagnosis not present

## 2011-06-17 DIAGNOSIS — K279 Peptic ulcer, site unspecified, unspecified as acute or chronic, without hemorrhage or perforation: Secondary | ICD-10-CM | POA: Diagnosis not present

## 2011-06-17 DIAGNOSIS — E039 Hypothyroidism, unspecified: Secondary | ICD-10-CM | POA: Diagnosis not present

## 2011-06-17 DIAGNOSIS — E785 Hyperlipidemia, unspecified: Secondary | ICD-10-CM | POA: Diagnosis not present

## 2011-06-17 DIAGNOSIS — N390 Urinary tract infection, site not specified: Secondary | ICD-10-CM | POA: Diagnosis not present

## 2011-06-17 DIAGNOSIS — I10 Essential (primary) hypertension: Secondary | ICD-10-CM | POA: Diagnosis not present

## 2011-06-17 DIAGNOSIS — K219 Gastro-esophageal reflux disease without esophagitis: Secondary | ICD-10-CM | POA: Diagnosis not present

## 2011-06-17 DIAGNOSIS — D649 Anemia, unspecified: Secondary | ICD-10-CM | POA: Diagnosis not present

## 2011-06-17 DIAGNOSIS — M6281 Muscle weakness (generalized): Secondary | ICD-10-CM | POA: Diagnosis not present

## 2011-06-17 DIAGNOSIS — R269 Unspecified abnormalities of gait and mobility: Secondary | ICD-10-CM | POA: Diagnosis not present

## 2011-06-17 DIAGNOSIS — R4182 Altered mental status, unspecified: Secondary | ICD-10-CM | POA: Diagnosis not present

## 2011-06-17 DIAGNOSIS — I69991 Dysphagia following unspecified cerebrovascular disease: Secondary | ICD-10-CM | POA: Diagnosis not present

## 2011-06-17 DIAGNOSIS — H4720 Unspecified optic atrophy: Secondary | ICD-10-CM | POA: Diagnosis not present

## 2011-06-17 DIAGNOSIS — E119 Type 2 diabetes mellitus without complications: Secondary | ICD-10-CM | POA: Diagnosis not present

## 2011-06-17 DIAGNOSIS — E782 Mixed hyperlipidemia: Secondary | ICD-10-CM | POA: Diagnosis not present

## 2011-06-17 DIAGNOSIS — I69928 Other speech and language deficits following unspecified cerebrovascular disease: Secondary | ICD-10-CM | POA: Diagnosis not present

## 2011-06-17 DIAGNOSIS — Z79899 Other long term (current) drug therapy: Secondary | ICD-10-CM | POA: Diagnosis not present

## 2011-06-17 DIAGNOSIS — I6789 Other cerebrovascular disease: Secondary | ICD-10-CM | POA: Diagnosis not present

## 2011-06-17 DIAGNOSIS — I259 Chronic ischemic heart disease, unspecified: Secondary | ICD-10-CM | POA: Diagnosis not present

## 2011-06-17 DIAGNOSIS — M2550 Pain in unspecified joint: Secondary | ICD-10-CM | POA: Diagnosis not present

## 2011-07-29 ENCOUNTER — Emergency Department (HOSPITAL_COMMUNITY)
Admission: EM | Admit: 2011-07-29 | Discharge: 2011-07-29 | Disposition: A | Discharge status: Skilled Nursing Facility | Payer: Medicare Other | Attending: Emergency Medicine | Admitting: Emergency Medicine

## 2011-07-29 ENCOUNTER — Emergency Department (HOSPITAL_COMMUNITY): Payer: Medicare Other

## 2011-07-29 ENCOUNTER — Encounter (HOSPITAL_COMMUNITY): Payer: Self-pay | Admitting: Emergency Medicine

## 2011-07-29 DIAGNOSIS — G319 Degenerative disease of nervous system, unspecified: Secondary | ICD-10-CM | POA: Insufficient documentation

## 2011-07-29 DIAGNOSIS — I10 Essential (primary) hypertension: Secondary | ICD-10-CM | POA: Insufficient documentation

## 2011-07-29 DIAGNOSIS — R112 Nausea with vomiting, unspecified: Secondary | ICD-10-CM | POA: Insufficient documentation

## 2011-07-29 DIAGNOSIS — Z9849 Cataract extraction status, unspecified eye: Secondary | ICD-10-CM | POA: Insufficient documentation

## 2011-07-29 DIAGNOSIS — N390 Urinary tract infection, site not specified: Secondary | ICD-10-CM | POA: Diagnosis not present

## 2011-07-29 DIAGNOSIS — I2510 Atherosclerotic heart disease of native coronary artery without angina pectoris: Secondary | ICD-10-CM | POA: Insufficient documentation

## 2011-07-29 DIAGNOSIS — Z951 Presence of aortocoronary bypass graft: Secondary | ICD-10-CM | POA: Insufficient documentation

## 2011-07-29 DIAGNOSIS — K279 Peptic ulcer, site unspecified, unspecified as acute or chronic, without hemorrhage or perforation: Secondary | ICD-10-CM | POA: Insufficient documentation

## 2011-07-29 DIAGNOSIS — E039 Hypothyroidism, unspecified: Secondary | ICD-10-CM | POA: Insufficient documentation

## 2011-07-29 DIAGNOSIS — R42 Dizziness and giddiness: Secondary | ICD-10-CM

## 2011-07-29 DIAGNOSIS — R4182 Altered mental status, unspecified: Secondary | ICD-10-CM | POA: Diagnosis not present

## 2011-07-29 DIAGNOSIS — E7800 Pure hypercholesterolemia, unspecified: Secondary | ICD-10-CM | POA: Insufficient documentation

## 2011-07-29 DIAGNOSIS — Z9079 Acquired absence of other genital organ(s): Secondary | ICD-10-CM | POA: Insufficient documentation

## 2011-07-29 DIAGNOSIS — Z8673 Personal history of transient ischemic attack (TIA), and cerebral infarction without residual deficits: Secondary | ICD-10-CM | POA: Insufficient documentation

## 2011-07-29 DIAGNOSIS — F0390 Unspecified dementia without behavioral disturbance: Secondary | ICD-10-CM | POA: Insufficient documentation

## 2011-07-29 DIAGNOSIS — F29 Unspecified psychosis not due to a substance or known physiological condition: Secondary | ICD-10-CM | POA: Insufficient documentation

## 2011-07-29 DIAGNOSIS — M129 Arthropathy, unspecified: Secondary | ICD-10-CM | POA: Insufficient documentation

## 2011-07-29 DIAGNOSIS — E119 Type 2 diabetes mellitus without complications: Secondary | ICD-10-CM | POA: Insufficient documentation

## 2011-07-29 DIAGNOSIS — M2550 Pain in unspecified joint: Secondary | ICD-10-CM | POA: Diagnosis not present

## 2011-07-29 LAB — CBC
MCH: 29.5 pg (ref 26.0–34.0)
Platelets: 305 10*3/uL (ref 150–400)
RBC: 4.3 MIL/uL (ref 3.87–5.11)
RDW: 13.7 % (ref 11.5–15.5)

## 2011-07-29 LAB — URINE MICROSCOPIC-ADD ON

## 2011-07-29 LAB — DIFFERENTIAL
Basophils Absolute: 0 10*3/uL (ref 0.0–0.1)
Basophils Relative: 0 % (ref 0–1)
Eosinophils Absolute: 0.4 10*3/uL (ref 0.0–0.7)
Eosinophils Relative: 4 % (ref 0–5)
Lymphs Abs: 3.4 10*3/uL (ref 0.7–4.0)
Neutrophils Relative %: 56 % (ref 43–77)

## 2011-07-29 LAB — COMPREHENSIVE METABOLIC PANEL
ALT: 12 U/L (ref 0–35)
AST: 18 U/L (ref 0–37)
Albumin: 3.3 g/dL — ABNORMAL LOW (ref 3.5–5.2)
Alkaline Phosphatase: 59 U/L (ref 39–117)
Calcium: 10.5 mg/dL (ref 8.4–10.5)
Glucose, Bld: 121 mg/dL — ABNORMAL HIGH (ref 70–99)
Potassium: 3.6 mEq/L (ref 3.5–5.1)
Sodium: 138 mEq/L (ref 135–145)
Total Protein: 6.4 g/dL (ref 6.0–8.3)

## 2011-07-29 LAB — URINALYSIS, ROUTINE W REFLEX MICROSCOPIC
Bilirubin Urine: NEGATIVE
Glucose, UA: NEGATIVE mg/dL
Ketones, ur: NEGATIVE mg/dL
Nitrite: NEGATIVE
Specific Gravity, Urine: <1.005 — ABNORMAL LOW (ref 1.005–1.030)
pH: 6 (ref 5.0–8.0)

## 2011-07-29 MED ORDER — ONDANSETRON HCL 4 MG/2ML IJ SOLN
4.0000 mg | Freq: Once | INTRAMUSCULAR | Status: AC
  Administered 2011-07-29: 4 mg via INTRAVENOUS
  Filled 2011-07-29: qty 2

## 2011-07-29 MED ORDER — SODIUM CHLORIDE 0.9 % IV BOLUS (SEPSIS)
500.0000 mL | Freq: Once | INTRAVENOUS | Status: AC
  Administered 2011-07-29: 500 mL via INTRAVENOUS

## 2011-07-29 MED ORDER — MECLIZINE HCL 12.5 MG PO TABS
25.0000 mg | ORAL_TABLET | Freq: Once | ORAL | Status: AC
  Administered 2011-07-29: 25 mg via ORAL
  Filled 2011-07-29: qty 2

## 2011-07-29 MED ORDER — ONDANSETRON HCL 4 MG PO TABS: 4.0000 mg | ORAL_TABLET | Freq: Four times a day (QID) | ORAL | Status: AC

## 2011-07-29 MED ORDER — MECLIZINE HCL 25 MG PO TABS: ORAL_TABLET | ORAL | Status: DC

## 2011-07-29 NOTE — ED Notes (Signed)
Per nursing home pt has had altered mental status x 2 days. Pt states she does not know why she is here.

## 2011-07-29 NOTE — ED Provider Notes (Signed)
History   Scribed for [employer]. Olin Hauser, MD, the patient was seen in APA06/[facility]06. The chart was scribed by Clarisa Fling. The patients care was started at 5:57 PM.  CSN: [id number]  Arrival date & time 07/29/11  1731   First MD Initiated Contact with Patient 07/29/11 1741      Chief Complaint  Patient presents with  . Altered Mental Status   Level 5 Caveat. Due to dementia and previous stroke. History is provided by the granddaughter. (Consider location/radiation/quality/duration/timing/severity/associated sxs/prior treatment) HPI [name] is a 78 y.o. female who presents to the Emergency Department complaining of altered mental status. Per nursing home, pt has had altered mental status x2 days. Pt states she does not know why she is here. Per family, pt had stroke ~2 months prior and remained in [facility] ICU for two weeks. Pt was then transferred to nursing facility where she seemed fine up until Monday. Family member notes that pt woke up Tuesday feeling nausea, witch associated dizziness, headache, and vomiting. Also states that pt is dizzy and confused today. States that pt had UTI check last night. Pt family caused Neuro Wednesday and was told to present to ED if symptoms worsened. Pt also has had low BP levels (92/70), low grade fever, and HR levels elevated today. Pt is typically ambulatory with difficulty. Pt states that "it feels like I have no blood". Pt has residual weakness from cerebellar stroke. Family notes pt had CT scan and was placed back on Plavix. Pt lives at [facility] in [place] and is under the care of Dr. Darrick Meigs?.   PCP: Dr. Woody Seller Neuro: Dr Cecilie Lowers    Past Medical History  Diagnosis Date  . Pure hypercholesterolemia   . Type II or unspecified type diabetes mellitus without mention of complication, not stated as uncontrolled   . Unspecified essential hypertension   . Unspecified transient cerebral ischemia   . Coronary atherosclerosis of native coronary artery       four-vessel CABG, 7/06, ... normal Cardiolite; EF 79%, 8/10  . PUD (peptic ulcer disease)   . Osteoarthritis   . GERD (gastroesophageal reflux disease)   . Hypothyroidism 06/14/2009  . Arthritis   . GIB (gastrointestinal bleeding)     history of  . Stromal tumor of the stomach     history of. partial gastrectomy 2001    Past Surgical History  Procedure Date  . Coronary artery bypass graft     . four-vessel CABG, 7/06, ... normal Cardiolite; EF 79%, 8/10  . Partial thyroid surgery   . Cataract extraction   . Partial gastrectomy [facility]  . Abdominal hysterectomy   . Gallbladder resection     Family History  Problem Relation Age of Onset  . Heart attack Brother 20  . Coronary artery disease    . Hypertension      History  Substance Use Topics  . Smoking status: Never Smoker   . Smokeless tobacco: Never Used  . Alcohol Use: No    OB History    Grav Para Term Preterm Abortions TAB SAB Ect Mult Living                 Level 5 Caveat. Due to dementia and previous stroke. History is provided by the granddaughter. Review of Systems  Allergies  Codeine; Contrast media; and Sulfa antibiotics  Home Medications   Current Outpatient Rx  Name Route Sig Dispense Refill  . AZELASTINE HCL 0.15 % NA SOLN  Nasal Place 1 spray into the nose daily as needed. For allergies     . CALCIUM CARBONATE 1250 MG PO TABS Oral Take 1 tablet by mouth 2 (two) times daily.      Marland Kitchen CLOPIDOGREL BISULFATE 75 MG PO TABS Oral Take 75 mg by mouth daily.      Marland Kitchen FEXOFENADINE HCL 180 MG PO TABS Oral Take 180 mg by mouth at bedtime.      . FLUOXETINE HCL 20 MG PO TABS Oral Take 20 mg by mouth daily.      Marland Kitchen LEVOTHYROXINE SODIUM 88 MCG PO TABS Oral Take 88 mcg by mouth daily.     Marland Kitchen LIDOCAINE 5 % EX OINT Topical Apply topically as needed.      Marland Kitchen LISINOPRIL 10 MG PO TABS Oral Take 10 mg by mouth 2 (two) times daily.      Marland Kitchen LORAZEPAM 1 MG PO TABS Oral Take 1 mg by mouth 2 (two) times  daily as needed. For anxiety    . METOPROLOL TARTRATE 50 MG PO TABS Oral Take 75 mg by mouth 2 (two) times daily.     . CENTRUM SILVER PO Oral Take by mouth daily at 8 pm.      . NITROGLYCERIN 0.4 MG SL SUBL Sublingual Place 1 tablet (0.4 mg total) under the tongue every 5 (five) minutes as needed. 25 tablet 3  . RALOXIFENE HCL 60 MG PO TABS Oral Take 60 mg by mouth daily.      Marland Kitchen RANITIDINE HCL 150 MG PO CAPS Oral Take 150 mg by mouth 2 (two) times daily.      Marland Kitchen SIMVASTATIN 20 MG PO TABS Oral Take 20 mg by mouth at bedtime.        BP 116/74  Temp(Src) 98.7 F (37.1 C) (Oral)  Resp 18  SpO2 94%  Physical Exam  Constitutional: She is oriented to person, place, and time. She appears well-developed and well-nourished.  Non-toxic appearance. She does not have a sickly appearance.  HENT:  Head: Normocephalic and atraumatic.  Eyes: Conjunctivae, EOM and lids are normal. Pupils are equal, round, and reactive to light. No scleral icterus.  Neck: Trachea normal and normal range of motion. Neck supple.  Cardiovascular: Regular rhythm and normal heart sounds.   Pulmonary/Chest: Effort normal and breath sounds normal.  Abdominal: Soft. Normal appearance. There is no tenderness. There is no rebound, no guarding and no CVA tenderness.  Musculoskeletal: Normal range of motion.  Neurological: She is alert and oriented to person, place, and time. She has normal strength.  Skin: Skin is warm, dry and intact. No rash noted.    ED Course  Procedures (including critical care time)   Labs Reviewed  CBC  DIFFERENTIAL  COMPREHENSIVE METABOLIC PANEL  URINALYSIS, ROUTINE W REFLEX MICROSCOPIC   No results found.   No diagnosis found.  DIAGNOSTIC STUDIES: Oxygen Saturation is 94% on room air, low by my interpretation.    Radiology: CT Head Wo Contrast. Reviewed by me. IMPRESSION: 1. Resolution of right cerebellar hematoma with residual encephalomalacia. 2. Stable age related cerebral atrophy,  ventriculomegaly, periventricular white matter disease and remote lacunar infarcts. 3. No acute intracranial findings. Original Report Authenticated By: P. Kalman Jewels, M.D.  DG Chest 1 View. Reviewed by me. IMPRESSION: Low lung volumes with basilar opacities favored to represent atelectasis. No acute cardiopulmonary disease. Original Report Authenticated By: Rachel Moulds, M.D.  LABS Results for orders placed during the hospital encounter of 07/29/11  CBC  Component Value Range   WBC 10.7 (*) 4.0 - 10.5 (K/uL)   RBC 4.30  3.87 - 5.11 (MIL/uL)   Hemoglobin 12.7  12.0 - 15.0 (g/dL)   HCT 38.3  36.0 - 46.0 (%)   MCV 89.1  78.0 - 100.0 (fL)   MCH 29.5  26.0 - 34.0 (pg)   MCHC 33.2  30.0 - 36.0 (g/dL)   RDW 13.7  11.5 - 15.5 (%)   Platelets 305  150 - 400 (K/uL)  DIFFERENTIAL      Component Value Range   Neutrophils Relative 56  43 - 77 (%)   Neutro Abs 6.0  1.7 - 7.7 (K/uL)   Lymphocytes Relative 32  12 - 46 (%)   Lymphs Abs 3.4  0.7 - 4.0 (K/uL)   Monocytes Relative 8  3 - 12 (%)   Monocytes Absolute 0.9  0.1 - 1.0 (K/uL)   Eosinophils Relative 4  0 - 5 (%)   Eosinophils Absolute 0.4  0.0 - 0.7 (K/uL)   Basophils Relative 0  0 - 1 (%)   Basophils Absolute 0.0  0.0 - 0.1 (K/uL)  COMPREHENSIVE METABOLIC PANEL      Component Value Range   Sodium 138  135 - 145 (mEq/L)   Potassium 3.6  3.5 - 5.1 (mEq/L)   Chloride 102  96 - 112 (mEq/L)   CO2 26  19 - 32 (mEq/L)   Glucose, Bld 121 (*) 70 - 99 (mg/dL)   BUN 8  6 - 23 (mg/dL)   Creatinine, Ser 0.71  0.50 - 1.10 (mg/dL)   Calcium 10.5  8.4 - 10.5 (mg/dL)   Total Protein 6.4  6.0 - 8.3 (g/dL)   Albumin 3.3 (*) 3.5 - 5.2 (g/dL)   AST 18  0 - 37 (U/L)   ALT 12  0 - 35 (U/L)   Alkaline Phosphatase 59  39 - 117 (U/L)   Total Bilirubin 0.3  0.3 - 1.2 (mg/dL)   GFR calc non Af Amer 78 (*) >90 (mL/min)   GFR calc Af Amer >90  >90 (mL/min)  URINALYSIS, ROUTINE W REFLEX MICROSCOPIC      Component Value Range   Color,  Urine YELLOW  YELLOW    APPearance CLEAR  CLEAR    Specific Gravity, Urine <1.005 (*) 1.005 - 1.030    pH 6.0  5.0 - 8.0    Glucose, UA NEGATIVE  NEGATIVE (mg/dL)   Hgb urine dipstick NEGATIVE  NEGATIVE    Bilirubin Urine NEGATIVE  NEGATIVE    Ketones, ur NEGATIVE  NEGATIVE (mg/dL)   Protein, ur NEGATIVE  NEGATIVE (mg/dL)   Urobilinogen, UA 0.2  0.0 - 1.0 (mg/dL)   Nitrite NEGATIVE  NEGATIVE    Leukocytes, UA TRACE (*) NEGATIVE   URINE MICROSCOPIC-ADD ON      Component Value Range   Squamous Epithelial / LPF RARE  RARE    WBC, UA 3-6  <3 (WBC/hpf)   Bacteria, UA RARE  RARE   Dg Chest 1 View  07/29/2011  *RADIOLOGY REPORT*  Clinical Data: Altered level of consciousness, history of stroke  CHEST - 1 VIEW  Comparison: 02/27/2011; 05/28/2008; 11/03/2005  Findings: Grossly unchanged and enlarged cardiac silhouette and mediastinal contours given decreased lung volumes.  There is persistent tortuosity of the thoracic aorta.  Post median sternotomy and CABG.  Bibasilar heterogeneous opacities favored to represent atelectasis.  No definite focal airspace opacities.  No definite pleural effusion or pneumothorax.  Unchanged bones.  Right upper quadrant  surgical clips.  IMPRESSION: Low lung volumes with basilar opacities favored to represent atelectasis.  No acute cardiopulmonary disease.  Original Report Authenticated By: Rachel Moulds, M.D.   Ct Head Wo Contrast  07/29/2011  *RADIOLOGY REPORT*  Clinical Data: Altered mental status.  CT HEAD WITHOUT CONTRAST  Technique:  Contiguous axial images were obtained from the base of the skull through the vertex without contrast.  Comparison: Head CT 05/15/2011.  Findings: Resolution of the right cerebellar hematoma.  There is underlying encephalomalacia.  Mild ex vacuo dilatation of the fourth ventricle.  Stable cerebral atrophy and periventricular white matter disease.  No CT findings for acute hemispheric infarction or intracranial hemorrhage.  The bony  structures are intact.  The paranasal sinuses and mastoid air cells are clear.  IMPRESSION:  1.  Resolution of right cerebellar hematoma with residual encephalomalacia. 2.  Stable age related cerebral atrophy, ventriculomegaly, periventricular white matter disease and remote lacunar infarcts. 3.  No acute intracranial findings.  Original Report Authenticated By: P. Kalman Jewels, M.D.     COORDINATION OF CARE: 5:57pm:  - Patient evaluated by ED physician, Zofran, CT Head, DG Chest, CBC, Diff, CMP, UA ordered 2000 Spoke with patient and caregiver re findings. CT, chest xray  and labs negative for acute process. No explanation for dizziness.     MDM  Nursing home patient with dizziness, nausea and vomiting associated with some increased confusion. Patient is s/p cerebellar hemorrhagic stroke, in rehab. Increased dizziness could be due to new cerebellar infarct. CT is negative for evidence of acute bleed. MRI to be scheduled as an out patient for completion of the work up. Will initiate antivert and antiemetic. Patient received IVF, antiemetic. Able to take PO fluids.Pt stable in ED with no significant deterioration in condition.The patient appears reasonably screened and/or stabilized for discharge and I doubt any other medical condition or other [facility] requiring further screening, evaluation, or treatment in the ED at this time prior to discharge.   I personally performed the services described in this documentation, which was scribed in my presence. The recorded information has been reviewed and considered.  MDM Reviewed: nursing note and vitals Interpretation: labs, x-ray and CT scan       Gypsy Balsam. Olin Hauser, MD 07/29/11 2014

## 2011-07-29 NOTE — ED Notes (Signed)
Attempted to call report to [facility] x 3. No answer x 2. Someone answered the phone and transferred me; however no one answered at that number. No answer with repeat call. Gave report to son in the room with patient and discharge papers sent back to [facility] with patient. Family member verbalized understanding.

## 2011-07-29 NOTE — ED Notes (Signed)
Patient's granddaughter, [name], stated she has to leave and would like [place] to "call me and let me know of the lab results and what they are going to do." Number is [phone].

## 2011-07-29 NOTE — Discharge Instructions (Signed)
LAB WORK , CHEST XRAY AND CT OF THE HEAD DID NOT SHOW ANY NEW FINDINGS OR AN EXPLANATION FOR THE DIZZINESS. WE HAVE STARTED BOTH A MEDICINE FOR DIZZINESS AND A MEDICINE FOR NAUSEA. SHE WILL BE SCHEDULED FOR AN MRI. THE [facility] WILL CALL WITH THE APPOINTMENT TIME.

## 2011-08-13 DIAGNOSIS — E785 Hyperlipidemia, unspecified: Secondary | ICD-10-CM | POA: Diagnosis not present

## 2011-08-13 DIAGNOSIS — I69991 Dysphagia following unspecified cerebrovascular disease: Secondary | ICD-10-CM | POA: Diagnosis not present

## 2011-08-13 DIAGNOSIS — E039 Hypothyroidism, unspecified: Secondary | ICD-10-CM | POA: Diagnosis not present

## 2011-08-13 DIAGNOSIS — H4720 Unspecified optic atrophy: Secondary | ICD-10-CM | POA: Diagnosis not present

## 2011-08-13 DIAGNOSIS — I69928 Other speech and language deficits following unspecified cerebrovascular disease: Secondary | ICD-10-CM | POA: Diagnosis not present

## 2011-08-13 DIAGNOSIS — N390 Urinary tract infection, site not specified: Secondary | ICD-10-CM | POA: Diagnosis not present

## 2011-08-13 DIAGNOSIS — I619 Nontraumatic intracerebral hemorrhage, unspecified: Secondary | ICD-10-CM | POA: Diagnosis not present

## 2011-08-13 DIAGNOSIS — I259 Chronic ischemic heart disease, unspecified: Secondary | ICD-10-CM | POA: Diagnosis not present

## 2011-08-13 DIAGNOSIS — M6281 Muscle weakness (generalized): Secondary | ICD-10-CM | POA: Diagnosis not present

## 2011-08-13 DIAGNOSIS — K219 Gastro-esophageal reflux disease without esophagitis: Secondary | ICD-10-CM | POA: Diagnosis not present

## 2011-08-13 DIAGNOSIS — E119 Type 2 diabetes mellitus without complications: Secondary | ICD-10-CM | POA: Diagnosis not present

## 2011-08-13 DIAGNOSIS — R269 Unspecified abnormalities of gait and mobility: Secondary | ICD-10-CM | POA: Diagnosis not present

## 2011-08-13 DIAGNOSIS — K279 Peptic ulcer, site unspecified, unspecified as acute or chronic, without hemorrhage or perforation: Secondary | ICD-10-CM | POA: Diagnosis not present

## 2011-08-13 DIAGNOSIS — I10 Essential (primary) hypertension: Secondary | ICD-10-CM | POA: Diagnosis not present

## 2011-08-16 DIAGNOSIS — H4720 Unspecified optic atrophy: Secondary | ICD-10-CM | POA: Diagnosis not present

## 2011-09-14 DIAGNOSIS — I10 Essential (primary) hypertension: Secondary | ICD-10-CM | POA: Diagnosis not present

## 2011-09-14 DIAGNOSIS — M6281 Muscle weakness (generalized): Secondary | ICD-10-CM | POA: Diagnosis not present

## 2011-09-14 DIAGNOSIS — R262 Difficulty in walking, not elsewhere classified: Secondary | ICD-10-CM | POA: Diagnosis not present

## 2011-09-14 DIAGNOSIS — R269 Unspecified abnormalities of gait and mobility: Secondary | ICD-10-CM | POA: Diagnosis not present

## 2011-09-14 DIAGNOSIS — R471 Dysarthria and anarthria: Secondary | ICD-10-CM | POA: Diagnosis not present

## 2011-09-14 DIAGNOSIS — R4189 Other symptoms and signs involving cognitive functions and awareness: Secondary | ICD-10-CM | POA: Diagnosis not present

## 2011-09-14 DIAGNOSIS — D529 Folate deficiency anemia, unspecified: Secondary | ICD-10-CM | POA: Diagnosis not present

## 2011-09-14 DIAGNOSIS — I619 Nontraumatic intracerebral hemorrhage, unspecified: Secondary | ICD-10-CM | POA: Diagnosis not present

## 2011-09-14 DIAGNOSIS — E7800 Pure hypercholesterolemia, unspecified: Secondary | ICD-10-CM | POA: Diagnosis not present

## 2011-09-14 DIAGNOSIS — E119 Type 2 diabetes mellitus without complications: Secondary | ICD-10-CM | POA: Diagnosis not present

## 2011-09-15 DIAGNOSIS — R262 Difficulty in walking, not elsewhere classified: Secondary | ICD-10-CM | POA: Diagnosis not present

## 2011-09-15 DIAGNOSIS — R269 Unspecified abnormalities of gait and mobility: Secondary | ICD-10-CM | POA: Diagnosis not present

## 2011-09-15 DIAGNOSIS — R471 Dysarthria and anarthria: Secondary | ICD-10-CM | POA: Diagnosis not present

## 2011-09-15 DIAGNOSIS — I619 Nontraumatic intracerebral hemorrhage, unspecified: Secondary | ICD-10-CM | POA: Diagnosis not present

## 2011-09-15 DIAGNOSIS — R4189 Other symptoms and signs involving cognitive functions and awareness: Secondary | ICD-10-CM | POA: Diagnosis not present

## 2011-09-15 DIAGNOSIS — M6281 Muscle weakness (generalized): Secondary | ICD-10-CM | POA: Diagnosis not present

## 2011-09-16 DIAGNOSIS — R262 Difficulty in walking, not elsewhere classified: Secondary | ICD-10-CM | POA: Diagnosis not present

## 2011-09-16 DIAGNOSIS — M6281 Muscle weakness (generalized): Secondary | ICD-10-CM | POA: Diagnosis not present

## 2011-09-16 DIAGNOSIS — I619 Nontraumatic intracerebral hemorrhage, unspecified: Secondary | ICD-10-CM | POA: Diagnosis not present

## 2011-09-16 DIAGNOSIS — R471 Dysarthria and anarthria: Secondary | ICD-10-CM | POA: Diagnosis not present

## 2011-09-16 DIAGNOSIS — R4189 Other symptoms and signs involving cognitive functions and awareness: Secondary | ICD-10-CM | POA: Diagnosis not present

## 2011-09-16 DIAGNOSIS — R269 Unspecified abnormalities of gait and mobility: Secondary | ICD-10-CM | POA: Diagnosis not present

## 2011-09-17 DIAGNOSIS — R471 Dysarthria and anarthria: Secondary | ICD-10-CM | POA: Diagnosis not present

## 2011-09-17 DIAGNOSIS — R4189 Other symptoms and signs involving cognitive functions and awareness: Secondary | ICD-10-CM | POA: Diagnosis not present

## 2011-09-17 DIAGNOSIS — R269 Unspecified abnormalities of gait and mobility: Secondary | ICD-10-CM | POA: Diagnosis not present

## 2011-09-17 DIAGNOSIS — M6281 Muscle weakness (generalized): Secondary | ICD-10-CM | POA: Diagnosis not present

## 2011-09-17 DIAGNOSIS — R262 Difficulty in walking, not elsewhere classified: Secondary | ICD-10-CM | POA: Diagnosis not present

## 2011-09-17 DIAGNOSIS — I619 Nontraumatic intracerebral hemorrhage, unspecified: Secondary | ICD-10-CM | POA: Diagnosis not present

## 2011-09-18 DIAGNOSIS — F341 Dysthymic disorder: Secondary | ICD-10-CM | POA: Diagnosis not present

## 2011-09-18 DIAGNOSIS — I619 Nontraumatic intracerebral hemorrhage, unspecified: Secondary | ICD-10-CM | POA: Diagnosis not present

## 2011-09-18 DIAGNOSIS — R262 Difficulty in walking, not elsewhere classified: Secondary | ICD-10-CM | POA: Diagnosis not present

## 2011-09-18 DIAGNOSIS — R471 Dysarthria and anarthria: Secondary | ICD-10-CM | POA: Diagnosis not present

## 2011-09-18 DIAGNOSIS — M6281 Muscle weakness (generalized): Secondary | ICD-10-CM | POA: Diagnosis not present

## 2011-09-18 DIAGNOSIS — I6350 Cerebral infarction due to unspecified occlusion or stenosis of unspecified cerebral artery: Secondary | ICD-10-CM | POA: Diagnosis not present

## 2011-09-18 DIAGNOSIS — R269 Unspecified abnormalities of gait and mobility: Secondary | ICD-10-CM | POA: Diagnosis not present

## 2011-09-18 DIAGNOSIS — R4189 Other symptoms and signs involving cognitive functions and awareness: Secondary | ICD-10-CM | POA: Diagnosis not present

## 2011-09-18 DIAGNOSIS — R350 Frequency of micturition: Secondary | ICD-10-CM | POA: Diagnosis not present

## 2011-09-18 DIAGNOSIS — F0390 Unspecified dementia without behavioral disturbance: Secondary | ICD-10-CM | POA: Diagnosis not present

## 2011-09-18 DIAGNOSIS — E119 Type 2 diabetes mellitus without complications: Secondary | ICD-10-CM | POA: Diagnosis not present

## 2011-09-20 DIAGNOSIS — M6281 Muscle weakness (generalized): Secondary | ICD-10-CM | POA: Diagnosis not present

## 2011-09-20 DIAGNOSIS — I619 Nontraumatic intracerebral hemorrhage, unspecified: Secondary | ICD-10-CM | POA: Diagnosis not present

## 2011-09-20 DIAGNOSIS — R471 Dysarthria and anarthria: Secondary | ICD-10-CM | POA: Diagnosis not present

## 2011-09-20 DIAGNOSIS — R4189 Other symptoms and signs involving cognitive functions and awareness: Secondary | ICD-10-CM | POA: Diagnosis not present

## 2011-09-20 DIAGNOSIS — R262 Difficulty in walking, not elsewhere classified: Secondary | ICD-10-CM | POA: Diagnosis not present

## 2011-09-20 DIAGNOSIS — R269 Unspecified abnormalities of gait and mobility: Secondary | ICD-10-CM | POA: Diagnosis not present

## 2011-09-21 DIAGNOSIS — R269 Unspecified abnormalities of gait and mobility: Secondary | ICD-10-CM | POA: Diagnosis not present

## 2011-09-21 DIAGNOSIS — R262 Difficulty in walking, not elsewhere classified: Secondary | ICD-10-CM | POA: Diagnosis not present

## 2011-09-21 DIAGNOSIS — R4189 Other symptoms and signs involving cognitive functions and awareness: Secondary | ICD-10-CM | POA: Diagnosis not present

## 2011-09-21 DIAGNOSIS — R471 Dysarthria and anarthria: Secondary | ICD-10-CM | POA: Diagnosis not present

## 2011-09-21 DIAGNOSIS — I619 Nontraumatic intracerebral hemorrhage, unspecified: Secondary | ICD-10-CM | POA: Diagnosis not present

## 2011-09-21 DIAGNOSIS — M6281 Muscle weakness (generalized): Secondary | ICD-10-CM | POA: Diagnosis not present

## 2011-09-22 DIAGNOSIS — I619 Nontraumatic intracerebral hemorrhage, unspecified: Secondary | ICD-10-CM | POA: Diagnosis not present

## 2011-09-22 DIAGNOSIS — M6281 Muscle weakness (generalized): Secondary | ICD-10-CM | POA: Diagnosis not present

## 2011-09-22 DIAGNOSIS — R262 Difficulty in walking, not elsewhere classified: Secondary | ICD-10-CM | POA: Diagnosis not present

## 2011-09-22 DIAGNOSIS — R4189 Other symptoms and signs involving cognitive functions and awareness: Secondary | ICD-10-CM | POA: Diagnosis not present

## 2011-09-22 DIAGNOSIS — R269 Unspecified abnormalities of gait and mobility: Secondary | ICD-10-CM | POA: Diagnosis not present

## 2011-09-22 DIAGNOSIS — R471 Dysarthria and anarthria: Secondary | ICD-10-CM | POA: Diagnosis not present

## 2011-09-23 DIAGNOSIS — I619 Nontraumatic intracerebral hemorrhage, unspecified: Secondary | ICD-10-CM | POA: Diagnosis not present

## 2011-09-23 DIAGNOSIS — M6281 Muscle weakness (generalized): Secondary | ICD-10-CM | POA: Diagnosis not present

## 2011-09-23 DIAGNOSIS — R4189 Other symptoms and signs involving cognitive functions and awareness: Secondary | ICD-10-CM | POA: Diagnosis not present

## 2011-09-23 DIAGNOSIS — R262 Difficulty in walking, not elsewhere classified: Secondary | ICD-10-CM | POA: Diagnosis not present

## 2011-09-23 DIAGNOSIS — R471 Dysarthria and anarthria: Secondary | ICD-10-CM | POA: Diagnosis not present

## 2011-09-23 DIAGNOSIS — R269 Unspecified abnormalities of gait and mobility: Secondary | ICD-10-CM | POA: Diagnosis not present

## 2011-09-24 DIAGNOSIS — I619 Nontraumatic intracerebral hemorrhage, unspecified: Secondary | ICD-10-CM | POA: Diagnosis not present

## 2011-09-24 DIAGNOSIS — R269 Unspecified abnormalities of gait and mobility: Secondary | ICD-10-CM | POA: Diagnosis not present

## 2011-09-24 DIAGNOSIS — M6281 Muscle weakness (generalized): Secondary | ICD-10-CM | POA: Diagnosis not present

## 2011-09-24 DIAGNOSIS — R262 Difficulty in walking, not elsewhere classified: Secondary | ICD-10-CM | POA: Diagnosis not present

## 2011-09-24 DIAGNOSIS — R4189 Other symptoms and signs involving cognitive functions and awareness: Secondary | ICD-10-CM | POA: Diagnosis not present

## 2011-09-24 DIAGNOSIS — R471 Dysarthria and anarthria: Secondary | ICD-10-CM | POA: Diagnosis not present

## 2011-09-25 DIAGNOSIS — M6281 Muscle weakness (generalized): Secondary | ICD-10-CM | POA: Diagnosis not present

## 2011-09-25 DIAGNOSIS — I619 Nontraumatic intracerebral hemorrhage, unspecified: Secondary | ICD-10-CM | POA: Diagnosis not present

## 2011-09-25 DIAGNOSIS — R262 Difficulty in walking, not elsewhere classified: Secondary | ICD-10-CM | POA: Diagnosis not present

## 2011-09-25 DIAGNOSIS — R471 Dysarthria and anarthria: Secondary | ICD-10-CM | POA: Diagnosis not present

## 2011-09-25 DIAGNOSIS — R269 Unspecified abnormalities of gait and mobility: Secondary | ICD-10-CM | POA: Diagnosis not present

## 2011-09-25 DIAGNOSIS — R4189 Other symptoms and signs involving cognitive functions and awareness: Secondary | ICD-10-CM | POA: Diagnosis not present

## 2011-09-26 DIAGNOSIS — R4189 Other symptoms and signs involving cognitive functions and awareness: Secondary | ICD-10-CM | POA: Diagnosis not present

## 2011-09-26 DIAGNOSIS — R262 Difficulty in walking, not elsewhere classified: Secondary | ICD-10-CM | POA: Diagnosis not present

## 2011-09-26 DIAGNOSIS — M6281 Muscle weakness (generalized): Secondary | ICD-10-CM | POA: Diagnosis not present

## 2011-09-26 DIAGNOSIS — I619 Nontraumatic intracerebral hemorrhage, unspecified: Secondary | ICD-10-CM | POA: Diagnosis not present

## 2011-09-26 DIAGNOSIS — R269 Unspecified abnormalities of gait and mobility: Secondary | ICD-10-CM | POA: Diagnosis not present

## 2011-09-26 DIAGNOSIS — R471 Dysarthria and anarthria: Secondary | ICD-10-CM | POA: Diagnosis not present

## 2011-09-27 DIAGNOSIS — R262 Difficulty in walking, not elsewhere classified: Secondary | ICD-10-CM | POA: Diagnosis not present

## 2011-09-27 DIAGNOSIS — R471 Dysarthria and anarthria: Secondary | ICD-10-CM | POA: Diagnosis not present

## 2011-09-27 DIAGNOSIS — I619 Nontraumatic intracerebral hemorrhage, unspecified: Secondary | ICD-10-CM | POA: Diagnosis not present

## 2011-09-27 DIAGNOSIS — M6281 Muscle weakness (generalized): Secondary | ICD-10-CM | POA: Diagnosis not present

## 2011-09-27 DIAGNOSIS — R269 Unspecified abnormalities of gait and mobility: Secondary | ICD-10-CM | POA: Diagnosis not present

## 2011-09-27 DIAGNOSIS — R4189 Other symptoms and signs involving cognitive functions and awareness: Secondary | ICD-10-CM | POA: Diagnosis not present

## 2011-09-28 DIAGNOSIS — I619 Nontraumatic intracerebral hemorrhage, unspecified: Secondary | ICD-10-CM | POA: Diagnosis not present

## 2011-09-28 DIAGNOSIS — R262 Difficulty in walking, not elsewhere classified: Secondary | ICD-10-CM | POA: Diagnosis not present

## 2011-09-28 DIAGNOSIS — R471 Dysarthria and anarthria: Secondary | ICD-10-CM | POA: Diagnosis not present

## 2011-09-28 DIAGNOSIS — R269 Unspecified abnormalities of gait and mobility: Secondary | ICD-10-CM | POA: Diagnosis not present

## 2011-09-28 DIAGNOSIS — R4189 Other symptoms and signs involving cognitive functions and awareness: Secondary | ICD-10-CM | POA: Diagnosis not present

## 2011-09-28 DIAGNOSIS — M6281 Muscle weakness (generalized): Secondary | ICD-10-CM | POA: Diagnosis not present

## 2011-09-29 DIAGNOSIS — R471 Dysarthria and anarthria: Secondary | ICD-10-CM | POA: Diagnosis not present

## 2011-09-29 DIAGNOSIS — R269 Unspecified abnormalities of gait and mobility: Secondary | ICD-10-CM | POA: Diagnosis not present

## 2011-09-29 DIAGNOSIS — R262 Difficulty in walking, not elsewhere classified: Secondary | ICD-10-CM | POA: Diagnosis not present

## 2011-09-29 DIAGNOSIS — I619 Nontraumatic intracerebral hemorrhage, unspecified: Secondary | ICD-10-CM | POA: Diagnosis not present

## 2011-09-29 DIAGNOSIS — R4189 Other symptoms and signs involving cognitive functions and awareness: Secondary | ICD-10-CM | POA: Diagnosis not present

## 2011-09-29 DIAGNOSIS — M6281 Muscle weakness (generalized): Secondary | ICD-10-CM | POA: Diagnosis not present

## 2011-09-30 DIAGNOSIS — R471 Dysarthria and anarthria: Secondary | ICD-10-CM | POA: Diagnosis not present

## 2011-09-30 DIAGNOSIS — R4189 Other symptoms and signs involving cognitive functions and awareness: Secondary | ICD-10-CM | POA: Diagnosis not present

## 2011-09-30 DIAGNOSIS — R269 Unspecified abnormalities of gait and mobility: Secondary | ICD-10-CM | POA: Diagnosis not present

## 2011-09-30 DIAGNOSIS — M6281 Muscle weakness (generalized): Secondary | ICD-10-CM | POA: Diagnosis not present

## 2011-09-30 DIAGNOSIS — I619 Nontraumatic intracerebral hemorrhage, unspecified: Secondary | ICD-10-CM | POA: Diagnosis not present

## 2011-09-30 DIAGNOSIS — R262 Difficulty in walking, not elsewhere classified: Secondary | ICD-10-CM | POA: Diagnosis not present

## 2011-10-01 DIAGNOSIS — R269 Unspecified abnormalities of gait and mobility: Secondary | ICD-10-CM | POA: Diagnosis not present

## 2011-10-01 DIAGNOSIS — R471 Dysarthria and anarthria: Secondary | ICD-10-CM | POA: Diagnosis not present

## 2011-10-01 DIAGNOSIS — M6281 Muscle weakness (generalized): Secondary | ICD-10-CM | POA: Diagnosis not present

## 2011-10-01 DIAGNOSIS — M25519 Pain in unspecified shoulder: Secondary | ICD-10-CM | POA: Diagnosis not present

## 2011-10-01 DIAGNOSIS — R262 Difficulty in walking, not elsewhere classified: Secondary | ICD-10-CM | POA: Diagnosis not present

## 2011-10-01 DIAGNOSIS — I619 Nontraumatic intracerebral hemorrhage, unspecified: Secondary | ICD-10-CM | POA: Diagnosis not present

## 2011-10-01 DIAGNOSIS — R4189 Other symptoms and signs involving cognitive functions and awareness: Secondary | ICD-10-CM | POA: Diagnosis not present

## 2011-10-02 DIAGNOSIS — I619 Nontraumatic intracerebral hemorrhage, unspecified: Secondary | ICD-10-CM | POA: Diagnosis not present

## 2011-10-02 DIAGNOSIS — R4189 Other symptoms and signs involving cognitive functions and awareness: Secondary | ICD-10-CM | POA: Diagnosis not present

## 2011-10-02 DIAGNOSIS — R471 Dysarthria and anarthria: Secondary | ICD-10-CM | POA: Diagnosis not present

## 2011-10-02 DIAGNOSIS — M6281 Muscle weakness (generalized): Secondary | ICD-10-CM | POA: Diagnosis not present

## 2011-10-02 DIAGNOSIS — R262 Difficulty in walking, not elsewhere classified: Secondary | ICD-10-CM | POA: Diagnosis not present

## 2011-10-02 DIAGNOSIS — R269 Unspecified abnormalities of gait and mobility: Secondary | ICD-10-CM | POA: Diagnosis not present

## 2011-10-03 DIAGNOSIS — I619 Nontraumatic intracerebral hemorrhage, unspecified: Secondary | ICD-10-CM | POA: Diagnosis not present

## 2011-10-03 DIAGNOSIS — R262 Difficulty in walking, not elsewhere classified: Secondary | ICD-10-CM | POA: Diagnosis not present

## 2011-10-03 DIAGNOSIS — R471 Dysarthria and anarthria: Secondary | ICD-10-CM | POA: Diagnosis not present

## 2011-10-03 DIAGNOSIS — M6281 Muscle weakness (generalized): Secondary | ICD-10-CM | POA: Diagnosis not present

## 2011-10-03 DIAGNOSIS — R4189 Other symptoms and signs involving cognitive functions and awareness: Secondary | ICD-10-CM | POA: Diagnosis not present

## 2011-10-03 DIAGNOSIS — R269 Unspecified abnormalities of gait and mobility: Secondary | ICD-10-CM | POA: Diagnosis not present

## 2011-10-04 DIAGNOSIS — R471 Dysarthria and anarthria: Secondary | ICD-10-CM | POA: Diagnosis not present

## 2011-10-04 DIAGNOSIS — R269 Unspecified abnormalities of gait and mobility: Secondary | ICD-10-CM | POA: Diagnosis not present

## 2011-10-04 DIAGNOSIS — R262 Difficulty in walking, not elsewhere classified: Secondary | ICD-10-CM | POA: Diagnosis not present

## 2011-10-04 DIAGNOSIS — R4189 Other symptoms and signs involving cognitive functions and awareness: Secondary | ICD-10-CM | POA: Diagnosis not present

## 2011-10-04 DIAGNOSIS — M6281 Muscle weakness (generalized): Secondary | ICD-10-CM | POA: Diagnosis not present

## 2011-10-04 DIAGNOSIS — I619 Nontraumatic intracerebral hemorrhage, unspecified: Secondary | ICD-10-CM | POA: Diagnosis not present

## 2011-10-05 DIAGNOSIS — R4189 Other symptoms and signs involving cognitive functions and awareness: Secondary | ICD-10-CM | POA: Diagnosis not present

## 2011-10-05 DIAGNOSIS — I619 Nontraumatic intracerebral hemorrhage, unspecified: Secondary | ICD-10-CM | POA: Diagnosis not present

## 2011-10-05 DIAGNOSIS — R471 Dysarthria and anarthria: Secondary | ICD-10-CM | POA: Diagnosis not present

## 2011-10-05 DIAGNOSIS — M6281 Muscle weakness (generalized): Secondary | ICD-10-CM | POA: Diagnosis not present

## 2011-10-05 DIAGNOSIS — R269 Unspecified abnormalities of gait and mobility: Secondary | ICD-10-CM | POA: Diagnosis not present

## 2011-10-05 DIAGNOSIS — R262 Difficulty in walking, not elsewhere classified: Secondary | ICD-10-CM | POA: Diagnosis not present

## 2011-10-06 DIAGNOSIS — R269 Unspecified abnormalities of gait and mobility: Secondary | ICD-10-CM | POA: Diagnosis not present

## 2011-10-06 DIAGNOSIS — R471 Dysarthria and anarthria: Secondary | ICD-10-CM | POA: Diagnosis not present

## 2011-10-06 DIAGNOSIS — M6281 Muscle weakness (generalized): Secondary | ICD-10-CM | POA: Diagnosis not present

## 2011-10-06 DIAGNOSIS — I619 Nontraumatic intracerebral hemorrhage, unspecified: Secondary | ICD-10-CM | POA: Diagnosis not present

## 2011-10-06 DIAGNOSIS — R262 Difficulty in walking, not elsewhere classified: Secondary | ICD-10-CM | POA: Diagnosis not present

## 2011-10-06 DIAGNOSIS — R4189 Other symptoms and signs involving cognitive functions and awareness: Secondary | ICD-10-CM | POA: Diagnosis not present

## 2011-10-07 DIAGNOSIS — R4189 Other symptoms and signs involving cognitive functions and awareness: Secondary | ICD-10-CM | POA: Diagnosis not present

## 2011-10-07 DIAGNOSIS — M6281 Muscle weakness (generalized): Secondary | ICD-10-CM | POA: Diagnosis not present

## 2011-10-07 DIAGNOSIS — I619 Nontraumatic intracerebral hemorrhage, unspecified: Secondary | ICD-10-CM | POA: Diagnosis not present

## 2011-10-07 DIAGNOSIS — R262 Difficulty in walking, not elsewhere classified: Secondary | ICD-10-CM | POA: Diagnosis not present

## 2011-10-07 DIAGNOSIS — R269 Unspecified abnormalities of gait and mobility: Secondary | ICD-10-CM | POA: Diagnosis not present

## 2011-10-07 DIAGNOSIS — R471 Dysarthria and anarthria: Secondary | ICD-10-CM | POA: Diagnosis not present

## 2011-10-08 DIAGNOSIS — R4189 Other symptoms and signs involving cognitive functions and awareness: Secondary | ICD-10-CM | POA: Diagnosis not present

## 2011-10-08 DIAGNOSIS — R262 Difficulty in walking, not elsewhere classified: Secondary | ICD-10-CM | POA: Diagnosis not present

## 2011-10-08 DIAGNOSIS — I619 Nontraumatic intracerebral hemorrhage, unspecified: Secondary | ICD-10-CM | POA: Diagnosis not present

## 2011-10-08 DIAGNOSIS — M6281 Muscle weakness (generalized): Secondary | ICD-10-CM | POA: Diagnosis not present

## 2011-10-08 DIAGNOSIS — R471 Dysarthria and anarthria: Secondary | ICD-10-CM | POA: Diagnosis not present

## 2011-10-08 DIAGNOSIS — R269 Unspecified abnormalities of gait and mobility: Secondary | ICD-10-CM | POA: Diagnosis not present

## 2011-10-09 DIAGNOSIS — M6281 Muscle weakness (generalized): Secondary | ICD-10-CM | POA: Diagnosis not present

## 2011-10-09 DIAGNOSIS — R262 Difficulty in walking, not elsewhere classified: Secondary | ICD-10-CM | POA: Diagnosis not present

## 2011-10-09 DIAGNOSIS — R471 Dysarthria and anarthria: Secondary | ICD-10-CM | POA: Diagnosis not present

## 2011-10-09 DIAGNOSIS — I619 Nontraumatic intracerebral hemorrhage, unspecified: Secondary | ICD-10-CM | POA: Diagnosis not present

## 2011-10-09 DIAGNOSIS — R4189 Other symptoms and signs involving cognitive functions and awareness: Secondary | ICD-10-CM | POA: Diagnosis not present

## 2011-10-09 DIAGNOSIS — R269 Unspecified abnormalities of gait and mobility: Secondary | ICD-10-CM | POA: Diagnosis not present

## 2011-10-11 DIAGNOSIS — I619 Nontraumatic intracerebral hemorrhage, unspecified: Secondary | ICD-10-CM | POA: Diagnosis not present

## 2011-10-11 DIAGNOSIS — R269 Unspecified abnormalities of gait and mobility: Secondary | ICD-10-CM | POA: Diagnosis not present

## 2011-10-11 DIAGNOSIS — R4189 Other symptoms and signs involving cognitive functions and awareness: Secondary | ICD-10-CM | POA: Diagnosis not present

## 2011-10-11 DIAGNOSIS — R471 Dysarthria and anarthria: Secondary | ICD-10-CM | POA: Diagnosis not present

## 2011-10-11 DIAGNOSIS — R262 Difficulty in walking, not elsewhere classified: Secondary | ICD-10-CM | POA: Diagnosis not present

## 2011-10-11 DIAGNOSIS — M6281 Muscle weakness (generalized): Secondary | ICD-10-CM | POA: Diagnosis not present

## 2011-10-12 DIAGNOSIS — R4189 Other symptoms and signs involving cognitive functions and awareness: Secondary | ICD-10-CM | POA: Diagnosis not present

## 2011-10-12 DIAGNOSIS — R471 Dysarthria and anarthria: Secondary | ICD-10-CM | POA: Diagnosis not present

## 2011-10-12 DIAGNOSIS — I619 Nontraumatic intracerebral hemorrhage, unspecified: Secondary | ICD-10-CM | POA: Diagnosis not present

## 2011-10-12 DIAGNOSIS — R262 Difficulty in walking, not elsewhere classified: Secondary | ICD-10-CM | POA: Diagnosis not present

## 2011-10-12 DIAGNOSIS — R269 Unspecified abnormalities of gait and mobility: Secondary | ICD-10-CM | POA: Diagnosis not present

## 2011-10-12 DIAGNOSIS — M6281 Muscle weakness (generalized): Secondary | ICD-10-CM | POA: Diagnosis not present

## 2011-10-13 DIAGNOSIS — I619 Nontraumatic intracerebral hemorrhage, unspecified: Secondary | ICD-10-CM | POA: Diagnosis not present

## 2011-10-13 DIAGNOSIS — R262 Difficulty in walking, not elsewhere classified: Secondary | ICD-10-CM | POA: Diagnosis not present

## 2011-10-13 DIAGNOSIS — R269 Unspecified abnormalities of gait and mobility: Secondary | ICD-10-CM | POA: Diagnosis not present

## 2011-10-13 DIAGNOSIS — R471 Dysarthria and anarthria: Secondary | ICD-10-CM | POA: Diagnosis not present

## 2011-10-13 DIAGNOSIS — R4189 Other symptoms and signs involving cognitive functions and awareness: Secondary | ICD-10-CM | POA: Diagnosis not present

## 2011-10-13 DIAGNOSIS — M6281 Muscle weakness (generalized): Secondary | ICD-10-CM | POA: Diagnosis not present

## 2011-10-14 DIAGNOSIS — R4189 Other symptoms and signs involving cognitive functions and awareness: Secondary | ICD-10-CM | POA: Diagnosis not present

## 2011-10-14 DIAGNOSIS — R262 Difficulty in walking, not elsewhere classified: Secondary | ICD-10-CM | POA: Diagnosis not present

## 2011-10-14 DIAGNOSIS — M6281 Muscle weakness (generalized): Secondary | ICD-10-CM | POA: Diagnosis not present

## 2011-10-14 DIAGNOSIS — R269 Unspecified abnormalities of gait and mobility: Secondary | ICD-10-CM | POA: Diagnosis not present

## 2011-10-14 DIAGNOSIS — R471 Dysarthria and anarthria: Secondary | ICD-10-CM | POA: Diagnosis not present

## 2011-10-14 DIAGNOSIS — I619 Nontraumatic intracerebral hemorrhage, unspecified: Secondary | ICD-10-CM | POA: Diagnosis not present

## 2011-10-15 DIAGNOSIS — R4189 Other symptoms and signs involving cognitive functions and awareness: Secondary | ICD-10-CM | POA: Diagnosis not present

## 2011-10-15 DIAGNOSIS — M6281 Muscle weakness (generalized): Secondary | ICD-10-CM | POA: Diagnosis not present

## 2011-10-15 DIAGNOSIS — I619 Nontraumatic intracerebral hemorrhage, unspecified: Secondary | ICD-10-CM | POA: Diagnosis not present

## 2011-10-15 DIAGNOSIS — R262 Difficulty in walking, not elsewhere classified: Secondary | ICD-10-CM | POA: Diagnosis not present

## 2011-10-15 DIAGNOSIS — R269 Unspecified abnormalities of gait and mobility: Secondary | ICD-10-CM | POA: Diagnosis not present

## 2011-10-15 DIAGNOSIS — R471 Dysarthria and anarthria: Secondary | ICD-10-CM | POA: Diagnosis not present

## 2011-10-18 DIAGNOSIS — R471 Dysarthria and anarthria: Secondary | ICD-10-CM | POA: Diagnosis not present

## 2011-10-18 DIAGNOSIS — R4189 Other symptoms and signs involving cognitive functions and awareness: Secondary | ICD-10-CM | POA: Diagnosis not present

## 2011-10-18 DIAGNOSIS — R269 Unspecified abnormalities of gait and mobility: Secondary | ICD-10-CM | POA: Diagnosis not present

## 2011-10-18 DIAGNOSIS — M6281 Muscle weakness (generalized): Secondary | ICD-10-CM | POA: Diagnosis not present

## 2011-10-18 DIAGNOSIS — I619 Nontraumatic intracerebral hemorrhage, unspecified: Secondary | ICD-10-CM | POA: Diagnosis not present

## 2011-10-18 DIAGNOSIS — R262 Difficulty in walking, not elsewhere classified: Secondary | ICD-10-CM | POA: Diagnosis not present

## 2011-10-19 DIAGNOSIS — R4189 Other symptoms and signs involving cognitive functions and awareness: Secondary | ICD-10-CM | POA: Diagnosis not present

## 2011-10-19 DIAGNOSIS — R269 Unspecified abnormalities of gait and mobility: Secondary | ICD-10-CM | POA: Diagnosis not present

## 2011-10-19 DIAGNOSIS — R471 Dysarthria and anarthria: Secondary | ICD-10-CM | POA: Diagnosis not present

## 2011-10-19 DIAGNOSIS — M6281 Muscle weakness (generalized): Secondary | ICD-10-CM | POA: Diagnosis not present

## 2011-10-19 DIAGNOSIS — R262 Difficulty in walking, not elsewhere classified: Secondary | ICD-10-CM | POA: Diagnosis not present

## 2011-10-19 DIAGNOSIS — I619 Nontraumatic intracerebral hemorrhage, unspecified: Secondary | ICD-10-CM | POA: Diagnosis not present

## 2011-10-20 DIAGNOSIS — R471 Dysarthria and anarthria: Secondary | ICD-10-CM | POA: Diagnosis not present

## 2011-10-20 DIAGNOSIS — R4189 Other symptoms and signs involving cognitive functions and awareness: Secondary | ICD-10-CM | POA: Diagnosis not present

## 2011-10-20 DIAGNOSIS — M6281 Muscle weakness (generalized): Secondary | ICD-10-CM | POA: Diagnosis not present

## 2011-10-20 DIAGNOSIS — R269 Unspecified abnormalities of gait and mobility: Secondary | ICD-10-CM | POA: Diagnosis not present

## 2011-10-20 DIAGNOSIS — R262 Difficulty in walking, not elsewhere classified: Secondary | ICD-10-CM | POA: Diagnosis not present

## 2011-10-20 DIAGNOSIS — I619 Nontraumatic intracerebral hemorrhage, unspecified: Secondary | ICD-10-CM | POA: Diagnosis not present

## 2011-10-21 DIAGNOSIS — R262 Difficulty in walking, not elsewhere classified: Secondary | ICD-10-CM | POA: Diagnosis not present

## 2011-10-21 DIAGNOSIS — I619 Nontraumatic intracerebral hemorrhage, unspecified: Secondary | ICD-10-CM | POA: Diagnosis not present

## 2011-10-21 DIAGNOSIS — R4189 Other symptoms and signs involving cognitive functions and awareness: Secondary | ICD-10-CM | POA: Diagnosis not present

## 2011-10-21 DIAGNOSIS — N390 Urinary tract infection, site not specified: Secondary | ICD-10-CM | POA: Diagnosis not present

## 2011-10-21 DIAGNOSIS — R471 Dysarthria and anarthria: Secondary | ICD-10-CM | POA: Diagnosis not present

## 2011-10-21 DIAGNOSIS — M6281 Muscle weakness (generalized): Secondary | ICD-10-CM | POA: Diagnosis not present

## 2011-10-21 DIAGNOSIS — R269 Unspecified abnormalities of gait and mobility: Secondary | ICD-10-CM | POA: Diagnosis not present

## 2011-10-22 DIAGNOSIS — M6281 Muscle weakness (generalized): Secondary | ICD-10-CM | POA: Diagnosis not present

## 2011-10-22 DIAGNOSIS — R471 Dysarthria and anarthria: Secondary | ICD-10-CM | POA: Diagnosis not present

## 2011-10-22 DIAGNOSIS — R269 Unspecified abnormalities of gait and mobility: Secondary | ICD-10-CM | POA: Diagnosis not present

## 2011-10-22 DIAGNOSIS — R262 Difficulty in walking, not elsewhere classified: Secondary | ICD-10-CM | POA: Diagnosis not present

## 2011-10-22 DIAGNOSIS — I619 Nontraumatic intracerebral hemorrhage, unspecified: Secondary | ICD-10-CM | POA: Diagnosis not present

## 2011-10-22 DIAGNOSIS — R4189 Other symptoms and signs involving cognitive functions and awareness: Secondary | ICD-10-CM | POA: Diagnosis not present

## 2011-10-23 DIAGNOSIS — R269 Unspecified abnormalities of gait and mobility: Secondary | ICD-10-CM | POA: Diagnosis not present

## 2011-10-23 DIAGNOSIS — M6281 Muscle weakness (generalized): Secondary | ICD-10-CM | POA: Diagnosis not present

## 2011-10-23 DIAGNOSIS — R471 Dysarthria and anarthria: Secondary | ICD-10-CM | POA: Diagnosis not present

## 2011-10-23 DIAGNOSIS — I619 Nontraumatic intracerebral hemorrhage, unspecified: Secondary | ICD-10-CM | POA: Diagnosis not present

## 2011-10-23 DIAGNOSIS — R4189 Other symptoms and signs involving cognitive functions and awareness: Secondary | ICD-10-CM | POA: Diagnosis not present

## 2011-10-23 DIAGNOSIS — R262 Difficulty in walking, not elsewhere classified: Secondary | ICD-10-CM | POA: Diagnosis not present

## 2011-10-24 DIAGNOSIS — R471 Dysarthria and anarthria: Secondary | ICD-10-CM | POA: Diagnosis not present

## 2011-10-24 DIAGNOSIS — R4189 Other symptoms and signs involving cognitive functions and awareness: Secondary | ICD-10-CM | POA: Diagnosis not present

## 2011-10-24 DIAGNOSIS — M6281 Muscle weakness (generalized): Secondary | ICD-10-CM | POA: Diagnosis not present

## 2011-10-24 DIAGNOSIS — R269 Unspecified abnormalities of gait and mobility: Secondary | ICD-10-CM | POA: Diagnosis not present

## 2011-10-24 DIAGNOSIS — R262 Difficulty in walking, not elsewhere classified: Secondary | ICD-10-CM | POA: Diagnosis not present

## 2011-10-24 DIAGNOSIS — I619 Nontraumatic intracerebral hemorrhage, unspecified: Secondary | ICD-10-CM | POA: Diagnosis not present

## 2011-10-25 DIAGNOSIS — R471 Dysarthria and anarthria: Secondary | ICD-10-CM | POA: Diagnosis not present

## 2011-10-25 DIAGNOSIS — I619 Nontraumatic intracerebral hemorrhage, unspecified: Secondary | ICD-10-CM | POA: Diagnosis not present

## 2011-10-25 DIAGNOSIS — R4189 Other symptoms and signs involving cognitive functions and awareness: Secondary | ICD-10-CM | POA: Diagnosis not present

## 2011-10-25 DIAGNOSIS — R269 Unspecified abnormalities of gait and mobility: Secondary | ICD-10-CM | POA: Diagnosis not present

## 2011-10-25 DIAGNOSIS — M6281 Muscle weakness (generalized): Secondary | ICD-10-CM | POA: Diagnosis not present

## 2011-10-25 DIAGNOSIS — R262 Difficulty in walking, not elsewhere classified: Secondary | ICD-10-CM | POA: Diagnosis not present

## 2011-10-26 DIAGNOSIS — R269 Unspecified abnormalities of gait and mobility: Secondary | ICD-10-CM | POA: Diagnosis not present

## 2011-10-26 DIAGNOSIS — R4189 Other symptoms and signs involving cognitive functions and awareness: Secondary | ICD-10-CM | POA: Diagnosis not present

## 2011-10-26 DIAGNOSIS — R471 Dysarthria and anarthria: Secondary | ICD-10-CM | POA: Diagnosis not present

## 2011-10-26 DIAGNOSIS — I619 Nontraumatic intracerebral hemorrhage, unspecified: Secondary | ICD-10-CM | POA: Diagnosis not present

## 2011-10-26 DIAGNOSIS — M6281 Muscle weakness (generalized): Secondary | ICD-10-CM | POA: Diagnosis not present

## 2011-10-26 DIAGNOSIS — R262 Difficulty in walking, not elsewhere classified: Secondary | ICD-10-CM | POA: Diagnosis not present

## 2011-10-27 DIAGNOSIS — R262 Difficulty in walking, not elsewhere classified: Secondary | ICD-10-CM | POA: Diagnosis not present

## 2011-10-27 DIAGNOSIS — R269 Unspecified abnormalities of gait and mobility: Secondary | ICD-10-CM | POA: Diagnosis not present

## 2011-10-27 DIAGNOSIS — R4189 Other symptoms and signs involving cognitive functions and awareness: Secondary | ICD-10-CM | POA: Diagnosis not present

## 2011-10-27 DIAGNOSIS — I619 Nontraumatic intracerebral hemorrhage, unspecified: Secondary | ICD-10-CM | POA: Diagnosis not present

## 2011-10-27 DIAGNOSIS — R471 Dysarthria and anarthria: Secondary | ICD-10-CM | POA: Diagnosis not present

## 2011-10-27 DIAGNOSIS — M6281 Muscle weakness (generalized): Secondary | ICD-10-CM | POA: Diagnosis not present

## 2011-10-28 DIAGNOSIS — R471 Dysarthria and anarthria: Secondary | ICD-10-CM | POA: Diagnosis not present

## 2011-10-28 DIAGNOSIS — M6281 Muscle weakness (generalized): Secondary | ICD-10-CM | POA: Diagnosis not present

## 2011-10-28 DIAGNOSIS — R4189 Other symptoms and signs involving cognitive functions and awareness: Secondary | ICD-10-CM | POA: Diagnosis not present

## 2011-10-28 DIAGNOSIS — R262 Difficulty in walking, not elsewhere classified: Secondary | ICD-10-CM | POA: Diagnosis not present

## 2011-10-28 DIAGNOSIS — I619 Nontraumatic intracerebral hemorrhage, unspecified: Secondary | ICD-10-CM | POA: Diagnosis not present

## 2011-10-28 DIAGNOSIS — R269 Unspecified abnormalities of gait and mobility: Secondary | ICD-10-CM | POA: Diagnosis not present

## 2011-10-29 DIAGNOSIS — R269 Unspecified abnormalities of gait and mobility: Secondary | ICD-10-CM | POA: Diagnosis not present

## 2011-10-29 DIAGNOSIS — M6281 Muscle weakness (generalized): Secondary | ICD-10-CM | POA: Diagnosis not present

## 2011-10-29 DIAGNOSIS — I619 Nontraumatic intracerebral hemorrhage, unspecified: Secondary | ICD-10-CM | POA: Diagnosis not present

## 2011-10-29 DIAGNOSIS — R262 Difficulty in walking, not elsewhere classified: Secondary | ICD-10-CM | POA: Diagnosis not present

## 2011-10-29 DIAGNOSIS — R471 Dysarthria and anarthria: Secondary | ICD-10-CM | POA: Diagnosis not present

## 2011-10-29 DIAGNOSIS — R4189 Other symptoms and signs involving cognitive functions and awareness: Secondary | ICD-10-CM | POA: Diagnosis not present

## 2011-10-30 DIAGNOSIS — R4189 Other symptoms and signs involving cognitive functions and awareness: Secondary | ICD-10-CM | POA: Diagnosis not present

## 2011-10-30 DIAGNOSIS — M6281 Muscle weakness (generalized): Secondary | ICD-10-CM | POA: Diagnosis not present

## 2011-10-30 DIAGNOSIS — F341 Dysthymic disorder: Secondary | ICD-10-CM | POA: Diagnosis not present

## 2011-10-30 DIAGNOSIS — F0390 Unspecified dementia without behavioral disturbance: Secondary | ICD-10-CM | POA: Diagnosis not present

## 2011-10-30 DIAGNOSIS — E119 Type 2 diabetes mellitus without complications: Secondary | ICD-10-CM | POA: Diagnosis not present

## 2011-10-30 DIAGNOSIS — I619 Nontraumatic intracerebral hemorrhage, unspecified: Secondary | ICD-10-CM | POA: Diagnosis not present

## 2011-10-30 DIAGNOSIS — I6350 Cerebral infarction due to unspecified occlusion or stenosis of unspecified cerebral artery: Secondary | ICD-10-CM | POA: Diagnosis not present

## 2011-10-30 DIAGNOSIS — R350 Frequency of micturition: Secondary | ICD-10-CM | POA: Diagnosis not present

## 2011-10-30 DIAGNOSIS — R471 Dysarthria and anarthria: Secondary | ICD-10-CM | POA: Diagnosis not present

## 2011-10-30 DIAGNOSIS — R269 Unspecified abnormalities of gait and mobility: Secondary | ICD-10-CM | POA: Diagnosis not present

## 2011-10-30 DIAGNOSIS — R262 Difficulty in walking, not elsewhere classified: Secondary | ICD-10-CM | POA: Diagnosis not present

## 2011-10-31 DIAGNOSIS — N390 Urinary tract infection, site not specified: Secondary | ICD-10-CM | POA: Diagnosis not present

## 2011-11-01 DIAGNOSIS — R471 Dysarthria and anarthria: Secondary | ICD-10-CM | POA: Diagnosis not present

## 2011-11-01 DIAGNOSIS — R262 Difficulty in walking, not elsewhere classified: Secondary | ICD-10-CM | POA: Diagnosis not present

## 2011-11-01 DIAGNOSIS — M6281 Muscle weakness (generalized): Secondary | ICD-10-CM | POA: Diagnosis not present

## 2011-11-01 DIAGNOSIS — R269 Unspecified abnormalities of gait and mobility: Secondary | ICD-10-CM | POA: Diagnosis not present

## 2011-11-01 DIAGNOSIS — I619 Nontraumatic intracerebral hemorrhage, unspecified: Secondary | ICD-10-CM | POA: Diagnosis not present

## 2011-11-01 DIAGNOSIS — R4189 Other symptoms and signs involving cognitive functions and awareness: Secondary | ICD-10-CM | POA: Diagnosis not present

## 2011-11-02 DIAGNOSIS — R4189 Other symptoms and signs involving cognitive functions and awareness: Secondary | ICD-10-CM | POA: Diagnosis not present

## 2011-11-02 DIAGNOSIS — I619 Nontraumatic intracerebral hemorrhage, unspecified: Secondary | ICD-10-CM | POA: Diagnosis not present

## 2011-11-02 DIAGNOSIS — R471 Dysarthria and anarthria: Secondary | ICD-10-CM | POA: Diagnosis not present

## 2011-11-02 DIAGNOSIS — R269 Unspecified abnormalities of gait and mobility: Secondary | ICD-10-CM | POA: Diagnosis not present

## 2011-11-02 DIAGNOSIS — R262 Difficulty in walking, not elsewhere classified: Secondary | ICD-10-CM | POA: Diagnosis not present

## 2011-11-02 DIAGNOSIS — M6281 Muscle weakness (generalized): Secondary | ICD-10-CM | POA: Diagnosis not present

## 2011-11-03 DIAGNOSIS — I619 Nontraumatic intracerebral hemorrhage, unspecified: Secondary | ICD-10-CM | POA: Diagnosis not present

## 2011-11-03 DIAGNOSIS — R471 Dysarthria and anarthria: Secondary | ICD-10-CM | POA: Diagnosis not present

## 2011-11-03 DIAGNOSIS — R269 Unspecified abnormalities of gait and mobility: Secondary | ICD-10-CM | POA: Diagnosis not present

## 2011-11-03 DIAGNOSIS — M6281 Muscle weakness (generalized): Secondary | ICD-10-CM | POA: Diagnosis not present

## 2011-11-03 DIAGNOSIS — R4189 Other symptoms and signs involving cognitive functions and awareness: Secondary | ICD-10-CM | POA: Diagnosis not present

## 2011-11-03 DIAGNOSIS — R262 Difficulty in walking, not elsewhere classified: Secondary | ICD-10-CM | POA: Diagnosis not present

## 2011-11-04 DIAGNOSIS — R4189 Other symptoms and signs involving cognitive functions and awareness: Secondary | ICD-10-CM | POA: Diagnosis not present

## 2011-11-04 DIAGNOSIS — I619 Nontraumatic intracerebral hemorrhage, unspecified: Secondary | ICD-10-CM | POA: Diagnosis not present

## 2011-11-04 DIAGNOSIS — R262 Difficulty in walking, not elsewhere classified: Secondary | ICD-10-CM | POA: Diagnosis not present

## 2011-11-04 DIAGNOSIS — M6281 Muscle weakness (generalized): Secondary | ICD-10-CM | POA: Diagnosis not present

## 2011-11-04 DIAGNOSIS — R471 Dysarthria and anarthria: Secondary | ICD-10-CM | POA: Diagnosis not present

## 2011-11-04 DIAGNOSIS — R269 Unspecified abnormalities of gait and mobility: Secondary | ICD-10-CM | POA: Diagnosis not present

## 2011-11-05 DIAGNOSIS — R471 Dysarthria and anarthria: Secondary | ICD-10-CM | POA: Diagnosis not present

## 2011-11-05 DIAGNOSIS — R269 Unspecified abnormalities of gait and mobility: Secondary | ICD-10-CM | POA: Diagnosis not present

## 2011-11-05 DIAGNOSIS — R4189 Other symptoms and signs involving cognitive functions and awareness: Secondary | ICD-10-CM | POA: Diagnosis not present

## 2011-11-05 DIAGNOSIS — I619 Nontraumatic intracerebral hemorrhage, unspecified: Secondary | ICD-10-CM | POA: Diagnosis not present

## 2011-11-05 DIAGNOSIS — R262 Difficulty in walking, not elsewhere classified: Secondary | ICD-10-CM | POA: Diagnosis not present

## 2011-11-05 DIAGNOSIS — M6281 Muscle weakness (generalized): Secondary | ICD-10-CM | POA: Diagnosis not present

## 2011-11-08 DIAGNOSIS — R4189 Other symptoms and signs involving cognitive functions and awareness: Secondary | ICD-10-CM | POA: Diagnosis not present

## 2011-11-08 DIAGNOSIS — M6281 Muscle weakness (generalized): Secondary | ICD-10-CM | POA: Diagnosis not present

## 2011-11-08 DIAGNOSIS — R269 Unspecified abnormalities of gait and mobility: Secondary | ICD-10-CM | POA: Diagnosis not present

## 2011-11-08 DIAGNOSIS — R262 Difficulty in walking, not elsewhere classified: Secondary | ICD-10-CM | POA: Diagnosis not present

## 2011-11-08 DIAGNOSIS — I619 Nontraumatic intracerebral hemorrhage, unspecified: Secondary | ICD-10-CM | POA: Diagnosis not present

## 2011-11-08 DIAGNOSIS — R471 Dysarthria and anarthria: Secondary | ICD-10-CM | POA: Diagnosis not present

## 2011-11-09 DIAGNOSIS — R262 Difficulty in walking, not elsewhere classified: Secondary | ICD-10-CM | POA: Diagnosis not present

## 2011-11-09 DIAGNOSIS — R471 Dysarthria and anarthria: Secondary | ICD-10-CM | POA: Diagnosis not present

## 2011-11-09 DIAGNOSIS — I619 Nontraumatic intracerebral hemorrhage, unspecified: Secondary | ICD-10-CM | POA: Diagnosis not present

## 2011-11-09 DIAGNOSIS — R4189 Other symptoms and signs involving cognitive functions and awareness: Secondary | ICD-10-CM | POA: Diagnosis not present

## 2011-11-09 DIAGNOSIS — M6281 Muscle weakness (generalized): Secondary | ICD-10-CM | POA: Diagnosis not present

## 2011-11-09 DIAGNOSIS — R269 Unspecified abnormalities of gait and mobility: Secondary | ICD-10-CM | POA: Diagnosis not present

## 2011-11-10 DIAGNOSIS — M6281 Muscle weakness (generalized): Secondary | ICD-10-CM | POA: Diagnosis not present

## 2011-11-10 DIAGNOSIS — I619 Nontraumatic intracerebral hemorrhage, unspecified: Secondary | ICD-10-CM | POA: Diagnosis not present

## 2011-11-10 DIAGNOSIS — R269 Unspecified abnormalities of gait and mobility: Secondary | ICD-10-CM | POA: Diagnosis not present

## 2011-11-10 DIAGNOSIS — R4189 Other symptoms and signs involving cognitive functions and awareness: Secondary | ICD-10-CM | POA: Diagnosis not present

## 2011-11-10 DIAGNOSIS — R262 Difficulty in walking, not elsewhere classified: Secondary | ICD-10-CM | POA: Diagnosis not present

## 2011-11-10 DIAGNOSIS — R471 Dysarthria and anarthria: Secondary | ICD-10-CM | POA: Diagnosis not present

## 2011-11-11 DIAGNOSIS — I619 Nontraumatic intracerebral hemorrhage, unspecified: Secondary | ICD-10-CM | POA: Diagnosis not present

## 2011-11-11 DIAGNOSIS — R471 Dysarthria and anarthria: Secondary | ICD-10-CM | POA: Diagnosis not present

## 2011-11-11 DIAGNOSIS — R269 Unspecified abnormalities of gait and mobility: Secondary | ICD-10-CM | POA: Diagnosis not present

## 2011-11-11 DIAGNOSIS — R4189 Other symptoms and signs involving cognitive functions and awareness: Secondary | ICD-10-CM | POA: Diagnosis not present

## 2011-11-11 DIAGNOSIS — M6281 Muscle weakness (generalized): Secondary | ICD-10-CM | POA: Diagnosis not present

## 2011-11-11 DIAGNOSIS — R262 Difficulty in walking, not elsewhere classified: Secondary | ICD-10-CM | POA: Diagnosis not present

## 2011-11-12 DIAGNOSIS — R269 Unspecified abnormalities of gait and mobility: Secondary | ICD-10-CM | POA: Diagnosis not present

## 2011-11-12 DIAGNOSIS — M6281 Muscle weakness (generalized): Secondary | ICD-10-CM | POA: Diagnosis not present

## 2011-11-12 DIAGNOSIS — I619 Nontraumatic intracerebral hemorrhage, unspecified: Secondary | ICD-10-CM | POA: Diagnosis not present

## 2011-11-12 DIAGNOSIS — R4189 Other symptoms and signs involving cognitive functions and awareness: Secondary | ICD-10-CM | POA: Diagnosis not present

## 2011-11-12 DIAGNOSIS — R262 Difficulty in walking, not elsewhere classified: Secondary | ICD-10-CM | POA: Diagnosis not present

## 2011-11-12 DIAGNOSIS — R471 Dysarthria and anarthria: Secondary | ICD-10-CM | POA: Diagnosis not present

## 2011-11-13 DIAGNOSIS — R269 Unspecified abnormalities of gait and mobility: Secondary | ICD-10-CM | POA: Diagnosis not present

## 2011-11-13 DIAGNOSIS — R4189 Other symptoms and signs involving cognitive functions and awareness: Secondary | ICD-10-CM | POA: Diagnosis not present

## 2011-11-13 DIAGNOSIS — M6281 Muscle weakness (generalized): Secondary | ICD-10-CM | POA: Diagnosis not present

## 2011-11-13 DIAGNOSIS — R262 Difficulty in walking, not elsewhere classified: Secondary | ICD-10-CM | POA: Diagnosis not present

## 2011-11-13 DIAGNOSIS — I619 Nontraumatic intracerebral hemorrhage, unspecified: Secondary | ICD-10-CM | POA: Diagnosis not present

## 2011-11-13 DIAGNOSIS — R471 Dysarthria and anarthria: Secondary | ICD-10-CM | POA: Diagnosis not present

## 2011-12-13 DIAGNOSIS — I619 Nontraumatic intracerebral hemorrhage, unspecified: Secondary | ICD-10-CM | POA: Diagnosis not present

## 2011-12-13 DIAGNOSIS — R269 Unspecified abnormalities of gait and mobility: Secondary | ICD-10-CM | POA: Diagnosis not present

## 2011-12-13 DIAGNOSIS — R4189 Other symptoms and signs involving cognitive functions and awareness: Secondary | ICD-10-CM | POA: Diagnosis not present

## 2011-12-13 DIAGNOSIS — M6281 Muscle weakness (generalized): Secondary | ICD-10-CM | POA: Diagnosis not present

## 2011-12-13 DIAGNOSIS — R262 Difficulty in walking, not elsewhere classified: Secondary | ICD-10-CM | POA: Diagnosis not present

## 2011-12-13 DIAGNOSIS — R1312 Dysphagia, oropharyngeal phase: Secondary | ICD-10-CM | POA: Diagnosis not present

## 2011-12-14 DIAGNOSIS — N390 Urinary tract infection, site not specified: Secondary | ICD-10-CM | POA: Diagnosis not present

## 2011-12-14 DIAGNOSIS — M6281 Muscle weakness (generalized): Secondary | ICD-10-CM | POA: Diagnosis not present

## 2011-12-14 DIAGNOSIS — R269 Unspecified abnormalities of gait and mobility: Secondary | ICD-10-CM | POA: Diagnosis not present

## 2011-12-14 DIAGNOSIS — R1312 Dysphagia, oropharyngeal phase: Secondary | ICD-10-CM | POA: Diagnosis not present

## 2011-12-14 DIAGNOSIS — I619 Nontraumatic intracerebral hemorrhage, unspecified: Secondary | ICD-10-CM | POA: Diagnosis not present

## 2011-12-14 DIAGNOSIS — R262 Difficulty in walking, not elsewhere classified: Secondary | ICD-10-CM | POA: Diagnosis not present

## 2011-12-14 DIAGNOSIS — R4189 Other symptoms and signs involving cognitive functions and awareness: Secondary | ICD-10-CM | POA: Diagnosis not present

## 2011-12-15 DIAGNOSIS — N390 Urinary tract infection, site not specified: Secondary | ICD-10-CM | POA: Diagnosis not present

## 2011-12-15 DIAGNOSIS — R4189 Other symptoms and signs involving cognitive functions and awareness: Secondary | ICD-10-CM | POA: Diagnosis not present

## 2011-12-15 DIAGNOSIS — R1312 Dysphagia, oropharyngeal phase: Secondary | ICD-10-CM | POA: Diagnosis not present

## 2011-12-15 DIAGNOSIS — R262 Difficulty in walking, not elsewhere classified: Secondary | ICD-10-CM | POA: Diagnosis not present

## 2011-12-15 DIAGNOSIS — M6281 Muscle weakness (generalized): Secondary | ICD-10-CM | POA: Diagnosis not present

## 2011-12-15 DIAGNOSIS — R269 Unspecified abnormalities of gait and mobility: Secondary | ICD-10-CM | POA: Diagnosis not present

## 2011-12-15 DIAGNOSIS — I619 Nontraumatic intracerebral hemorrhage, unspecified: Secondary | ICD-10-CM | POA: Diagnosis not present

## 2011-12-16 DIAGNOSIS — R262 Difficulty in walking, not elsewhere classified: Secondary | ICD-10-CM | POA: Diagnosis not present

## 2011-12-16 DIAGNOSIS — I619 Nontraumatic intracerebral hemorrhage, unspecified: Secondary | ICD-10-CM | POA: Diagnosis not present

## 2011-12-16 DIAGNOSIS — R4189 Other symptoms and signs involving cognitive functions and awareness: Secondary | ICD-10-CM | POA: Diagnosis not present

## 2011-12-16 DIAGNOSIS — M6281 Muscle weakness (generalized): Secondary | ICD-10-CM | POA: Diagnosis not present

## 2011-12-16 DIAGNOSIS — R1312 Dysphagia, oropharyngeal phase: Secondary | ICD-10-CM | POA: Diagnosis not present

## 2011-12-16 DIAGNOSIS — R269 Unspecified abnormalities of gait and mobility: Secondary | ICD-10-CM | POA: Diagnosis not present

## 2011-12-17 DIAGNOSIS — R1312 Dysphagia, oropharyngeal phase: Secondary | ICD-10-CM | POA: Diagnosis not present

## 2011-12-17 DIAGNOSIS — M6281 Muscle weakness (generalized): Secondary | ICD-10-CM | POA: Diagnosis not present

## 2011-12-17 DIAGNOSIS — I619 Nontraumatic intracerebral hemorrhage, unspecified: Secondary | ICD-10-CM | POA: Diagnosis not present

## 2011-12-17 DIAGNOSIS — R4189 Other symptoms and signs involving cognitive functions and awareness: Secondary | ICD-10-CM | POA: Diagnosis not present

## 2011-12-17 DIAGNOSIS — R269 Unspecified abnormalities of gait and mobility: Secondary | ICD-10-CM | POA: Diagnosis not present

## 2011-12-17 DIAGNOSIS — R262 Difficulty in walking, not elsewhere classified: Secondary | ICD-10-CM | POA: Diagnosis not present

## 2011-12-20 DIAGNOSIS — R262 Difficulty in walking, not elsewhere classified: Secondary | ICD-10-CM | POA: Diagnosis not present

## 2011-12-20 DIAGNOSIS — R269 Unspecified abnormalities of gait and mobility: Secondary | ICD-10-CM | POA: Diagnosis not present

## 2011-12-20 DIAGNOSIS — R4189 Other symptoms and signs involving cognitive functions and awareness: Secondary | ICD-10-CM | POA: Diagnosis not present

## 2011-12-20 DIAGNOSIS — I619 Nontraumatic intracerebral hemorrhage, unspecified: Secondary | ICD-10-CM | POA: Diagnosis not present

## 2011-12-20 DIAGNOSIS — R1312 Dysphagia, oropharyngeal phase: Secondary | ICD-10-CM | POA: Diagnosis not present

## 2011-12-20 DIAGNOSIS — M6281 Muscle weakness (generalized): Secondary | ICD-10-CM | POA: Diagnosis not present

## 2011-12-21 DIAGNOSIS — R269 Unspecified abnormalities of gait and mobility: Secondary | ICD-10-CM | POA: Diagnosis not present

## 2011-12-21 DIAGNOSIS — M6281 Muscle weakness (generalized): Secondary | ICD-10-CM | POA: Diagnosis not present

## 2011-12-21 DIAGNOSIS — R4189 Other symptoms and signs involving cognitive functions and awareness: Secondary | ICD-10-CM | POA: Diagnosis not present

## 2011-12-21 DIAGNOSIS — I619 Nontraumatic intracerebral hemorrhage, unspecified: Secondary | ICD-10-CM | POA: Diagnosis not present

## 2011-12-21 DIAGNOSIS — R262 Difficulty in walking, not elsewhere classified: Secondary | ICD-10-CM | POA: Diagnosis not present

## 2011-12-21 DIAGNOSIS — R1312 Dysphagia, oropharyngeal phase: Secondary | ICD-10-CM | POA: Diagnosis not present

## 2011-12-22 DIAGNOSIS — R1312 Dysphagia, oropharyngeal phase: Secondary | ICD-10-CM | POA: Diagnosis not present

## 2011-12-22 DIAGNOSIS — M6281 Muscle weakness (generalized): Secondary | ICD-10-CM | POA: Diagnosis not present

## 2011-12-22 DIAGNOSIS — R262 Difficulty in walking, not elsewhere classified: Secondary | ICD-10-CM | POA: Diagnosis not present

## 2011-12-22 DIAGNOSIS — R269 Unspecified abnormalities of gait and mobility: Secondary | ICD-10-CM | POA: Diagnosis not present

## 2011-12-22 DIAGNOSIS — I619 Nontraumatic intracerebral hemorrhage, unspecified: Secondary | ICD-10-CM | POA: Diagnosis not present

## 2011-12-22 DIAGNOSIS — R4189 Other symptoms and signs involving cognitive functions and awareness: Secondary | ICD-10-CM | POA: Diagnosis not present

## 2011-12-23 DIAGNOSIS — R4189 Other symptoms and signs involving cognitive functions and awareness: Secondary | ICD-10-CM | POA: Diagnosis not present

## 2011-12-23 DIAGNOSIS — R269 Unspecified abnormalities of gait and mobility: Secondary | ICD-10-CM | POA: Diagnosis not present

## 2011-12-23 DIAGNOSIS — R262 Difficulty in walking, not elsewhere classified: Secondary | ICD-10-CM | POA: Diagnosis not present

## 2011-12-23 DIAGNOSIS — R1312 Dysphagia, oropharyngeal phase: Secondary | ICD-10-CM | POA: Diagnosis not present

## 2011-12-23 DIAGNOSIS — I619 Nontraumatic intracerebral hemorrhage, unspecified: Secondary | ICD-10-CM | POA: Diagnosis not present

## 2011-12-23 DIAGNOSIS — M6281 Muscle weakness (generalized): Secondary | ICD-10-CM | POA: Diagnosis not present

## 2011-12-24 DIAGNOSIS — M6281 Muscle weakness (generalized): Secondary | ICD-10-CM | POA: Diagnosis not present

## 2011-12-24 DIAGNOSIS — R4189 Other symptoms and signs involving cognitive functions and awareness: Secondary | ICD-10-CM | POA: Diagnosis not present

## 2011-12-24 DIAGNOSIS — R1312 Dysphagia, oropharyngeal phase: Secondary | ICD-10-CM | POA: Diagnosis not present

## 2011-12-24 DIAGNOSIS — R262 Difficulty in walking, not elsewhere classified: Secondary | ICD-10-CM | POA: Diagnosis not present

## 2011-12-24 DIAGNOSIS — R269 Unspecified abnormalities of gait and mobility: Secondary | ICD-10-CM | POA: Diagnosis not present

## 2011-12-24 DIAGNOSIS — I619 Nontraumatic intracerebral hemorrhage, unspecified: Secondary | ICD-10-CM | POA: Diagnosis not present

## 2011-12-27 DIAGNOSIS — I619 Nontraumatic intracerebral hemorrhage, unspecified: Secondary | ICD-10-CM | POA: Diagnosis not present

## 2011-12-27 DIAGNOSIS — R269 Unspecified abnormalities of gait and mobility: Secondary | ICD-10-CM | POA: Diagnosis not present

## 2011-12-27 DIAGNOSIS — M6281 Muscle weakness (generalized): Secondary | ICD-10-CM | POA: Diagnosis not present

## 2011-12-27 DIAGNOSIS — R4189 Other symptoms and signs involving cognitive functions and awareness: Secondary | ICD-10-CM | POA: Diagnosis not present

## 2011-12-27 DIAGNOSIS — R1312 Dysphagia, oropharyngeal phase: Secondary | ICD-10-CM | POA: Diagnosis not present

## 2011-12-27 DIAGNOSIS — R262 Difficulty in walking, not elsewhere classified: Secondary | ICD-10-CM | POA: Diagnosis not present

## 2011-12-28 DIAGNOSIS — R1312 Dysphagia, oropharyngeal phase: Secondary | ICD-10-CM | POA: Diagnosis not present

## 2011-12-28 DIAGNOSIS — R269 Unspecified abnormalities of gait and mobility: Secondary | ICD-10-CM | POA: Diagnosis not present

## 2011-12-28 DIAGNOSIS — I619 Nontraumatic intracerebral hemorrhage, unspecified: Secondary | ICD-10-CM | POA: Diagnosis not present

## 2011-12-28 DIAGNOSIS — M6281 Muscle weakness (generalized): Secondary | ICD-10-CM | POA: Diagnosis not present

## 2011-12-28 DIAGNOSIS — R4189 Other symptoms and signs involving cognitive functions and awareness: Secondary | ICD-10-CM | POA: Diagnosis not present

## 2011-12-28 DIAGNOSIS — R262 Difficulty in walking, not elsewhere classified: Secondary | ICD-10-CM | POA: Diagnosis not present

## 2011-12-29 DIAGNOSIS — R262 Difficulty in walking, not elsewhere classified: Secondary | ICD-10-CM | POA: Diagnosis not present

## 2011-12-29 DIAGNOSIS — R1312 Dysphagia, oropharyngeal phase: Secondary | ICD-10-CM | POA: Diagnosis not present

## 2011-12-29 DIAGNOSIS — I619 Nontraumatic intracerebral hemorrhage, unspecified: Secondary | ICD-10-CM | POA: Diagnosis not present

## 2011-12-29 DIAGNOSIS — M6281 Muscle weakness (generalized): Secondary | ICD-10-CM | POA: Diagnosis not present

## 2011-12-29 DIAGNOSIS — R4189 Other symptoms and signs involving cognitive functions and awareness: Secondary | ICD-10-CM | POA: Diagnosis not present

## 2011-12-29 DIAGNOSIS — R269 Unspecified abnormalities of gait and mobility: Secondary | ICD-10-CM | POA: Diagnosis not present

## 2011-12-30 DIAGNOSIS — R1312 Dysphagia, oropharyngeal phase: Secondary | ICD-10-CM | POA: Diagnosis not present

## 2011-12-30 DIAGNOSIS — R262 Difficulty in walking, not elsewhere classified: Secondary | ICD-10-CM | POA: Diagnosis not present

## 2011-12-30 DIAGNOSIS — I619 Nontraumatic intracerebral hemorrhage, unspecified: Secondary | ICD-10-CM | POA: Diagnosis not present

## 2011-12-30 DIAGNOSIS — M6281 Muscle weakness (generalized): Secondary | ICD-10-CM | POA: Diagnosis not present

## 2011-12-30 DIAGNOSIS — R4189 Other symptoms and signs involving cognitive functions and awareness: Secondary | ICD-10-CM | POA: Diagnosis not present

## 2011-12-30 DIAGNOSIS — R269 Unspecified abnormalities of gait and mobility: Secondary | ICD-10-CM | POA: Diagnosis not present

## 2011-12-31 DIAGNOSIS — M6281 Muscle weakness (generalized): Secondary | ICD-10-CM | POA: Diagnosis not present

## 2011-12-31 DIAGNOSIS — I619 Nontraumatic intracerebral hemorrhage, unspecified: Secondary | ICD-10-CM | POA: Diagnosis not present

## 2011-12-31 DIAGNOSIS — R269 Unspecified abnormalities of gait and mobility: Secondary | ICD-10-CM | POA: Diagnosis not present

## 2011-12-31 DIAGNOSIS — R4189 Other symptoms and signs involving cognitive functions and awareness: Secondary | ICD-10-CM | POA: Diagnosis not present

## 2011-12-31 DIAGNOSIS — R262 Difficulty in walking, not elsewhere classified: Secondary | ICD-10-CM | POA: Diagnosis not present

## 2011-12-31 DIAGNOSIS — R1312 Dysphagia, oropharyngeal phase: Secondary | ICD-10-CM | POA: Diagnosis not present

## 2012-01-03 DIAGNOSIS — R1312 Dysphagia, oropharyngeal phase: Secondary | ICD-10-CM | POA: Diagnosis not present

## 2012-01-03 DIAGNOSIS — R262 Difficulty in walking, not elsewhere classified: Secondary | ICD-10-CM | POA: Diagnosis not present

## 2012-01-03 DIAGNOSIS — R4189 Other symptoms and signs involving cognitive functions and awareness: Secondary | ICD-10-CM | POA: Diagnosis not present

## 2012-01-03 DIAGNOSIS — I619 Nontraumatic intracerebral hemorrhage, unspecified: Secondary | ICD-10-CM | POA: Diagnosis not present

## 2012-01-03 DIAGNOSIS — M6281 Muscle weakness (generalized): Secondary | ICD-10-CM | POA: Diagnosis not present

## 2012-01-03 DIAGNOSIS — R269 Unspecified abnormalities of gait and mobility: Secondary | ICD-10-CM | POA: Diagnosis not present

## 2012-01-04 DIAGNOSIS — R269 Unspecified abnormalities of gait and mobility: Secondary | ICD-10-CM | POA: Diagnosis not present

## 2012-01-04 DIAGNOSIS — M6281 Muscle weakness (generalized): Secondary | ICD-10-CM | POA: Diagnosis not present

## 2012-01-04 DIAGNOSIS — I619 Nontraumatic intracerebral hemorrhage, unspecified: Secondary | ICD-10-CM | POA: Diagnosis not present

## 2012-01-04 DIAGNOSIS — M25569 Pain in unspecified knee: Secondary | ICD-10-CM | POA: Diagnosis not present

## 2012-01-04 DIAGNOSIS — R262 Difficulty in walking, not elsewhere classified: Secondary | ICD-10-CM | POA: Diagnosis not present

## 2012-01-04 DIAGNOSIS — R1312 Dysphagia, oropharyngeal phase: Secondary | ICD-10-CM | POA: Diagnosis not present

## 2012-01-04 DIAGNOSIS — R4189 Other symptoms and signs involving cognitive functions and awareness: Secondary | ICD-10-CM | POA: Diagnosis not present

## 2012-01-05 DIAGNOSIS — I619 Nontraumatic intracerebral hemorrhage, unspecified: Secondary | ICD-10-CM | POA: Diagnosis not present

## 2012-01-05 DIAGNOSIS — M546 Pain in thoracic spine: Secondary | ICD-10-CM | POA: Diagnosis not present

## 2012-01-05 DIAGNOSIS — R1312 Dysphagia, oropharyngeal phase: Secondary | ICD-10-CM | POA: Diagnosis not present

## 2012-01-05 DIAGNOSIS — M6281 Muscle weakness (generalized): Secondary | ICD-10-CM | POA: Diagnosis not present

## 2012-01-05 DIAGNOSIS — R269 Unspecified abnormalities of gait and mobility: Secondary | ICD-10-CM | POA: Diagnosis not present

## 2012-01-05 DIAGNOSIS — R4189 Other symptoms and signs involving cognitive functions and awareness: Secondary | ICD-10-CM | POA: Diagnosis not present

## 2012-01-05 DIAGNOSIS — R262 Difficulty in walking, not elsewhere classified: Secondary | ICD-10-CM | POA: Diagnosis not present

## 2012-01-06 ENCOUNTER — Other Ambulatory Visit (HOSPITAL_COMMUNITY): Payer: Self-pay | Admitting: Internal Medicine

## 2012-01-06 DIAGNOSIS — R262 Difficulty in walking, not elsewhere classified: Secondary | ICD-10-CM | POA: Diagnosis not present

## 2012-01-06 DIAGNOSIS — I619 Nontraumatic intracerebral hemorrhage, unspecified: Secondary | ICD-10-CM | POA: Diagnosis not present

## 2012-01-06 DIAGNOSIS — M6281 Muscle weakness (generalized): Secondary | ICD-10-CM | POA: Diagnosis not present

## 2012-01-06 DIAGNOSIS — R269 Unspecified abnormalities of gait and mobility: Secondary | ICD-10-CM | POA: Diagnosis not present

## 2012-01-06 DIAGNOSIS — R4189 Other symptoms and signs involving cognitive functions and awareness: Secondary | ICD-10-CM | POA: Diagnosis not present

## 2012-01-06 DIAGNOSIS — R519 Headache, unspecified: Secondary | ICD-10-CM

## 2012-01-06 DIAGNOSIS — R1312 Dysphagia, oropharyngeal phase: Secondary | ICD-10-CM | POA: Diagnosis not present

## 2012-01-07 ENCOUNTER — Ambulatory Visit (HOSPITAL_COMMUNITY)
Admission: RE | Admit: 2012-01-07 | Discharge: 2012-01-07 | Disposition: A | Discharge status: Home / Self Care | Payer: Medicare Other | Source: Ambulatory Visit | Attending: Internal Medicine | Admitting: Internal Medicine

## 2012-01-07 DIAGNOSIS — R269 Unspecified abnormalities of gait and mobility: Secondary | ICD-10-CM | POA: Diagnosis not present

## 2012-01-07 DIAGNOSIS — M6281 Muscle weakness (generalized): Secondary | ICD-10-CM | POA: Diagnosis not present

## 2012-01-07 DIAGNOSIS — R262 Difficulty in walking, not elsewhere classified: Secondary | ICD-10-CM | POA: Diagnosis not present

## 2012-01-07 DIAGNOSIS — Z8673 Personal history of transient ischemic attack (TIA), and cerebral infarction without residual deficits: Secondary | ICD-10-CM | POA: Diagnosis not present

## 2012-01-07 DIAGNOSIS — Z043 Encounter for examination and observation following other accident: Secondary | ICD-10-CM | POA: Diagnosis not present

## 2012-01-07 DIAGNOSIS — I619 Nontraumatic intracerebral hemorrhage, unspecified: Secondary | ICD-10-CM | POA: Diagnosis not present

## 2012-01-07 DIAGNOSIS — R4189 Other symptoms and signs involving cognitive functions and awareness: Secondary | ICD-10-CM | POA: Diagnosis not present

## 2012-01-07 DIAGNOSIS — R51 Headache: Secondary | ICD-10-CM | POA: Diagnosis not present

## 2012-01-07 DIAGNOSIS — R1312 Dysphagia, oropharyngeal phase: Secondary | ICD-10-CM | POA: Diagnosis not present

## 2012-01-31 DIAGNOSIS — Z961 Presence of intraocular lens: Secondary | ICD-10-CM | POA: Diagnosis not present

## 2012-01-31 DIAGNOSIS — H532 Diplopia: Secondary | ICD-10-CM | POA: Diagnosis not present

## 2012-01-31 DIAGNOSIS — H04129 Dry eye syndrome of unspecified lacrimal gland: Secondary | ICD-10-CM | POA: Diagnosis not present

## 2012-02-05 DIAGNOSIS — N390 Urinary tract infection, site not specified: Secondary | ICD-10-CM | POA: Diagnosis not present

## 2012-03-01 DIAGNOSIS — R300 Dysuria: Secondary | ICD-10-CM | POA: Diagnosis not present

## 2012-03-01 DIAGNOSIS — R4182 Altered mental status, unspecified: Secondary | ICD-10-CM | POA: Diagnosis not present

## 2012-03-02 DIAGNOSIS — N390 Urinary tract infection, site not specified: Secondary | ICD-10-CM | POA: Diagnosis not present

## 2012-03-11 DIAGNOSIS — R21 Rash and other nonspecific skin eruption: Secondary | ICD-10-CM | POA: Diagnosis not present

## 2012-03-11 DIAGNOSIS — E039 Hypothyroidism, unspecified: Secondary | ICD-10-CM | POA: Diagnosis not present

## 2012-03-11 DIAGNOSIS — E119 Type 2 diabetes mellitus without complications: Secondary | ICD-10-CM | POA: Diagnosis not present

## 2012-03-11 DIAGNOSIS — N390 Urinary tract infection, site not specified: Secondary | ICD-10-CM | POA: Diagnosis not present

## 2012-03-16 DIAGNOSIS — D649 Anemia, unspecified: Secondary | ICD-10-CM | POA: Diagnosis not present

## 2012-03-16 DIAGNOSIS — E039 Hypothyroidism, unspecified: Secondary | ICD-10-CM | POA: Diagnosis not present

## 2012-03-16 DIAGNOSIS — E119 Type 2 diabetes mellitus without complications: Secondary | ICD-10-CM | POA: Diagnosis not present

## 2012-03-16 DIAGNOSIS — I10 Essential (primary) hypertension: Secondary | ICD-10-CM | POA: Diagnosis not present

## 2012-03-16 DIAGNOSIS — E7800 Pure hypercholesterolemia, unspecified: Secondary | ICD-10-CM | POA: Diagnosis not present

## 2012-03-28 DIAGNOSIS — I739 Peripheral vascular disease, unspecified: Secondary | ICD-10-CM | POA: Diagnosis not present

## 2012-04-10 DIAGNOSIS — E785 Hyperlipidemia, unspecified: Secondary | ICD-10-CM | POA: Diagnosis not present

## 2012-04-10 DIAGNOSIS — E039 Hypothyroidism, unspecified: Secondary | ICD-10-CM | POA: Diagnosis not present

## 2012-04-10 DIAGNOSIS — W19XXXA Unspecified fall, initial encounter: Secondary | ICD-10-CM | POA: Diagnosis not present

## 2012-04-10 DIAGNOSIS — T148XXA Other injury of unspecified body region, initial encounter: Secondary | ICD-10-CM | POA: Diagnosis not present

## 2012-04-11 DIAGNOSIS — I619 Nontraumatic intracerebral hemorrhage, unspecified: Secondary | ICD-10-CM | POA: Diagnosis not present

## 2012-04-12 DIAGNOSIS — R1312 Dysphagia, oropharyngeal phase: Secondary | ICD-10-CM | POA: Diagnosis not present

## 2012-04-12 DIAGNOSIS — R269 Unspecified abnormalities of gait and mobility: Secondary | ICD-10-CM | POA: Diagnosis not present

## 2012-04-12 DIAGNOSIS — M25559 Pain in unspecified hip: Secondary | ICD-10-CM | POA: Diagnosis not present

## 2012-04-12 DIAGNOSIS — R262 Difficulty in walking, not elsewhere classified: Secondary | ICD-10-CM | POA: Diagnosis not present

## 2012-04-12 DIAGNOSIS — R4189 Other symptoms and signs involving cognitive functions and awareness: Secondary | ICD-10-CM | POA: Diagnosis not present

## 2012-04-12 DIAGNOSIS — R471 Dysarthria and anarthria: Secondary | ICD-10-CM | POA: Diagnosis not present

## 2012-04-12 DIAGNOSIS — I619 Nontraumatic intracerebral hemorrhage, unspecified: Secondary | ICD-10-CM | POA: Diagnosis not present

## 2012-04-12 DIAGNOSIS — M6281 Muscle weakness (generalized): Secondary | ICD-10-CM | POA: Diagnosis not present

## 2012-04-13 DIAGNOSIS — R1312 Dysphagia, oropharyngeal phase: Secondary | ICD-10-CM | POA: Diagnosis not present

## 2012-04-13 DIAGNOSIS — R471 Dysarthria and anarthria: Secondary | ICD-10-CM | POA: Diagnosis not present

## 2012-04-13 DIAGNOSIS — R262 Difficulty in walking, not elsewhere classified: Secondary | ICD-10-CM | POA: Diagnosis not present

## 2012-04-13 DIAGNOSIS — I619 Nontraumatic intracerebral hemorrhage, unspecified: Secondary | ICD-10-CM | POA: Diagnosis not present

## 2012-04-13 DIAGNOSIS — R269 Unspecified abnormalities of gait and mobility: Secondary | ICD-10-CM | POA: Diagnosis not present

## 2012-04-13 DIAGNOSIS — R4182 Altered mental status, unspecified: Secondary | ICD-10-CM | POA: Diagnosis not present

## 2012-04-13 DIAGNOSIS — M6281 Muscle weakness (generalized): Secondary | ICD-10-CM | POA: Diagnosis not present

## 2012-04-14 DIAGNOSIS — R269 Unspecified abnormalities of gait and mobility: Secondary | ICD-10-CM | POA: Diagnosis not present

## 2012-04-14 DIAGNOSIS — R471 Dysarthria and anarthria: Secondary | ICD-10-CM | POA: Diagnosis not present

## 2012-04-14 DIAGNOSIS — R1312 Dysphagia, oropharyngeal phase: Secondary | ICD-10-CM | POA: Diagnosis not present

## 2012-04-14 DIAGNOSIS — M6281 Muscle weakness (generalized): Secondary | ICD-10-CM | POA: Diagnosis not present

## 2012-04-14 DIAGNOSIS — R4189 Other symptoms and signs involving cognitive functions and awareness: Secondary | ICD-10-CM | POA: Diagnosis not present

## 2012-04-14 DIAGNOSIS — R262 Difficulty in walking, not elsewhere classified: Secondary | ICD-10-CM | POA: Diagnosis not present

## 2012-04-14 DIAGNOSIS — I619 Nontraumatic intracerebral hemorrhage, unspecified: Secondary | ICD-10-CM | POA: Diagnosis not present

## 2012-04-16 DIAGNOSIS — R4182 Altered mental status, unspecified: Secondary | ICD-10-CM | POA: Diagnosis not present

## 2012-04-17 DIAGNOSIS — I619 Nontraumatic intracerebral hemorrhage, unspecified: Secondary | ICD-10-CM | POA: Diagnosis not present

## 2012-04-17 DIAGNOSIS — R269 Unspecified abnormalities of gait and mobility: Secondary | ICD-10-CM | POA: Diagnosis not present

## 2012-04-17 DIAGNOSIS — R1312 Dysphagia, oropharyngeal phase: Secondary | ICD-10-CM | POA: Diagnosis not present

## 2012-04-17 DIAGNOSIS — R471 Dysarthria and anarthria: Secondary | ICD-10-CM | POA: Diagnosis not present

## 2012-04-17 DIAGNOSIS — R262 Difficulty in walking, not elsewhere classified: Secondary | ICD-10-CM | POA: Diagnosis not present

## 2012-04-17 DIAGNOSIS — M6281 Muscle weakness (generalized): Secondary | ICD-10-CM | POA: Diagnosis not present

## 2012-04-18 DIAGNOSIS — R471 Dysarthria and anarthria: Secondary | ICD-10-CM | POA: Diagnosis not present

## 2012-04-18 DIAGNOSIS — I619 Nontraumatic intracerebral hemorrhage, unspecified: Secondary | ICD-10-CM | POA: Diagnosis not present

## 2012-04-18 DIAGNOSIS — M6281 Muscle weakness (generalized): Secondary | ICD-10-CM | POA: Diagnosis not present

## 2012-04-18 DIAGNOSIS — R269 Unspecified abnormalities of gait and mobility: Secondary | ICD-10-CM | POA: Diagnosis not present

## 2012-04-18 DIAGNOSIS — R262 Difficulty in walking, not elsewhere classified: Secondary | ICD-10-CM | POA: Diagnosis not present

## 2012-04-18 DIAGNOSIS — R1312 Dysphagia, oropharyngeal phase: Secondary | ICD-10-CM | POA: Diagnosis not present

## 2012-04-19 DIAGNOSIS — R262 Difficulty in walking, not elsewhere classified: Secondary | ICD-10-CM | POA: Diagnosis not present

## 2012-04-19 DIAGNOSIS — R269 Unspecified abnormalities of gait and mobility: Secondary | ICD-10-CM | POA: Diagnosis not present

## 2012-04-19 DIAGNOSIS — I619 Nontraumatic intracerebral hemorrhage, unspecified: Secondary | ICD-10-CM | POA: Diagnosis not present

## 2012-04-19 DIAGNOSIS — M6281 Muscle weakness (generalized): Secondary | ICD-10-CM | POA: Diagnosis not present

## 2012-04-19 DIAGNOSIS — R1312 Dysphagia, oropharyngeal phase: Secondary | ICD-10-CM | POA: Diagnosis not present

## 2012-04-19 DIAGNOSIS — R471 Dysarthria and anarthria: Secondary | ICD-10-CM | POA: Diagnosis not present

## 2012-04-20 DIAGNOSIS — I619 Nontraumatic intracerebral hemorrhage, unspecified: Secondary | ICD-10-CM | POA: Diagnosis not present

## 2012-04-20 DIAGNOSIS — R471 Dysarthria and anarthria: Secondary | ICD-10-CM | POA: Diagnosis not present

## 2012-04-20 DIAGNOSIS — R1312 Dysphagia, oropharyngeal phase: Secondary | ICD-10-CM | POA: Diagnosis not present

## 2012-04-20 DIAGNOSIS — R269 Unspecified abnormalities of gait and mobility: Secondary | ICD-10-CM | POA: Diagnosis not present

## 2012-04-20 DIAGNOSIS — M6281 Muscle weakness (generalized): Secondary | ICD-10-CM | POA: Diagnosis not present

## 2012-04-20 DIAGNOSIS — R262 Difficulty in walking, not elsewhere classified: Secondary | ICD-10-CM | POA: Diagnosis not present

## 2012-04-20 DIAGNOSIS — N390 Urinary tract infection, site not specified: Secondary | ICD-10-CM | POA: Diagnosis not present

## 2012-04-21 DIAGNOSIS — I619 Nontraumatic intracerebral hemorrhage, unspecified: Secondary | ICD-10-CM | POA: Diagnosis not present

## 2012-04-21 DIAGNOSIS — R471 Dysarthria and anarthria: Secondary | ICD-10-CM | POA: Diagnosis not present

## 2012-04-21 DIAGNOSIS — R269 Unspecified abnormalities of gait and mobility: Secondary | ICD-10-CM | POA: Diagnosis not present

## 2012-04-21 DIAGNOSIS — R262 Difficulty in walking, not elsewhere classified: Secondary | ICD-10-CM | POA: Diagnosis not present

## 2012-04-21 DIAGNOSIS — R1312 Dysphagia, oropharyngeal phase: Secondary | ICD-10-CM | POA: Diagnosis not present

## 2012-04-21 DIAGNOSIS — M6281 Muscle weakness (generalized): Secondary | ICD-10-CM | POA: Diagnosis not present

## 2012-04-24 DIAGNOSIS — R471 Dysarthria and anarthria: Secondary | ICD-10-CM | POA: Diagnosis not present

## 2012-04-24 DIAGNOSIS — I619 Nontraumatic intracerebral hemorrhage, unspecified: Secondary | ICD-10-CM | POA: Diagnosis not present

## 2012-04-24 DIAGNOSIS — R262 Difficulty in walking, not elsewhere classified: Secondary | ICD-10-CM | POA: Diagnosis not present

## 2012-04-24 DIAGNOSIS — R269 Unspecified abnormalities of gait and mobility: Secondary | ICD-10-CM | POA: Diagnosis not present

## 2012-04-24 DIAGNOSIS — R1312 Dysphagia, oropharyngeal phase: Secondary | ICD-10-CM | POA: Diagnosis not present

## 2012-04-24 DIAGNOSIS — M6281 Muscle weakness (generalized): Secondary | ICD-10-CM | POA: Diagnosis not present

## 2012-04-25 DIAGNOSIS — M6281 Muscle weakness (generalized): Secondary | ICD-10-CM | POA: Diagnosis not present

## 2012-04-25 DIAGNOSIS — R269 Unspecified abnormalities of gait and mobility: Secondary | ICD-10-CM | POA: Diagnosis not present

## 2012-04-25 DIAGNOSIS — R471 Dysarthria and anarthria: Secondary | ICD-10-CM | POA: Diagnosis not present

## 2012-04-25 DIAGNOSIS — R1312 Dysphagia, oropharyngeal phase: Secondary | ICD-10-CM | POA: Diagnosis not present

## 2012-04-25 DIAGNOSIS — I619 Nontraumatic intracerebral hemorrhage, unspecified: Secondary | ICD-10-CM | POA: Diagnosis not present

## 2012-04-25 DIAGNOSIS — R262 Difficulty in walking, not elsewhere classified: Secondary | ICD-10-CM | POA: Diagnosis not present

## 2012-04-26 DIAGNOSIS — M6281 Muscle weakness (generalized): Secondary | ICD-10-CM | POA: Diagnosis not present

## 2012-04-26 DIAGNOSIS — I619 Nontraumatic intracerebral hemorrhage, unspecified: Secondary | ICD-10-CM | POA: Diagnosis not present

## 2012-04-26 DIAGNOSIS — R1312 Dysphagia, oropharyngeal phase: Secondary | ICD-10-CM | POA: Diagnosis not present

## 2012-04-26 DIAGNOSIS — R262 Difficulty in walking, not elsewhere classified: Secondary | ICD-10-CM | POA: Diagnosis not present

## 2012-04-26 DIAGNOSIS — R471 Dysarthria and anarthria: Secondary | ICD-10-CM | POA: Diagnosis not present

## 2012-04-26 DIAGNOSIS — R269 Unspecified abnormalities of gait and mobility: Secondary | ICD-10-CM | POA: Diagnosis not present

## 2012-04-27 DIAGNOSIS — I619 Nontraumatic intracerebral hemorrhage, unspecified: Secondary | ICD-10-CM | POA: Diagnosis not present

## 2012-04-27 DIAGNOSIS — R269 Unspecified abnormalities of gait and mobility: Secondary | ICD-10-CM | POA: Diagnosis not present

## 2012-04-27 DIAGNOSIS — R1312 Dysphagia, oropharyngeal phase: Secondary | ICD-10-CM | POA: Diagnosis not present

## 2012-04-27 DIAGNOSIS — R471 Dysarthria and anarthria: Secondary | ICD-10-CM | POA: Diagnosis not present

## 2012-04-27 DIAGNOSIS — M6281 Muscle weakness (generalized): Secondary | ICD-10-CM | POA: Diagnosis not present

## 2012-04-27 DIAGNOSIS — R262 Difficulty in walking, not elsewhere classified: Secondary | ICD-10-CM | POA: Diagnosis not present

## 2012-04-28 DIAGNOSIS — R471 Dysarthria and anarthria: Secondary | ICD-10-CM | POA: Diagnosis not present

## 2012-04-28 DIAGNOSIS — R262 Difficulty in walking, not elsewhere classified: Secondary | ICD-10-CM | POA: Diagnosis not present

## 2012-04-28 DIAGNOSIS — R269 Unspecified abnormalities of gait and mobility: Secondary | ICD-10-CM | POA: Diagnosis not present

## 2012-04-28 DIAGNOSIS — I619 Nontraumatic intracerebral hemorrhage, unspecified: Secondary | ICD-10-CM | POA: Diagnosis not present

## 2012-04-28 DIAGNOSIS — M6281 Muscle weakness (generalized): Secondary | ICD-10-CM | POA: Diagnosis not present

## 2012-04-28 DIAGNOSIS — R1312 Dysphagia, oropharyngeal phase: Secondary | ICD-10-CM | POA: Diagnosis not present

## 2012-05-01 DIAGNOSIS — R262 Difficulty in walking, not elsewhere classified: Secondary | ICD-10-CM | POA: Diagnosis not present

## 2012-05-01 DIAGNOSIS — R269 Unspecified abnormalities of gait and mobility: Secondary | ICD-10-CM | POA: Diagnosis not present

## 2012-05-01 DIAGNOSIS — I619 Nontraumatic intracerebral hemorrhage, unspecified: Secondary | ICD-10-CM | POA: Diagnosis not present

## 2012-05-01 DIAGNOSIS — R471 Dysarthria and anarthria: Secondary | ICD-10-CM | POA: Diagnosis not present

## 2012-05-01 DIAGNOSIS — M6281 Muscle weakness (generalized): Secondary | ICD-10-CM | POA: Diagnosis not present

## 2012-05-01 DIAGNOSIS — R1312 Dysphagia, oropharyngeal phase: Secondary | ICD-10-CM | POA: Diagnosis not present

## 2012-05-02 DIAGNOSIS — R471 Dysarthria and anarthria: Secondary | ICD-10-CM | POA: Diagnosis not present

## 2012-05-02 DIAGNOSIS — I619 Nontraumatic intracerebral hemorrhage, unspecified: Secondary | ICD-10-CM | POA: Diagnosis not present

## 2012-05-02 DIAGNOSIS — R269 Unspecified abnormalities of gait and mobility: Secondary | ICD-10-CM | POA: Diagnosis not present

## 2012-05-02 DIAGNOSIS — R1312 Dysphagia, oropharyngeal phase: Secondary | ICD-10-CM | POA: Diagnosis not present

## 2012-05-02 DIAGNOSIS — M6281 Muscle weakness (generalized): Secondary | ICD-10-CM | POA: Diagnosis not present

## 2012-05-02 DIAGNOSIS — R262 Difficulty in walking, not elsewhere classified: Secondary | ICD-10-CM | POA: Diagnosis not present

## 2012-05-03 DIAGNOSIS — M6281 Muscle weakness (generalized): Secondary | ICD-10-CM | POA: Diagnosis not present

## 2012-05-03 DIAGNOSIS — R262 Difficulty in walking, not elsewhere classified: Secondary | ICD-10-CM | POA: Diagnosis not present

## 2012-05-03 DIAGNOSIS — R1312 Dysphagia, oropharyngeal phase: Secondary | ICD-10-CM | POA: Diagnosis not present

## 2012-05-03 DIAGNOSIS — R269 Unspecified abnormalities of gait and mobility: Secondary | ICD-10-CM | POA: Diagnosis not present

## 2012-05-03 DIAGNOSIS — I619 Nontraumatic intracerebral hemorrhage, unspecified: Secondary | ICD-10-CM | POA: Diagnosis not present

## 2012-05-03 DIAGNOSIS — R471 Dysarthria and anarthria: Secondary | ICD-10-CM | POA: Diagnosis not present

## 2012-05-04 DIAGNOSIS — R5381 Other malaise: Secondary | ICD-10-CM | POA: Diagnosis not present

## 2012-05-04 DIAGNOSIS — R262 Difficulty in walking, not elsewhere classified: Secondary | ICD-10-CM | POA: Diagnosis not present

## 2012-05-04 DIAGNOSIS — E209 Hypoparathyroidism, unspecified: Secondary | ICD-10-CM | POA: Diagnosis not present

## 2012-05-04 DIAGNOSIS — E039 Hypothyroidism, unspecified: Secondary | ICD-10-CM | POA: Diagnosis not present

## 2012-05-04 DIAGNOSIS — R471 Dysarthria and anarthria: Secondary | ICD-10-CM | POA: Diagnosis not present

## 2012-05-04 DIAGNOSIS — R1312 Dysphagia, oropharyngeal phase: Secondary | ICD-10-CM | POA: Diagnosis not present

## 2012-05-04 DIAGNOSIS — M6281 Muscle weakness (generalized): Secondary | ICD-10-CM | POA: Diagnosis not present

## 2012-05-04 DIAGNOSIS — E785 Hyperlipidemia, unspecified: Secondary | ICD-10-CM | POA: Diagnosis not present

## 2012-05-04 DIAGNOSIS — N390 Urinary tract infection, site not specified: Secondary | ICD-10-CM | POA: Diagnosis not present

## 2012-05-04 DIAGNOSIS — I619 Nontraumatic intracerebral hemorrhage, unspecified: Secondary | ICD-10-CM | POA: Diagnosis not present

## 2012-05-04 DIAGNOSIS — R269 Unspecified abnormalities of gait and mobility: Secondary | ICD-10-CM | POA: Diagnosis not present

## 2012-05-04 DIAGNOSIS — D500 Iron deficiency anemia secondary to blood loss (chronic): Secondary | ICD-10-CM | POA: Diagnosis not present

## 2012-05-04 DIAGNOSIS — D518 Other vitamin B12 deficiency anemias: Secondary | ICD-10-CM | POA: Diagnosis not present

## 2012-05-04 DIAGNOSIS — I10 Essential (primary) hypertension: Secondary | ICD-10-CM | POA: Diagnosis not present

## 2012-05-05 DIAGNOSIS — R471 Dysarthria and anarthria: Secondary | ICD-10-CM | POA: Diagnosis not present

## 2012-05-05 DIAGNOSIS — I619 Nontraumatic intracerebral hemorrhage, unspecified: Secondary | ICD-10-CM | POA: Diagnosis not present

## 2012-05-05 DIAGNOSIS — R1312 Dysphagia, oropharyngeal phase: Secondary | ICD-10-CM | POA: Diagnosis not present

## 2012-05-05 DIAGNOSIS — R269 Unspecified abnormalities of gait and mobility: Secondary | ICD-10-CM | POA: Diagnosis not present

## 2012-05-05 DIAGNOSIS — R262 Difficulty in walking, not elsewhere classified: Secondary | ICD-10-CM | POA: Diagnosis not present

## 2012-05-05 DIAGNOSIS — M6281 Muscle weakness (generalized): Secondary | ICD-10-CM | POA: Diagnosis not present

## 2012-05-06 DIAGNOSIS — R509 Fever, unspecified: Secondary | ICD-10-CM | POA: Diagnosis not present

## 2012-05-08 DIAGNOSIS — R471 Dysarthria and anarthria: Secondary | ICD-10-CM | POA: Diagnosis not present

## 2012-05-08 DIAGNOSIS — R1312 Dysphagia, oropharyngeal phase: Secondary | ICD-10-CM | POA: Diagnosis not present

## 2012-05-08 DIAGNOSIS — R262 Difficulty in walking, not elsewhere classified: Secondary | ICD-10-CM | POA: Diagnosis not present

## 2012-05-08 DIAGNOSIS — M6281 Muscle weakness (generalized): Secondary | ICD-10-CM | POA: Diagnosis not present

## 2012-05-08 DIAGNOSIS — R269 Unspecified abnormalities of gait and mobility: Secondary | ICD-10-CM | POA: Diagnosis not present

## 2012-05-08 DIAGNOSIS — I619 Nontraumatic intracerebral hemorrhage, unspecified: Secondary | ICD-10-CM | POA: Diagnosis not present

## 2012-05-09 DIAGNOSIS — R1312 Dysphagia, oropharyngeal phase: Secondary | ICD-10-CM | POA: Diagnosis not present

## 2012-05-09 DIAGNOSIS — R262 Difficulty in walking, not elsewhere classified: Secondary | ICD-10-CM | POA: Diagnosis not present

## 2012-05-09 DIAGNOSIS — R269 Unspecified abnormalities of gait and mobility: Secondary | ICD-10-CM | POA: Diagnosis not present

## 2012-05-09 DIAGNOSIS — M6281 Muscle weakness (generalized): Secondary | ICD-10-CM | POA: Diagnosis not present

## 2012-05-09 DIAGNOSIS — Z0000 Encounter for general adult medical examination without abnormal findings: Secondary | ICD-10-CM | POA: Diagnosis not present

## 2012-05-09 DIAGNOSIS — R471 Dysarthria and anarthria: Secondary | ICD-10-CM | POA: Diagnosis not present

## 2012-05-09 DIAGNOSIS — I619 Nontraumatic intracerebral hemorrhage, unspecified: Secondary | ICD-10-CM | POA: Diagnosis not present

## 2012-05-10 DIAGNOSIS — I619 Nontraumatic intracerebral hemorrhage, unspecified: Secondary | ICD-10-CM | POA: Diagnosis not present

## 2012-05-10 DIAGNOSIS — R269 Unspecified abnormalities of gait and mobility: Secondary | ICD-10-CM | POA: Diagnosis not present

## 2012-05-10 DIAGNOSIS — R262 Difficulty in walking, not elsewhere classified: Secondary | ICD-10-CM | POA: Diagnosis not present

## 2012-05-10 DIAGNOSIS — M6281 Muscle weakness (generalized): Secondary | ICD-10-CM | POA: Diagnosis not present

## 2012-05-10 DIAGNOSIS — R1312 Dysphagia, oropharyngeal phase: Secondary | ICD-10-CM | POA: Diagnosis not present

## 2012-05-10 DIAGNOSIS — R471 Dysarthria and anarthria: Secondary | ICD-10-CM | POA: Diagnosis not present

## 2012-05-12 DIAGNOSIS — R1312 Dysphagia, oropharyngeal phase: Secondary | ICD-10-CM | POA: Diagnosis not present

## 2012-05-12 DIAGNOSIS — I619 Nontraumatic intracerebral hemorrhage, unspecified: Secondary | ICD-10-CM | POA: Diagnosis not present

## 2012-05-12 DIAGNOSIS — R471 Dysarthria and anarthria: Secondary | ICD-10-CM | POA: Diagnosis not present

## 2012-05-12 DIAGNOSIS — R269 Unspecified abnormalities of gait and mobility: Secondary | ICD-10-CM | POA: Diagnosis not present

## 2012-05-12 DIAGNOSIS — R262 Difficulty in walking, not elsewhere classified: Secondary | ICD-10-CM | POA: Diagnosis not present

## 2012-05-12 DIAGNOSIS — M6281 Muscle weakness (generalized): Secondary | ICD-10-CM | POA: Diagnosis not present

## 2012-05-15 DIAGNOSIS — R269 Unspecified abnormalities of gait and mobility: Secondary | ICD-10-CM | POA: Diagnosis not present

## 2012-05-15 DIAGNOSIS — R262 Difficulty in walking, not elsewhere classified: Secondary | ICD-10-CM | POA: Diagnosis not present

## 2012-05-15 DIAGNOSIS — M6281 Muscle weakness (generalized): Secondary | ICD-10-CM | POA: Diagnosis not present

## 2012-05-15 DIAGNOSIS — R471 Dysarthria and anarthria: Secondary | ICD-10-CM | POA: Diagnosis not present

## 2012-05-15 DIAGNOSIS — R1312 Dysphagia, oropharyngeal phase: Secondary | ICD-10-CM | POA: Diagnosis not present

## 2012-05-15 DIAGNOSIS — R4189 Other symptoms and signs involving cognitive functions and awareness: Secondary | ICD-10-CM | POA: Diagnosis not present

## 2012-05-15 DIAGNOSIS — I619 Nontraumatic intracerebral hemorrhage, unspecified: Secondary | ICD-10-CM | POA: Diagnosis not present

## 2012-05-16 DIAGNOSIS — R471 Dysarthria and anarthria: Secondary | ICD-10-CM | POA: Diagnosis not present

## 2012-05-16 DIAGNOSIS — R269 Unspecified abnormalities of gait and mobility: Secondary | ICD-10-CM | POA: Diagnosis not present

## 2012-05-16 DIAGNOSIS — M6281 Muscle weakness (generalized): Secondary | ICD-10-CM | POA: Diagnosis not present

## 2012-05-16 DIAGNOSIS — R262 Difficulty in walking, not elsewhere classified: Secondary | ICD-10-CM | POA: Diagnosis not present

## 2012-05-16 DIAGNOSIS — R1312 Dysphagia, oropharyngeal phase: Secondary | ICD-10-CM | POA: Diagnosis not present

## 2012-05-16 DIAGNOSIS — I619 Nontraumatic intracerebral hemorrhage, unspecified: Secondary | ICD-10-CM | POA: Diagnosis not present

## 2012-05-17 DIAGNOSIS — F320 Major depressive disorder, single episode, mild: Secondary | ICD-10-CM | POA: Diagnosis not present

## 2012-05-17 DIAGNOSIS — F22 Delusional disorders: Secondary | ICD-10-CM | POA: Diagnosis not present

## 2012-05-17 DIAGNOSIS — F09 Unspecified mental disorder due to known physiological condition: Secondary | ICD-10-CM | POA: Diagnosis not present

## 2012-05-18 DIAGNOSIS — R262 Difficulty in walking, not elsewhere classified: Secondary | ICD-10-CM | POA: Diagnosis not present

## 2012-05-18 DIAGNOSIS — M6281 Muscle weakness (generalized): Secondary | ICD-10-CM | POA: Diagnosis not present

## 2012-05-18 DIAGNOSIS — R1312 Dysphagia, oropharyngeal phase: Secondary | ICD-10-CM | POA: Diagnosis not present

## 2012-05-18 DIAGNOSIS — R269 Unspecified abnormalities of gait and mobility: Secondary | ICD-10-CM | POA: Diagnosis not present

## 2012-05-18 DIAGNOSIS — R471 Dysarthria and anarthria: Secondary | ICD-10-CM | POA: Diagnosis not present

## 2012-05-18 DIAGNOSIS — I619 Nontraumatic intracerebral hemorrhage, unspecified: Secondary | ICD-10-CM | POA: Diagnosis not present

## 2012-05-19 DIAGNOSIS — R1312 Dysphagia, oropharyngeal phase: Secondary | ICD-10-CM | POA: Diagnosis not present

## 2012-05-19 DIAGNOSIS — R269 Unspecified abnormalities of gait and mobility: Secondary | ICD-10-CM | POA: Diagnosis not present

## 2012-05-19 DIAGNOSIS — M6281 Muscle weakness (generalized): Secondary | ICD-10-CM | POA: Diagnosis not present

## 2012-05-19 DIAGNOSIS — I619 Nontraumatic intracerebral hemorrhage, unspecified: Secondary | ICD-10-CM | POA: Diagnosis not present

## 2012-05-19 DIAGNOSIS — R262 Difficulty in walking, not elsewhere classified: Secondary | ICD-10-CM | POA: Diagnosis not present

## 2012-05-19 DIAGNOSIS — R471 Dysarthria and anarthria: Secondary | ICD-10-CM | POA: Diagnosis not present

## 2012-05-22 DIAGNOSIS — R471 Dysarthria and anarthria: Secondary | ICD-10-CM | POA: Diagnosis not present

## 2012-05-22 DIAGNOSIS — R269 Unspecified abnormalities of gait and mobility: Secondary | ICD-10-CM | POA: Diagnosis not present

## 2012-05-22 DIAGNOSIS — M6281 Muscle weakness (generalized): Secondary | ICD-10-CM | POA: Diagnosis not present

## 2012-05-22 DIAGNOSIS — R1312 Dysphagia, oropharyngeal phase: Secondary | ICD-10-CM | POA: Diagnosis not present

## 2012-05-22 DIAGNOSIS — I619 Nontraumatic intracerebral hemorrhage, unspecified: Secondary | ICD-10-CM | POA: Diagnosis not present

## 2012-05-22 DIAGNOSIS — R262 Difficulty in walking, not elsewhere classified: Secondary | ICD-10-CM | POA: Diagnosis not present

## 2012-05-23 DIAGNOSIS — M6281 Muscle weakness (generalized): Secondary | ICD-10-CM | POA: Diagnosis not present

## 2012-05-23 DIAGNOSIS — R269 Unspecified abnormalities of gait and mobility: Secondary | ICD-10-CM | POA: Diagnosis not present

## 2012-05-23 DIAGNOSIS — R1312 Dysphagia, oropharyngeal phase: Secondary | ICD-10-CM | POA: Diagnosis not present

## 2012-05-23 DIAGNOSIS — R471 Dysarthria and anarthria: Secondary | ICD-10-CM | POA: Diagnosis not present

## 2012-05-23 DIAGNOSIS — R262 Difficulty in walking, not elsewhere classified: Secondary | ICD-10-CM | POA: Diagnosis not present

## 2012-05-23 DIAGNOSIS — I619 Nontraumatic intracerebral hemorrhage, unspecified: Secondary | ICD-10-CM | POA: Diagnosis not present

## 2012-05-24 DIAGNOSIS — I619 Nontraumatic intracerebral hemorrhage, unspecified: Secondary | ICD-10-CM | POA: Diagnosis not present

## 2012-05-24 DIAGNOSIS — R471 Dysarthria and anarthria: Secondary | ICD-10-CM | POA: Diagnosis not present

## 2012-05-24 DIAGNOSIS — R262 Difficulty in walking, not elsewhere classified: Secondary | ICD-10-CM | POA: Diagnosis not present

## 2012-05-24 DIAGNOSIS — R269 Unspecified abnormalities of gait and mobility: Secondary | ICD-10-CM | POA: Diagnosis not present

## 2012-05-24 DIAGNOSIS — M6281 Muscle weakness (generalized): Secondary | ICD-10-CM | POA: Diagnosis not present

## 2012-05-24 DIAGNOSIS — R1312 Dysphagia, oropharyngeal phase: Secondary | ICD-10-CM | POA: Diagnosis not present

## 2012-05-25 DIAGNOSIS — R269 Unspecified abnormalities of gait and mobility: Secondary | ICD-10-CM | POA: Diagnosis not present

## 2012-05-25 DIAGNOSIS — R1312 Dysphagia, oropharyngeal phase: Secondary | ICD-10-CM | POA: Diagnosis not present

## 2012-05-25 DIAGNOSIS — R262 Difficulty in walking, not elsewhere classified: Secondary | ICD-10-CM | POA: Diagnosis not present

## 2012-05-25 DIAGNOSIS — I619 Nontraumatic intracerebral hemorrhage, unspecified: Secondary | ICD-10-CM | POA: Diagnosis not present

## 2012-05-25 DIAGNOSIS — R471 Dysarthria and anarthria: Secondary | ICD-10-CM | POA: Diagnosis not present

## 2012-05-25 DIAGNOSIS — M6281 Muscle weakness (generalized): Secondary | ICD-10-CM | POA: Diagnosis not present

## 2012-05-26 DIAGNOSIS — R1312 Dysphagia, oropharyngeal phase: Secondary | ICD-10-CM | POA: Diagnosis not present

## 2012-05-26 DIAGNOSIS — R269 Unspecified abnormalities of gait and mobility: Secondary | ICD-10-CM | POA: Diagnosis not present

## 2012-05-26 DIAGNOSIS — M6281 Muscle weakness (generalized): Secondary | ICD-10-CM | POA: Diagnosis not present

## 2012-05-26 DIAGNOSIS — I619 Nontraumatic intracerebral hemorrhage, unspecified: Secondary | ICD-10-CM | POA: Diagnosis not present

## 2012-05-26 DIAGNOSIS — R262 Difficulty in walking, not elsewhere classified: Secondary | ICD-10-CM | POA: Diagnosis not present

## 2012-05-26 DIAGNOSIS — R471 Dysarthria and anarthria: Secondary | ICD-10-CM | POA: Diagnosis not present

## 2012-05-29 DIAGNOSIS — F22 Delusional disorders: Secondary | ICD-10-CM | POA: Diagnosis not present

## 2012-05-29 DIAGNOSIS — R262 Difficulty in walking, not elsewhere classified: Secondary | ICD-10-CM | POA: Diagnosis not present

## 2012-05-29 DIAGNOSIS — R471 Dysarthria and anarthria: Secondary | ICD-10-CM | POA: Diagnosis not present

## 2012-05-29 DIAGNOSIS — F320 Major depressive disorder, single episode, mild: Secondary | ICD-10-CM | POA: Diagnosis not present

## 2012-05-29 DIAGNOSIS — R269 Unspecified abnormalities of gait and mobility: Secondary | ICD-10-CM | POA: Diagnosis not present

## 2012-05-29 DIAGNOSIS — I619 Nontraumatic intracerebral hemorrhage, unspecified: Secondary | ICD-10-CM | POA: Diagnosis not present

## 2012-05-29 DIAGNOSIS — R1312 Dysphagia, oropharyngeal phase: Secondary | ICD-10-CM | POA: Diagnosis not present

## 2012-05-29 DIAGNOSIS — M6281 Muscle weakness (generalized): Secondary | ICD-10-CM | POA: Diagnosis not present

## 2012-05-29 DIAGNOSIS — R6889 Other general symptoms and signs: Secondary | ICD-10-CM | POA: Diagnosis not present

## 2012-05-30 DIAGNOSIS — R471 Dysarthria and anarthria: Secondary | ICD-10-CM | POA: Diagnosis not present

## 2012-05-30 DIAGNOSIS — R1312 Dysphagia, oropharyngeal phase: Secondary | ICD-10-CM | POA: Diagnosis not present

## 2012-05-30 DIAGNOSIS — R262 Difficulty in walking, not elsewhere classified: Secondary | ICD-10-CM | POA: Diagnosis not present

## 2012-05-30 DIAGNOSIS — I619 Nontraumatic intracerebral hemorrhage, unspecified: Secondary | ICD-10-CM | POA: Diagnosis not present

## 2012-05-30 DIAGNOSIS — R269 Unspecified abnormalities of gait and mobility: Secondary | ICD-10-CM | POA: Diagnosis not present

## 2012-05-30 DIAGNOSIS — M6281 Muscle weakness (generalized): Secondary | ICD-10-CM | POA: Diagnosis not present

## 2012-05-31 DIAGNOSIS — R1310 Dysphagia, unspecified: Secondary | ICD-10-CM | POA: Diagnosis not present

## 2012-05-31 DIAGNOSIS — R262 Difficulty in walking, not elsewhere classified: Secondary | ICD-10-CM | POA: Diagnosis not present

## 2012-05-31 DIAGNOSIS — F29 Unspecified psychosis not due to a substance or known physiological condition: Secondary | ICD-10-CM | POA: Diagnosis not present

## 2012-05-31 DIAGNOSIS — M6281 Muscle weakness (generalized): Secondary | ICD-10-CM | POA: Diagnosis not present

## 2012-05-31 DIAGNOSIS — R300 Dysuria: Secondary | ICD-10-CM | POA: Diagnosis not present

## 2012-05-31 DIAGNOSIS — R269 Unspecified abnormalities of gait and mobility: Secondary | ICD-10-CM | POA: Diagnosis not present

## 2012-05-31 DIAGNOSIS — I619 Nontraumatic intracerebral hemorrhage, unspecified: Secondary | ICD-10-CM | POA: Diagnosis not present

## 2012-05-31 DIAGNOSIS — R1312 Dysphagia, oropharyngeal phase: Secondary | ICD-10-CM | POA: Diagnosis not present

## 2012-05-31 DIAGNOSIS — F411 Generalized anxiety disorder: Secondary | ICD-10-CM | POA: Diagnosis not present

## 2012-05-31 DIAGNOSIS — R471 Dysarthria and anarthria: Secondary | ICD-10-CM | POA: Diagnosis not present

## 2012-06-01 DIAGNOSIS — R471 Dysarthria and anarthria: Secondary | ICD-10-CM | POA: Diagnosis not present

## 2012-06-01 DIAGNOSIS — R269 Unspecified abnormalities of gait and mobility: Secondary | ICD-10-CM | POA: Diagnosis not present

## 2012-06-01 DIAGNOSIS — R262 Difficulty in walking, not elsewhere classified: Secondary | ICD-10-CM | POA: Diagnosis not present

## 2012-06-01 DIAGNOSIS — I619 Nontraumatic intracerebral hemorrhage, unspecified: Secondary | ICD-10-CM | POA: Diagnosis not present

## 2012-06-01 DIAGNOSIS — R1312 Dysphagia, oropharyngeal phase: Secondary | ICD-10-CM | POA: Diagnosis not present

## 2012-06-01 DIAGNOSIS — M6281 Muscle weakness (generalized): Secondary | ICD-10-CM | POA: Diagnosis not present

## 2012-06-02 DIAGNOSIS — M6281 Muscle weakness (generalized): Secondary | ICD-10-CM | POA: Diagnosis not present

## 2012-06-02 DIAGNOSIS — R1312 Dysphagia, oropharyngeal phase: Secondary | ICD-10-CM | POA: Diagnosis not present

## 2012-06-02 DIAGNOSIS — R471 Dysarthria and anarthria: Secondary | ICD-10-CM | POA: Diagnosis not present

## 2012-06-02 DIAGNOSIS — R269 Unspecified abnormalities of gait and mobility: Secondary | ICD-10-CM | POA: Diagnosis not present

## 2012-06-02 DIAGNOSIS — I619 Nontraumatic intracerebral hemorrhage, unspecified: Secondary | ICD-10-CM | POA: Diagnosis not present

## 2012-06-02 DIAGNOSIS — R262 Difficulty in walking, not elsewhere classified: Secondary | ICD-10-CM | POA: Diagnosis not present

## 2012-06-04 DIAGNOSIS — I619 Nontraumatic intracerebral hemorrhage, unspecified: Secondary | ICD-10-CM | POA: Diagnosis not present

## 2012-06-04 DIAGNOSIS — R269 Unspecified abnormalities of gait and mobility: Secondary | ICD-10-CM | POA: Diagnosis not present

## 2012-06-04 DIAGNOSIS — R1312 Dysphagia, oropharyngeal phase: Secondary | ICD-10-CM | POA: Diagnosis not present

## 2012-06-04 DIAGNOSIS — M6281 Muscle weakness (generalized): Secondary | ICD-10-CM | POA: Diagnosis not present

## 2012-06-04 DIAGNOSIS — R471 Dysarthria and anarthria: Secondary | ICD-10-CM | POA: Diagnosis not present

## 2012-06-04 DIAGNOSIS — R262 Difficulty in walking, not elsewhere classified: Secondary | ICD-10-CM | POA: Diagnosis not present

## 2012-06-05 DIAGNOSIS — R262 Difficulty in walking, not elsewhere classified: Secondary | ICD-10-CM | POA: Diagnosis not present

## 2012-06-05 DIAGNOSIS — R269 Unspecified abnormalities of gait and mobility: Secondary | ICD-10-CM | POA: Diagnosis not present

## 2012-06-05 DIAGNOSIS — M6281 Muscle weakness (generalized): Secondary | ICD-10-CM | POA: Diagnosis not present

## 2012-06-05 DIAGNOSIS — R471 Dysarthria and anarthria: Secondary | ICD-10-CM | POA: Diagnosis not present

## 2012-06-05 DIAGNOSIS — I619 Nontraumatic intracerebral hemorrhage, unspecified: Secondary | ICD-10-CM | POA: Diagnosis not present

## 2012-06-05 DIAGNOSIS — R1312 Dysphagia, oropharyngeal phase: Secondary | ICD-10-CM | POA: Diagnosis not present

## 2012-06-06 DIAGNOSIS — R1312 Dysphagia, oropharyngeal phase: Secondary | ICD-10-CM | POA: Diagnosis not present

## 2012-06-06 DIAGNOSIS — I619 Nontraumatic intracerebral hemorrhage, unspecified: Secondary | ICD-10-CM | POA: Diagnosis not present

## 2012-06-06 DIAGNOSIS — M6281 Muscle weakness (generalized): Secondary | ICD-10-CM | POA: Diagnosis not present

## 2012-06-06 DIAGNOSIS — R262 Difficulty in walking, not elsewhere classified: Secondary | ICD-10-CM | POA: Diagnosis not present

## 2012-06-06 DIAGNOSIS — R269 Unspecified abnormalities of gait and mobility: Secondary | ICD-10-CM | POA: Diagnosis not present

## 2012-06-06 DIAGNOSIS — R471 Dysarthria and anarthria: Secondary | ICD-10-CM | POA: Diagnosis not present

## 2012-06-08 DIAGNOSIS — R262 Difficulty in walking, not elsewhere classified: Secondary | ICD-10-CM | POA: Diagnosis not present

## 2012-06-08 DIAGNOSIS — M6281 Muscle weakness (generalized): Secondary | ICD-10-CM | POA: Diagnosis not present

## 2012-06-08 DIAGNOSIS — I619 Nontraumatic intracerebral hemorrhage, unspecified: Secondary | ICD-10-CM | POA: Diagnosis not present

## 2012-06-08 DIAGNOSIS — R471 Dysarthria and anarthria: Secondary | ICD-10-CM | POA: Diagnosis not present

## 2012-06-08 DIAGNOSIS — R269 Unspecified abnormalities of gait and mobility: Secondary | ICD-10-CM | POA: Diagnosis not present

## 2012-06-08 DIAGNOSIS — R1312 Dysphagia, oropharyngeal phase: Secondary | ICD-10-CM | POA: Diagnosis not present

## 2012-06-09 DIAGNOSIS — I619 Nontraumatic intracerebral hemorrhage, unspecified: Secondary | ICD-10-CM | POA: Diagnosis not present

## 2012-06-09 DIAGNOSIS — R269 Unspecified abnormalities of gait and mobility: Secondary | ICD-10-CM | POA: Diagnosis not present

## 2012-06-09 DIAGNOSIS — R1312 Dysphagia, oropharyngeal phase: Secondary | ICD-10-CM | POA: Diagnosis not present

## 2012-06-09 DIAGNOSIS — M6281 Muscle weakness (generalized): Secondary | ICD-10-CM | POA: Diagnosis not present

## 2012-06-09 DIAGNOSIS — R471 Dysarthria and anarthria: Secondary | ICD-10-CM | POA: Diagnosis not present

## 2012-06-09 DIAGNOSIS — R262 Difficulty in walking, not elsewhere classified: Secondary | ICD-10-CM | POA: Diagnosis not present

## 2012-06-12 DIAGNOSIS — R471 Dysarthria and anarthria: Secondary | ICD-10-CM | POA: Diagnosis not present

## 2012-06-12 DIAGNOSIS — M6281 Muscle weakness (generalized): Secondary | ICD-10-CM | POA: Diagnosis not present

## 2012-06-12 DIAGNOSIS — R1312 Dysphagia, oropharyngeal phase: Secondary | ICD-10-CM | POA: Diagnosis not present

## 2012-06-12 DIAGNOSIS — R262 Difficulty in walking, not elsewhere classified: Secondary | ICD-10-CM | POA: Diagnosis not present

## 2012-06-12 DIAGNOSIS — I619 Nontraumatic intracerebral hemorrhage, unspecified: Secondary | ICD-10-CM | POA: Diagnosis not present

## 2012-06-12 DIAGNOSIS — R269 Unspecified abnormalities of gait and mobility: Secondary | ICD-10-CM | POA: Diagnosis not present

## 2012-06-13 DIAGNOSIS — R269 Unspecified abnormalities of gait and mobility: Secondary | ICD-10-CM | POA: Diagnosis not present

## 2012-06-13 DIAGNOSIS — R471 Dysarthria and anarthria: Secondary | ICD-10-CM | POA: Diagnosis not present

## 2012-06-13 DIAGNOSIS — R1312 Dysphagia, oropharyngeal phase: Secondary | ICD-10-CM | POA: Diagnosis not present

## 2012-06-13 DIAGNOSIS — M6281 Muscle weakness (generalized): Secondary | ICD-10-CM | POA: Diagnosis not present

## 2012-06-13 DIAGNOSIS — R262 Difficulty in walking, not elsewhere classified: Secondary | ICD-10-CM | POA: Diagnosis not present

## 2012-06-13 DIAGNOSIS — I619 Nontraumatic intracerebral hemorrhage, unspecified: Secondary | ICD-10-CM | POA: Diagnosis not present

## 2012-06-17 DIAGNOSIS — M25539 Pain in unspecified wrist: Secondary | ICD-10-CM | POA: Diagnosis not present

## 2012-06-17 DIAGNOSIS — M25529 Pain in unspecified elbow: Secondary | ICD-10-CM | POA: Diagnosis not present

## 2012-06-17 DIAGNOSIS — M25519 Pain in unspecified shoulder: Secondary | ICD-10-CM | POA: Diagnosis not present

## 2012-06-24 DIAGNOSIS — N390 Urinary tract infection, site not specified: Secondary | ICD-10-CM | POA: Diagnosis not present

## 2012-06-26 DIAGNOSIS — F22 Delusional disorders: Secondary | ICD-10-CM | POA: Diagnosis not present

## 2012-06-26 DIAGNOSIS — F320 Major depressive disorder, single episode, mild: Secondary | ICD-10-CM | POA: Diagnosis not present

## 2012-07-15 DIAGNOSIS — R279 Unspecified lack of coordination: Secondary | ICD-10-CM | POA: Diagnosis not present

## 2012-07-15 DIAGNOSIS — M6281 Muscle weakness (generalized): Secondary | ICD-10-CM | POA: Diagnosis not present

## 2012-07-15 DIAGNOSIS — I619 Nontraumatic intracerebral hemorrhage, unspecified: Secondary | ICD-10-CM | POA: Diagnosis not present

## 2012-07-15 DIAGNOSIS — R262 Difficulty in walking, not elsewhere classified: Secondary | ICD-10-CM | POA: Diagnosis not present

## 2012-07-15 DIAGNOSIS — R269 Unspecified abnormalities of gait and mobility: Secondary | ICD-10-CM | POA: Diagnosis not present

## 2012-07-31 DIAGNOSIS — H04129 Dry eye syndrome of unspecified lacrimal gland: Secondary | ICD-10-CM | POA: Diagnosis not present

## 2012-07-31 DIAGNOSIS — Z961 Presence of intraocular lens: Secondary | ICD-10-CM | POA: Diagnosis not present

## 2012-08-05 DIAGNOSIS — E785 Hyperlipidemia, unspecified: Secondary | ICD-10-CM | POA: Diagnosis not present

## 2012-08-05 DIAGNOSIS — E039 Hypothyroidism, unspecified: Secondary | ICD-10-CM | POA: Diagnosis not present

## 2012-08-06 DIAGNOSIS — F29 Unspecified psychosis not due to a substance or known physiological condition: Secondary | ICD-10-CM | POA: Diagnosis not present

## 2012-08-15 DIAGNOSIS — E039 Hypothyroidism, unspecified: Secondary | ICD-10-CM | POA: Diagnosis not present

## 2012-08-15 DIAGNOSIS — R7309 Other abnormal glucose: Secondary | ICD-10-CM | POA: Diagnosis not present

## 2012-08-15 DIAGNOSIS — R269 Unspecified abnormalities of gait and mobility: Secondary | ICD-10-CM | POA: Diagnosis not present

## 2012-08-15 DIAGNOSIS — E119 Type 2 diabetes mellitus without complications: Secondary | ICD-10-CM | POA: Diagnosis not present

## 2012-08-15 DIAGNOSIS — D649 Anemia, unspecified: Secondary | ICD-10-CM | POA: Diagnosis not present

## 2012-08-16 DIAGNOSIS — R262 Difficulty in walking, not elsewhere classified: Secondary | ICD-10-CM | POA: Diagnosis not present

## 2012-08-16 DIAGNOSIS — R279 Unspecified lack of coordination: Secondary | ICD-10-CM | POA: Diagnosis not present

## 2012-08-16 DIAGNOSIS — I619 Nontraumatic intracerebral hemorrhage, unspecified: Secondary | ICD-10-CM | POA: Diagnosis not present

## 2012-08-16 DIAGNOSIS — M6281 Muscle weakness (generalized): Secondary | ICD-10-CM | POA: Diagnosis not present

## 2012-08-17 DIAGNOSIS — R262 Difficulty in walking, not elsewhere classified: Secondary | ICD-10-CM | POA: Diagnosis not present

## 2012-08-17 DIAGNOSIS — I619 Nontraumatic intracerebral hemorrhage, unspecified: Secondary | ICD-10-CM | POA: Diagnosis not present

## 2012-08-17 DIAGNOSIS — R279 Unspecified lack of coordination: Secondary | ICD-10-CM | POA: Diagnosis not present

## 2012-08-19 DIAGNOSIS — R269 Unspecified abnormalities of gait and mobility: Secondary | ICD-10-CM | POA: Diagnosis not present

## 2012-08-19 DIAGNOSIS — I619 Nontraumatic intracerebral hemorrhage, unspecified: Secondary | ICD-10-CM | POA: Diagnosis not present

## 2012-08-19 DIAGNOSIS — R279 Unspecified lack of coordination: Secondary | ICD-10-CM | POA: Diagnosis not present

## 2012-08-21 DIAGNOSIS — M6281 Muscle weakness (generalized): Secondary | ICD-10-CM | POA: Diagnosis not present

## 2012-08-21 DIAGNOSIS — R269 Unspecified abnormalities of gait and mobility: Secondary | ICD-10-CM | POA: Diagnosis not present

## 2012-08-22 DIAGNOSIS — R269 Unspecified abnormalities of gait and mobility: Secondary | ICD-10-CM | POA: Diagnosis not present

## 2012-08-23 DIAGNOSIS — M6281 Muscle weakness (generalized): Secondary | ICD-10-CM | POA: Diagnosis not present

## 2012-08-23 DIAGNOSIS — R262 Difficulty in walking, not elsewhere classified: Secondary | ICD-10-CM | POA: Diagnosis not present

## 2012-08-24 DIAGNOSIS — M6281 Muscle weakness (generalized): Secondary | ICD-10-CM | POA: Diagnosis not present

## 2012-08-24 DIAGNOSIS — I619 Nontraumatic intracerebral hemorrhage, unspecified: Secondary | ICD-10-CM | POA: Diagnosis not present

## 2012-08-25 DIAGNOSIS — R269 Unspecified abnormalities of gait and mobility: Secondary | ICD-10-CM | POA: Diagnosis not present

## 2012-08-25 DIAGNOSIS — M6281 Muscle weakness (generalized): Secondary | ICD-10-CM | POA: Diagnosis not present

## 2012-08-25 DIAGNOSIS — R279 Unspecified lack of coordination: Secondary | ICD-10-CM | POA: Diagnosis not present

## 2012-08-26 DIAGNOSIS — F329 Major depressive disorder, single episode, unspecified: Secondary | ICD-10-CM | POA: Diagnosis not present

## 2012-08-29 DIAGNOSIS — R279 Unspecified lack of coordination: Secondary | ICD-10-CM | POA: Diagnosis not present

## 2012-08-29 DIAGNOSIS — R269 Unspecified abnormalities of gait and mobility: Secondary | ICD-10-CM | POA: Diagnosis not present

## 2012-08-29 DIAGNOSIS — M6281 Muscle weakness (generalized): Secondary | ICD-10-CM | POA: Diagnosis not present

## 2012-08-29 DIAGNOSIS — R262 Difficulty in walking, not elsewhere classified: Secondary | ICD-10-CM | POA: Diagnosis not present

## 2012-08-31 DIAGNOSIS — R262 Difficulty in walking, not elsewhere classified: Secondary | ICD-10-CM | POA: Diagnosis not present

## 2012-08-31 DIAGNOSIS — R0602 Shortness of breath: Secondary | ICD-10-CM | POA: Diagnosis not present

## 2012-08-31 DIAGNOSIS — M6281 Muscle weakness (generalized): Secondary | ICD-10-CM | POA: Diagnosis not present

## 2012-08-31 DIAGNOSIS — N390 Urinary tract infection, site not specified: Secondary | ICD-10-CM | POA: Diagnosis not present

## 2012-09-01 DIAGNOSIS — R279 Unspecified lack of coordination: Secondary | ICD-10-CM | POA: Diagnosis not present

## 2012-09-01 DIAGNOSIS — R269 Unspecified abnormalities of gait and mobility: Secondary | ICD-10-CM | POA: Diagnosis not present

## 2012-09-01 DIAGNOSIS — I619 Nontraumatic intracerebral hemorrhage, unspecified: Secondary | ICD-10-CM | POA: Diagnosis not present

## 2012-09-01 DIAGNOSIS — M6281 Muscle weakness (generalized): Secondary | ICD-10-CM | POA: Diagnosis not present

## 2012-09-01 DIAGNOSIS — R262 Difficulty in walking, not elsewhere classified: Secondary | ICD-10-CM | POA: Diagnosis not present

## 2012-09-04 DIAGNOSIS — R269 Unspecified abnormalities of gait and mobility: Secondary | ICD-10-CM | POA: Diagnosis not present

## 2012-09-04 DIAGNOSIS — R262 Difficulty in walking, not elsewhere classified: Secondary | ICD-10-CM | POA: Diagnosis not present

## 2012-09-04 DIAGNOSIS — M6281 Muscle weakness (generalized): Secondary | ICD-10-CM | POA: Diagnosis not present

## 2012-09-04 DIAGNOSIS — I619 Nontraumatic intracerebral hemorrhage, unspecified: Secondary | ICD-10-CM | POA: Diagnosis not present

## 2012-09-04 DIAGNOSIS — R279 Unspecified lack of coordination: Secondary | ICD-10-CM | POA: Diagnosis not present

## 2012-10-08 DIAGNOSIS — F329 Major depressive disorder, single episode, unspecified: Secondary | ICD-10-CM | POA: Diagnosis not present

## 2012-10-08 DIAGNOSIS — F29 Unspecified psychosis not due to a substance or known physiological condition: Secondary | ICD-10-CM | POA: Diagnosis not present

## 2012-10-31 DIAGNOSIS — I619 Nontraumatic intracerebral hemorrhage, unspecified: Secondary | ICD-10-CM | POA: Diagnosis not present

## 2012-11-13 DIAGNOSIS — R269 Unspecified abnormalities of gait and mobility: Secondary | ICD-10-CM | POA: Diagnosis not present

## 2012-11-13 DIAGNOSIS — I619 Nontraumatic intracerebral hemorrhage, unspecified: Secondary | ICD-10-CM | POA: Diagnosis not present

## 2012-11-13 DIAGNOSIS — M6281 Muscle weakness (generalized): Secondary | ICD-10-CM | POA: Diagnosis not present

## 2012-11-14 DIAGNOSIS — E039 Hypothyroidism, unspecified: Secondary | ICD-10-CM | POA: Diagnosis not present

## 2012-11-14 DIAGNOSIS — E119 Type 2 diabetes mellitus without complications: Secondary | ICD-10-CM | POA: Diagnosis not present

## 2012-11-14 DIAGNOSIS — Z79899 Other long term (current) drug therapy: Secondary | ICD-10-CM | POA: Diagnosis not present

## 2012-11-14 DIAGNOSIS — E785 Hyperlipidemia, unspecified: Secondary | ICD-10-CM | POA: Diagnosis not present

## 2012-11-26 DIAGNOSIS — E119 Type 2 diabetes mellitus without complications: Secondary | ICD-10-CM | POA: Diagnosis not present

## 2012-11-26 DIAGNOSIS — N390 Urinary tract infection, site not specified: Secondary | ICD-10-CM | POA: Diagnosis not present

## 2012-12-03 DIAGNOSIS — F29 Unspecified psychosis not due to a substance or known physiological condition: Secondary | ICD-10-CM | POA: Diagnosis not present

## 2012-12-03 DIAGNOSIS — F329 Major depressive disorder, single episode, unspecified: Secondary | ICD-10-CM | POA: Diagnosis not present

## 2012-12-10 DIAGNOSIS — N390 Urinary tract infection, site not specified: Secondary | ICD-10-CM | POA: Diagnosis not present

## 2012-12-12 DIAGNOSIS — M6281 Muscle weakness (generalized): Secondary | ICD-10-CM | POA: Diagnosis not present

## 2012-12-12 DIAGNOSIS — I619 Nontraumatic intracerebral hemorrhage, unspecified: Secondary | ICD-10-CM | POA: Diagnosis not present

## 2012-12-12 DIAGNOSIS — R269 Unspecified abnormalities of gait and mobility: Secondary | ICD-10-CM | POA: Diagnosis not present

## 2012-12-13 DIAGNOSIS — R269 Unspecified abnormalities of gait and mobility: Secondary | ICD-10-CM | POA: Diagnosis not present

## 2012-12-13 DIAGNOSIS — I619 Nontraumatic intracerebral hemorrhage, unspecified: Secondary | ICD-10-CM | POA: Diagnosis not present

## 2012-12-13 DIAGNOSIS — M6281 Muscle weakness (generalized): Secondary | ICD-10-CM | POA: Diagnosis not present

## 2012-12-14 DIAGNOSIS — R269 Unspecified abnormalities of gait and mobility: Secondary | ICD-10-CM | POA: Diagnosis not present

## 2012-12-14 DIAGNOSIS — I619 Nontraumatic intracerebral hemorrhage, unspecified: Secondary | ICD-10-CM | POA: Diagnosis not present

## 2012-12-14 DIAGNOSIS — M6281 Muscle weakness (generalized): Secondary | ICD-10-CM | POA: Diagnosis not present

## 2012-12-15 DIAGNOSIS — I619 Nontraumatic intracerebral hemorrhage, unspecified: Secondary | ICD-10-CM | POA: Diagnosis not present

## 2012-12-15 DIAGNOSIS — M6281 Muscle weakness (generalized): Secondary | ICD-10-CM | POA: Diagnosis not present

## 2012-12-15 DIAGNOSIS — R269 Unspecified abnormalities of gait and mobility: Secondary | ICD-10-CM | POA: Diagnosis not present

## 2012-12-19 DIAGNOSIS — R269 Unspecified abnormalities of gait and mobility: Secondary | ICD-10-CM | POA: Diagnosis not present

## 2012-12-19 DIAGNOSIS — M6281 Muscle weakness (generalized): Secondary | ICD-10-CM | POA: Diagnosis not present

## 2012-12-19 DIAGNOSIS — I619 Nontraumatic intracerebral hemorrhage, unspecified: Secondary | ICD-10-CM | POA: Diagnosis not present

## 2012-12-20 DIAGNOSIS — M6281 Muscle weakness (generalized): Secondary | ICD-10-CM | POA: Diagnosis not present

## 2012-12-20 DIAGNOSIS — R269 Unspecified abnormalities of gait and mobility: Secondary | ICD-10-CM | POA: Diagnosis not present

## 2012-12-20 DIAGNOSIS — I619 Nontraumatic intracerebral hemorrhage, unspecified: Secondary | ICD-10-CM | POA: Diagnosis not present

## 2012-12-21 DIAGNOSIS — R269 Unspecified abnormalities of gait and mobility: Secondary | ICD-10-CM | POA: Diagnosis not present

## 2012-12-21 DIAGNOSIS — M6281 Muscle weakness (generalized): Secondary | ICD-10-CM | POA: Diagnosis not present

## 2012-12-21 DIAGNOSIS — I619 Nontraumatic intracerebral hemorrhage, unspecified: Secondary | ICD-10-CM | POA: Diagnosis not present

## 2012-12-22 DIAGNOSIS — M6281 Muscle weakness (generalized): Secondary | ICD-10-CM | POA: Diagnosis not present

## 2012-12-22 DIAGNOSIS — I619 Nontraumatic intracerebral hemorrhage, unspecified: Secondary | ICD-10-CM | POA: Diagnosis not present

## 2012-12-22 DIAGNOSIS — R269 Unspecified abnormalities of gait and mobility: Secondary | ICD-10-CM | POA: Diagnosis not present

## 2012-12-25 DIAGNOSIS — I619 Nontraumatic intracerebral hemorrhage, unspecified: Secondary | ICD-10-CM | POA: Diagnosis not present

## 2012-12-25 DIAGNOSIS — M6281 Muscle weakness (generalized): Secondary | ICD-10-CM | POA: Diagnosis not present

## 2012-12-25 DIAGNOSIS — E119 Type 2 diabetes mellitus without complications: Secondary | ICD-10-CM | POA: Diagnosis not present

## 2012-12-25 DIAGNOSIS — R269 Unspecified abnormalities of gait and mobility: Secondary | ICD-10-CM | POA: Diagnosis not present

## 2012-12-25 DIAGNOSIS — E039 Hypothyroidism, unspecified: Secondary | ICD-10-CM | POA: Diagnosis not present

## 2012-12-25 DIAGNOSIS — F329 Major depressive disorder, single episode, unspecified: Secondary | ICD-10-CM | POA: Diagnosis not present

## 2012-12-25 DIAGNOSIS — F29 Unspecified psychosis not due to a substance or known physiological condition: Secondary | ICD-10-CM | POA: Diagnosis not present

## 2012-12-26 DIAGNOSIS — I619 Nontraumatic intracerebral hemorrhage, unspecified: Secondary | ICD-10-CM | POA: Diagnosis not present

## 2012-12-26 DIAGNOSIS — Z79899 Other long term (current) drug therapy: Secondary | ICD-10-CM | POA: Diagnosis not present

## 2012-12-26 DIAGNOSIS — E039 Hypothyroidism, unspecified: Secondary | ICD-10-CM | POA: Diagnosis not present

## 2012-12-26 DIAGNOSIS — R269 Unspecified abnormalities of gait and mobility: Secondary | ICD-10-CM | POA: Diagnosis not present

## 2012-12-26 DIAGNOSIS — M6281 Muscle weakness (generalized): Secondary | ICD-10-CM | POA: Diagnosis not present

## 2012-12-27 DIAGNOSIS — R269 Unspecified abnormalities of gait and mobility: Secondary | ICD-10-CM | POA: Diagnosis not present

## 2012-12-27 DIAGNOSIS — I619 Nontraumatic intracerebral hemorrhage, unspecified: Secondary | ICD-10-CM | POA: Diagnosis not present

## 2012-12-27 DIAGNOSIS — M6281 Muscle weakness (generalized): Secondary | ICD-10-CM | POA: Diagnosis not present

## 2012-12-28 DIAGNOSIS — M6281 Muscle weakness (generalized): Secondary | ICD-10-CM | POA: Diagnosis not present

## 2012-12-28 DIAGNOSIS — R269 Unspecified abnormalities of gait and mobility: Secondary | ICD-10-CM | POA: Diagnosis not present

## 2012-12-28 DIAGNOSIS — I619 Nontraumatic intracerebral hemorrhage, unspecified: Secondary | ICD-10-CM | POA: Diagnosis not present

## 2012-12-29 DIAGNOSIS — I619 Nontraumatic intracerebral hemorrhage, unspecified: Secondary | ICD-10-CM | POA: Diagnosis not present

## 2012-12-29 DIAGNOSIS — M6281 Muscle weakness (generalized): Secondary | ICD-10-CM | POA: Diagnosis not present

## 2012-12-29 DIAGNOSIS — R269 Unspecified abnormalities of gait and mobility: Secondary | ICD-10-CM | POA: Diagnosis not present

## 2013-01-10 DIAGNOSIS — K219 Gastro-esophageal reflux disease without esophagitis: Secondary | ICD-10-CM | POA: Diagnosis not present

## 2013-01-10 DIAGNOSIS — R21 Rash and other nonspecific skin eruption: Secondary | ICD-10-CM | POA: Diagnosis not present

## 2013-01-10 DIAGNOSIS — R05 Cough: Secondary | ICD-10-CM | POA: Diagnosis not present

## 2013-01-15 DIAGNOSIS — Z961 Presence of intraocular lens: Secondary | ICD-10-CM | POA: Diagnosis not present

## 2013-01-15 DIAGNOSIS — H04129 Dry eye syndrome of unspecified lacrimal gland: Secondary | ICD-10-CM | POA: Diagnosis not present

## 2013-01-27 DIAGNOSIS — F329 Major depressive disorder, single episode, unspecified: Secondary | ICD-10-CM | POA: Diagnosis not present

## 2013-01-27 DIAGNOSIS — F29 Unspecified psychosis not due to a substance or known physiological condition: Secondary | ICD-10-CM | POA: Diagnosis not present

## 2013-02-14 DIAGNOSIS — E119 Type 2 diabetes mellitus without complications: Secondary | ICD-10-CM | POA: Diagnosis not present

## 2013-02-14 DIAGNOSIS — R21 Rash and other nonspecific skin eruption: Secondary | ICD-10-CM | POA: Diagnosis not present

## 2013-02-14 DIAGNOSIS — R05 Cough: Secondary | ICD-10-CM | POA: Diagnosis not present

## 2013-02-14 DIAGNOSIS — L851 Acquired keratosis [keratoderma] palmaris et plantaris: Secondary | ICD-10-CM | POA: Diagnosis not present

## 2013-02-17 DIAGNOSIS — N390 Urinary tract infection, site not specified: Secondary | ICD-10-CM | POA: Diagnosis not present

## 2013-02-17 DIAGNOSIS — Z79899 Other long term (current) drug therapy: Secondary | ICD-10-CM | POA: Diagnosis not present

## 2013-02-17 DIAGNOSIS — R300 Dysuria: Secondary | ICD-10-CM | POA: Diagnosis not present

## 2013-02-19 DIAGNOSIS — N390 Urinary tract infection, site not specified: Secondary | ICD-10-CM | POA: Diagnosis not present

## 2013-02-19 DIAGNOSIS — R5381 Other malaise: Secondary | ICD-10-CM | POA: Diagnosis not present

## 2013-02-19 DIAGNOSIS — I6350 Cerebral infarction due to unspecified occlusion or stenosis of unspecified cerebral artery: Secondary | ICD-10-CM | POA: Diagnosis not present

## 2013-02-19 DIAGNOSIS — F068 Other specified mental disorders due to known physiological condition: Secondary | ICD-10-CM | POA: Diagnosis not present

## 2013-03-03 DIAGNOSIS — F29 Unspecified psychosis not due to a substance or known physiological condition: Secondary | ICD-10-CM | POA: Diagnosis not present

## 2013-03-03 DIAGNOSIS — F329 Major depressive disorder, single episode, unspecified: Secondary | ICD-10-CM | POA: Diagnosis not present

## 2013-03-07 ENCOUNTER — Other Ambulatory Visit: Payer: Self-pay

## 2013-03-07 ENCOUNTER — Emergency Department (HOSPITAL_COMMUNITY): Payer: Medicare Other

## 2013-03-07 ENCOUNTER — Encounter (HOSPITAL_COMMUNITY): Payer: Self-pay

## 2013-03-07 ENCOUNTER — Emergency Department (HOSPITAL_COMMUNITY)
Admission: EM | Admit: 2013-03-07 | Discharge: 2013-03-07 | Disposition: A | Discharge status: Home / Self Care | Payer: Medicare Other | Attending: Emergency Medicine | Admitting: Emergency Medicine

## 2013-03-07 DIAGNOSIS — Z79899 Other long term (current) drug therapy: Secondary | ICD-10-CM | POA: Insufficient documentation

## 2013-03-07 DIAGNOSIS — H538 Other visual disturbances: Secondary | ICD-10-CM | POA: Insufficient documentation

## 2013-03-07 DIAGNOSIS — K219 Gastro-esophageal reflux disease without esophagitis: Secondary | ICD-10-CM | POA: Diagnosis not present

## 2013-03-07 DIAGNOSIS — I2510 Atherosclerotic heart disease of native coronary artery without angina pectoris: Secondary | ICD-10-CM | POA: Diagnosis not present

## 2013-03-07 DIAGNOSIS — E119 Type 2 diabetes mellitus without complications: Secondary | ICD-10-CM | POA: Diagnosis not present

## 2013-03-07 DIAGNOSIS — R5381 Other malaise: Secondary | ICD-10-CM | POA: Diagnosis not present

## 2013-03-07 DIAGNOSIS — Z8739 Personal history of other diseases of the musculoskeletal system and connective tissue: Secondary | ICD-10-CM | POA: Insufficient documentation

## 2013-03-07 DIAGNOSIS — Z8711 Personal history of peptic ulcer disease: Secondary | ICD-10-CM | POA: Diagnosis not present

## 2013-03-07 DIAGNOSIS — Z7902 Long term (current) use of antithrombotics/antiplatelets: Secondary | ICD-10-CM | POA: Diagnosis not present

## 2013-03-07 DIAGNOSIS — Z951 Presence of aortocoronary bypass graft: Secondary | ICD-10-CM | POA: Diagnosis not present

## 2013-03-07 DIAGNOSIS — E039 Hypothyroidism, unspecified: Secondary | ICD-10-CM | POA: Insufficient documentation

## 2013-03-07 DIAGNOSIS — R404 Transient alteration of awareness: Secondary | ICD-10-CM | POA: Diagnosis not present

## 2013-03-07 DIAGNOSIS — R531 Weakness: Secondary | ICD-10-CM

## 2013-03-07 DIAGNOSIS — I10 Essential (primary) hypertension: Secondary | ICD-10-CM | POA: Diagnosis not present

## 2013-03-07 DIAGNOSIS — E7800 Pure hypercholesterolemia, unspecified: Secondary | ICD-10-CM | POA: Diagnosis not present

## 2013-03-07 DIAGNOSIS — Z794 Long term (current) use of insulin: Secondary | ICD-10-CM | POA: Diagnosis not present

## 2013-03-07 DIAGNOSIS — R4789 Other speech disturbances: Secondary | ICD-10-CM | POA: Insufficient documentation

## 2013-03-07 DIAGNOSIS — I6350 Cerebral infarction due to unspecified occlusion or stenosis of unspecified cerebral artery: Secondary | ICD-10-CM | POA: Diagnosis not present

## 2013-03-07 LAB — CBC WITH DIFFERENTIAL/PLATELET
Eosinophils Absolute: 0.3 10*3/uL (ref 0.0–0.7)
Eosinophils Relative: 3 % (ref 0–5)
HCT: 41.1 % (ref 36.0–46.0)
Hemoglobin: 13.5 g/dL (ref 12.0–15.0)
Lymphs Abs: 2.2 10*3/uL (ref 0.7–4.0)
MCH: 28.7 pg (ref 26.0–34.0)
MCHC: 32.8 g/dL (ref 30.0–36.0)
MCV: 87.3 fL (ref 78.0–100.0)
Monocytes Absolute: 0.6 10*3/uL (ref 0.1–1.0)
Monocytes Relative: 6 % (ref 3–12)
Neutrophils Relative %: 69 % (ref 43–77)
RBC: 4.71 MIL/uL (ref 3.87–5.11)

## 2013-03-07 LAB — URINALYSIS, ROUTINE W REFLEX MICROSCOPIC
Ketones, ur: NEGATIVE mg/dL
Leukocytes, UA: NEGATIVE
Nitrite: NEGATIVE
Protein, ur: NEGATIVE mg/dL
pH: 6 (ref 5.0–8.0)

## 2013-03-07 LAB — COMPREHENSIVE METABOLIC PANEL
Alkaline Phosphatase: 64 U/L (ref 39–117)
BUN: 18 mg/dL (ref 6–23)
Creatinine, Ser: 0.89 mg/dL (ref 0.50–1.10)
GFR calc Af Amer: 67 mL/min — ABNORMAL LOW (ref >90)
Glucose, Bld: 176 mg/dL — ABNORMAL HIGH (ref 70–99)
Potassium: 4.1 mEq/L (ref 3.5–5.1)
Total Protein: 6.5 g/dL (ref 6.0–8.3)

## 2013-03-07 LAB — URINE MICROSCOPIC-ADD ON

## 2013-03-07 MED ORDER — BACITRACIN ZINC 500 UNIT/GM EX OINT
TOPICAL_OINTMENT | CUTANEOUS | Status: AC
  Administered 2013-03-07: 21:00:00
  Filled 2013-03-07: qty 0.9

## 2013-03-07 NOTE — ED Provider Notes (Signed)
CSN: [id number]     Arrival date & time 03/07/13  1734 History  This chart was scribed for Maudry Diego, MD by Gertie Exon, ED Scribe. This patient was seen in room APA11/APA11 and the patient's care was started at 5:45 PM.   Chief Complaint  Patient presents with  . Dizziness   Patient is a 79 y.o. female presenting with weakness. The history is provided by the patient. No language interpreter was used.  Weakness This is a recurrent problem. The current episode started more than 1 week ago. The problem occurs constantly. The problem has been gradually worsening. Pertinent negatives include no chest pain, no abdominal pain and no headaches. The symptoms are aggravated by exertion. Nothing relieves the symptoms. She has tried nothing for the symptoms.   HPI Comments: [name] is a 79 y.o. female who presents to the Emergency Department of complaining of dizziness, double vision, dry mouth and worsening beyond baseline of her right-sided weakness form a previous stroke, affecting her right side. Pt is from [place] and nurse states she has slurred speech at baseline which has worsened recently. She states she has a history of stroke, and current symptoms feel similar to her. She denies any other symptoms.  PCP- Jani Gravel   Past Medical History  Diagnosis Date  . Pure hypercholesterolemia   . Type II or unspecified type diabetes mellitus without mention of complication, not stated as uncontrolled   . Unspecified essential hypertension   . Unspecified transient cerebral ischemia   . Coronary atherosclerosis of native coronary artery     four-vessel CABG, 7/06, ... normal Cardiolite; EF 79%, 8/10  . PUD (peptic ulcer disease)   . Osteoarthritis   . GERD (gastroesophageal reflux disease)   . Hypothyroidism 06/14/2009  . Arthritis   . GIB (gastrointestinal bleeding)     history of  . Stromal tumor of the stomach     history of. partial gastrectomy 2001   Past Surgical History   Procedure Laterality Date  . Coronary artery bypass graft      . four-vessel CABG, 7/06, ... normal Cardiolite; EF 79%, 8/10  . Partial thyroid surgery    . Cataract extraction    . Partial gastrectomy  [facility]  . Abdominal hysterectomy    . Gallbladder resection     Family History  Problem Relation Age of Onset  . Heart attack Brother 34  . Coronary artery disease    . Hypertension     History  Substance Use Topics  . Smoking status: Never Smoker   . Smokeless tobacco: Never Used  . Alcohol Use: No   OB History   Grav Para Term Preterm Abortions TAB SAB Ect Mult Living                 Review of Systems  Constitutional: Negative for appetite change and fatigue.  HENT: Negative for congestion, sinus pressure and ear discharge.   Eyes: Positive for visual disturbance (double vision). Negative for discharge.  Respiratory: Negative for cough.   Cardiovascular: Negative for chest pain.  Gastrointestinal: Negative for abdominal pain and diarrhea.  Genitourinary: Negative for frequency and hematuria.  Musculoskeletal: Negative for back pain.  Skin: Negative for rash.  Neurological: Positive for dizziness, speech difficulty and weakness. Negative for seizures and headaches.  Psychiatric/Behavioral: Negative for hallucinations.  All other systems reviewed and are negative.    Allergies  Codeine; Contrast media; and Sulfa antibiotics  Home Medications  Current Outpatient Rx  Name  Route  Sig  Dispense  Refill  . acetaminophen (TYLENOL) 325 MG tablet   Oral   Take 325 mg by mouth every 8 (eight) hours as needed. For pain         . amLODipine (NORVASC) 5 MG tablet   Oral   Take 5 mg by mouth every morning.         . calcium carbonate (OS-CAL - DOSED IN MG OF ELEMENTAL CALCIUM) 1250 MG tablet   Oral   Take 1 tablet by mouth 2 (two) times daily.           . clopidogrel (PLAVIX) 75 MG tablet   Oral   Take 75 mg by mouth daily.           Marland Kitchen  FLUoxetine (PROZAC) 40 MG capsule   Oral   Take 40 mg by mouth daily.         . insulin glargine (LANTUS) 100 UNIT/ML injection   Subcutaneous   Inject 23 Units into the skin at bedtime.         . insulin regular (NOVOLIN R,HUMULIN R) 100 units/mL injection   Subcutaneous   Inject into the skin as directed. Per sliding scale instructions         . levothyroxine (SYNTHROID, LEVOTHROID) 88 MCG tablet   Oral   Take 88 mcg by mouth daily.          Marland Kitchen lisinopril (PRINIVIL,ZESTRIL) 10 MG tablet   Oral   Take 10 mg by mouth daily.          . meclizine (ANTIVERT) 25 MG tablet      One PO TID x 5 days   15 tablet   0   . Multiple Vitamins-Minerals (CENTRUM SILVER PO)   Oral   Take 1 tablet by mouth daily.          . nitroGLYCERIN (NITROSTAT) 0.4 MG SL tablet   Sublingual   Place 1 tablet (0.4 mg total) under the tongue every 5 (five) minutes as needed.   25 tablet   3   . polyethylene glycol powder (GLYCOLAX/MIRALAX) powder   Oral   Take 17 g by mouth daily.         . ranitidine (ZANTAC) 150 MG capsule   Oral   Take 150 mg by mouth 2 (two) times daily.           . simvastatin (ZOCOR) 20 MG tablet   Oral   Take 20 mg by mouth at bedtime.           . Wheat Dextrin (BENEFIBER) POWD   Oral   Take 5 mLs by mouth daily.          Triage Vitals: BP 151/73  Pulse 82  Temp(Src) 98 F (36.7 C) (Oral)  Resp 18  SpO2 95%  Physical Exam  Nursing note and vitals reviewed. Constitutional: She is oriented to person, place, and time. She appears well-developed.  Slurred speech.  HENT:  Head: Normocephalic.  Eyes: Conjunctivae and EOM are normal. No scleral icterus.  Left pupil is not reactive.  Neck: Neck supple. No thyromegaly present.  Cardiovascular: Normal rate and regular rhythm.  Exam reveals no gallop and no friction rub.   No murmur heard. Pulmonary/Chest: No stridor. She has no wheezes. She has no rales. She exhibits no tenderness.  Abdominal:  She exhibits no distension. There is no tenderness. There is no rebound.  Musculoskeletal: Normal range of  motion. She exhibits no edema.  Lymphadenopathy:    She has no cervical adenopathy.  Neurological: She is oriented to person, place, and time. She exhibits normal muscle tone. Coordination normal.  Moderate weakness in the right arm and right leg.  Skin: No rash noted. No erythema.  Psychiatric: She has a normal mood and affect. Her behavior is normal.    ED Course  Procedures (including critical care time)  DIAGNOSTIC STUDIES: Oxygen Saturation is 95% on RA, adequate by my interpretation.    COORDINATION OF CARE: 5:54 PM- Pt advised of plan for treatment and pt agrees.  Labs Review Labs Reviewed  GLUCOSE, CAPILLARY - Abnormal; Notable for the following:    Glucose-Capillary 178 (*)    All other components within normal limits  COMPREHENSIVE METABOLIC PANEL - Abnormal; Notable for the following:    Glucose, Bld 176 (*)    Albumin 3.3 (*)    Total Bilirubin 0.2 (*)    GFR calc non Af Amer 58 (*)    GFR calc Af Amer 67 (*)    All other components within normal limits  URINALYSIS, ROUTINE W REFLEX MICROSCOPIC - Abnormal; Notable for the following:    Hgb urine dipstick SMALL (*)    All other components within normal limits  URINE MICROSCOPIC-ADD ON - Abnormal; Notable for the following:    Bacteria, UA FEW (*)    All other components within normal limits  CBC WITH DIFFERENTIAL   Imaging Review Ct Head Wo Contrast  03/07/2013   CLINICAL DATA:  Dizziness  EXAM: CT HEAD WITHOUT CONTRAST  TECHNIQUE: Contiguous axial images were obtained from the base of the skull through the vertex without intravenous contrast. Study was obtained within 24 hr of patient's arrival at the emergency department.  COMPARISON:  January 07, 2012 and July 29, 2011  FINDINGS: There is mild diffuse atrophy. There is no mass, hemorrhage, extra-axial fluid collection, or midline shift.  There is somewhat  ill-defined decreased attenuation in the medial right occipital lobe which is suspicious for acute infarct. There is a 2nd area suspicious for acute infarct in the posterior inferior to mid right cerebellum. Medial to this area of acute appearing infarct in the right cerebellum, there is an older infarct in the anterior mid right cerebellum.  There are small prior infarcts in both thalami. There is small vessel disease throughout the centra semiovale with several focal white matter infarcts in the centra semiovale bilaterally which are stable.  Bony calvarium appears intact. Mastoid air cells are clear.  IMPRESSION: Areas suspicious for acute infarction in the right posterior inferior to mid cerebellum in the right posterior inferior cerebellar artery distribution and in the right medial occipital lobe in the distribution of the calcarine branch of the right posterior cerebral artery. There is underlying atrophy with extensive small vessel disease and multiple prior infarcts as noted above. There is no hemorrhage or evidence suggesting mass.   Electronically Signed   By: Lowella Grip   On: 03/07/2013 18:30   Mr Brain Wo Contrast  03/07/2013   CLINICAL DATA:  New onset dizziness. Prior history of stomach cancer.  EXAM: MRI HEAD WITHOUT CONTRAST  TECHNIQUE: Multiplanar, multisequence MR imaging was performed. No intravenous contrast was administered.  COMPARISON:  CT head without contrast 03/07/2013. MRI brain 07/11/2010.  FINDINGS: An interval hemorrhagic infarct of the right posterior inferior cerebellum and is not acute. Remote blood products are evident. Punctate areas of susceptibility compatible with remote hemorrhagic and lacunar infarcts are evident  in the basal ganglia bilaterally, right greater than left. Additional nonhemorrhagic remote lacunar infarcts of the basal ganglia are also stable. No acute infarct or hemorrhage is present. Extensive periventricular and subcortical white matter disease is  otherwise stable.  Flow is present in the major intracranial arteries. The patient is status post bilateral lens extractions. The paranasal sinuses and mastoid air cells are clear.  IMPRESSION: 1. Interval hemorrhagic infarct of the inferior right cerebellum is not acute. 2. Multiple remote punctate areas of susceptibility are compatible with remote hemorrhagic infarcts. This does raise the question of amyloid angiopathy. 3. Additional remote lacunar infarcts of the basal ganglia bilaterally are nonhemorrhagic. 4. Stable atrophy and diffuse white matter disease.   Electronically Signed   By: Lawrence Santiago   On: 03/07/2013 20:03   Dg Chest Port 1 View  03/07/2013   CLINICAL DATA:  Weakness  EXAM: PORTABLE CHEST - 1 VIEW  COMPARISON:  July 29, 2011  FINDINGS: There is mild scarring in the left base. The lungs are otherwise clear. Heart size and pulmonary vascularity are normal. There is atherosclerotic change in the aorta. Patient is status post coronary artery bypass grafting. No apparent adenopathy. There is arthropathy in the right shoulder.  IMPRESSION: No edema or consolidation.   Electronically Signed   By: Lowella Grip   On: 03/07/2013 18:20    MDM  No diagnosis found. Normal mri,  Neurology recommend follow up as out pt The chart was scribed for me under my direct supervision.  I personally performed the history, physical, and medical decision making and all procedures in the evaluation of this patient.Maudry Diego, MD 03/07/13 2133

## 2013-03-07 NOTE — ED Notes (Signed)
Per ems, staff at [facility] reports pt has been dizzy today and complaining of numbness to the rt side of her face.  Pt's left pupil appears larger than the rt upon arrival.  Pt has hx of demential.  Staff reports that they have not noticed any AMS today.

## 2013-03-08 DIAGNOSIS — F068 Other specified mental disorders due to known physiological condition: Secondary | ICD-10-CM | POA: Diagnosis not present

## 2013-03-08 DIAGNOSIS — I6350 Cerebral infarction due to unspecified occlusion or stenosis of unspecified cerebral artery: Secondary | ICD-10-CM | POA: Diagnosis not present

## 2013-03-08 DIAGNOSIS — N390 Urinary tract infection, site not specified: Secondary | ICD-10-CM | POA: Diagnosis not present

## 2013-03-08 DIAGNOSIS — R5381 Other malaise: Secondary | ICD-10-CM | POA: Diagnosis not present

## 2013-03-09 DIAGNOSIS — I6350 Cerebral infarction due to unspecified occlusion or stenosis of unspecified cerebral artery: Secondary | ICD-10-CM | POA: Diagnosis not present

## 2013-03-09 DIAGNOSIS — F068 Other specified mental disorders due to known physiological condition: Secondary | ICD-10-CM | POA: Diagnosis not present

## 2013-03-09 DIAGNOSIS — R5381 Other malaise: Secondary | ICD-10-CM | POA: Diagnosis not present

## 2013-03-09 DIAGNOSIS — N390 Urinary tract infection, site not specified: Secondary | ICD-10-CM | POA: Diagnosis not present

## 2013-03-12 DIAGNOSIS — H1030 Unspecified acute conjunctivitis, unspecified eye: Secondary | ICD-10-CM | POA: Diagnosis not present

## 2013-03-12 DIAGNOSIS — B373 Candidiasis of vulva and vagina: Secondary | ICD-10-CM | POA: Diagnosis not present

## 2013-03-16 DIAGNOSIS — B373 Candidiasis of vulva and vagina: Secondary | ICD-10-CM | POA: Diagnosis not present

## 2013-03-16 DIAGNOSIS — K5900 Constipation, unspecified: Secondary | ICD-10-CM | POA: Diagnosis not present

## 2013-03-16 DIAGNOSIS — H1030 Unspecified acute conjunctivitis, unspecified eye: Secondary | ICD-10-CM | POA: Diagnosis not present

## 2013-03-22 DIAGNOSIS — R21 Rash and other nonspecific skin eruption: Secondary | ICD-10-CM | POA: Diagnosis not present

## 2013-03-22 DIAGNOSIS — L270 Generalized skin eruption due to drugs and medicaments taken internally: Secondary | ICD-10-CM | POA: Diagnosis not present

## 2013-03-23 DIAGNOSIS — L270 Generalized skin eruption due to drugs and medicaments taken internally: Secondary | ICD-10-CM | POA: Diagnosis not present

## 2013-03-23 DIAGNOSIS — R21 Rash and other nonspecific skin eruption: Secondary | ICD-10-CM | POA: Diagnosis not present

## 2013-03-26 DIAGNOSIS — L270 Generalized skin eruption due to drugs and medicaments taken internally: Secondary | ICD-10-CM | POA: Diagnosis not present

## 2013-03-26 DIAGNOSIS — R21 Rash and other nonspecific skin eruption: Secondary | ICD-10-CM | POA: Diagnosis not present

## 2013-03-28 DIAGNOSIS — I619 Nontraumatic intracerebral hemorrhage, unspecified: Secondary | ICD-10-CM | POA: Diagnosis not present

## 2013-03-28 DIAGNOSIS — R269 Unspecified abnormalities of gait and mobility: Secondary | ICD-10-CM | POA: Diagnosis not present

## 2013-03-28 DIAGNOSIS — R279 Unspecified lack of coordination: Secondary | ICD-10-CM | POA: Diagnosis not present

## 2013-03-28 DIAGNOSIS — M6281 Muscle weakness (generalized): Secondary | ICD-10-CM | POA: Diagnosis not present

## 2013-03-28 DIAGNOSIS — R293 Abnormal posture: Secondary | ICD-10-CM | POA: Diagnosis not present

## 2013-03-29 DIAGNOSIS — M6281 Muscle weakness (generalized): Secondary | ICD-10-CM | POA: Diagnosis not present

## 2013-03-29 DIAGNOSIS — R269 Unspecified abnormalities of gait and mobility: Secondary | ICD-10-CM | POA: Diagnosis not present

## 2013-03-29 DIAGNOSIS — R279 Unspecified lack of coordination: Secondary | ICD-10-CM | POA: Diagnosis not present

## 2013-03-29 DIAGNOSIS — R293 Abnormal posture: Secondary | ICD-10-CM | POA: Diagnosis not present

## 2013-03-29 DIAGNOSIS — I619 Nontraumatic intracerebral hemorrhage, unspecified: Secondary | ICD-10-CM | POA: Diagnosis not present

## 2013-03-30 DIAGNOSIS — R269 Unspecified abnormalities of gait and mobility: Secondary | ICD-10-CM | POA: Diagnosis not present

## 2013-03-30 DIAGNOSIS — I619 Nontraumatic intracerebral hemorrhage, unspecified: Secondary | ICD-10-CM | POA: Diagnosis not present

## 2013-03-30 DIAGNOSIS — R293 Abnormal posture: Secondary | ICD-10-CM | POA: Diagnosis not present

## 2013-03-30 DIAGNOSIS — R279 Unspecified lack of coordination: Secondary | ICD-10-CM | POA: Diagnosis not present

## 2013-03-30 DIAGNOSIS — M6281 Muscle weakness (generalized): Secondary | ICD-10-CM | POA: Diagnosis not present

## 2013-04-01 DIAGNOSIS — R293 Abnormal posture: Secondary | ICD-10-CM | POA: Diagnosis not present

## 2013-04-01 DIAGNOSIS — I619 Nontraumatic intracerebral hemorrhage, unspecified: Secondary | ICD-10-CM | POA: Diagnosis not present

## 2013-04-01 DIAGNOSIS — M6281 Muscle weakness (generalized): Secondary | ICD-10-CM | POA: Diagnosis not present

## 2013-04-01 DIAGNOSIS — R269 Unspecified abnormalities of gait and mobility: Secondary | ICD-10-CM | POA: Diagnosis not present

## 2013-04-01 DIAGNOSIS — F329 Major depressive disorder, single episode, unspecified: Secondary | ICD-10-CM | POA: Diagnosis not present

## 2013-04-01 DIAGNOSIS — R279 Unspecified lack of coordination: Secondary | ICD-10-CM | POA: Diagnosis not present

## 2013-04-01 DIAGNOSIS — F29 Unspecified psychosis not due to a substance or known physiological condition: Secondary | ICD-10-CM | POA: Diagnosis not present

## 2013-04-02 DIAGNOSIS — R269 Unspecified abnormalities of gait and mobility: Secondary | ICD-10-CM | POA: Diagnosis not present

## 2013-04-02 DIAGNOSIS — M6281 Muscle weakness (generalized): Secondary | ICD-10-CM | POA: Diagnosis not present

## 2013-04-02 DIAGNOSIS — I619 Nontraumatic intracerebral hemorrhage, unspecified: Secondary | ICD-10-CM | POA: Diagnosis not present

## 2013-04-02 DIAGNOSIS — R279 Unspecified lack of coordination: Secondary | ICD-10-CM | POA: Diagnosis not present

## 2013-04-02 DIAGNOSIS — R293 Abnormal posture: Secondary | ICD-10-CM | POA: Diagnosis not present

## 2013-04-03 DIAGNOSIS — I619 Nontraumatic intracerebral hemorrhage, unspecified: Secondary | ICD-10-CM | POA: Diagnosis not present

## 2013-04-03 DIAGNOSIS — R293 Abnormal posture: Secondary | ICD-10-CM | POA: Diagnosis not present

## 2013-04-03 DIAGNOSIS — R279 Unspecified lack of coordination: Secondary | ICD-10-CM | POA: Diagnosis not present

## 2013-04-03 DIAGNOSIS — M6281 Muscle weakness (generalized): Secondary | ICD-10-CM | POA: Diagnosis not present

## 2013-04-03 DIAGNOSIS — R269 Unspecified abnormalities of gait and mobility: Secondary | ICD-10-CM | POA: Diagnosis not present

## 2013-04-04 DIAGNOSIS — R293 Abnormal posture: Secondary | ICD-10-CM | POA: Diagnosis not present

## 2013-04-04 DIAGNOSIS — R279 Unspecified lack of coordination: Secondary | ICD-10-CM | POA: Diagnosis not present

## 2013-04-04 DIAGNOSIS — R269 Unspecified abnormalities of gait and mobility: Secondary | ICD-10-CM | POA: Diagnosis not present

## 2013-04-04 DIAGNOSIS — I619 Nontraumatic intracerebral hemorrhage, unspecified: Secondary | ICD-10-CM | POA: Diagnosis not present

## 2013-04-04 DIAGNOSIS — M6281 Muscle weakness (generalized): Secondary | ICD-10-CM | POA: Diagnosis not present

## 2013-04-05 DIAGNOSIS — R279 Unspecified lack of coordination: Secondary | ICD-10-CM | POA: Diagnosis not present

## 2013-04-05 DIAGNOSIS — R293 Abnormal posture: Secondary | ICD-10-CM | POA: Diagnosis not present

## 2013-04-05 DIAGNOSIS — R269 Unspecified abnormalities of gait and mobility: Secondary | ICD-10-CM | POA: Diagnosis not present

## 2013-04-05 DIAGNOSIS — M6281 Muscle weakness (generalized): Secondary | ICD-10-CM | POA: Diagnosis not present

## 2013-04-05 DIAGNOSIS — I619 Nontraumatic intracerebral hemorrhage, unspecified: Secondary | ICD-10-CM | POA: Diagnosis not present

## 2013-04-06 DIAGNOSIS — I619 Nontraumatic intracerebral hemorrhage, unspecified: Secondary | ICD-10-CM | POA: Diagnosis not present

## 2013-04-06 DIAGNOSIS — R269 Unspecified abnormalities of gait and mobility: Secondary | ICD-10-CM | POA: Diagnosis not present

## 2013-04-06 DIAGNOSIS — R279 Unspecified lack of coordination: Secondary | ICD-10-CM | POA: Diagnosis not present

## 2013-04-06 DIAGNOSIS — R293 Abnormal posture: Secondary | ICD-10-CM | POA: Diagnosis not present

## 2013-04-06 DIAGNOSIS — M6281 Muscle weakness (generalized): Secondary | ICD-10-CM | POA: Diagnosis not present

## 2013-04-07 DIAGNOSIS — R293 Abnormal posture: Secondary | ICD-10-CM | POA: Diagnosis not present

## 2013-04-07 DIAGNOSIS — I619 Nontraumatic intracerebral hemorrhage, unspecified: Secondary | ICD-10-CM | POA: Diagnosis not present

## 2013-04-07 DIAGNOSIS — R269 Unspecified abnormalities of gait and mobility: Secondary | ICD-10-CM | POA: Diagnosis not present

## 2013-04-07 DIAGNOSIS — M6281 Muscle weakness (generalized): Secondary | ICD-10-CM | POA: Diagnosis not present

## 2013-04-07 DIAGNOSIS — R279 Unspecified lack of coordination: Secondary | ICD-10-CM | POA: Diagnosis not present

## 2013-04-09 DIAGNOSIS — R293 Abnormal posture: Secondary | ICD-10-CM | POA: Diagnosis not present

## 2013-04-09 DIAGNOSIS — M6281 Muscle weakness (generalized): Secondary | ICD-10-CM | POA: Diagnosis not present

## 2013-04-09 DIAGNOSIS — R279 Unspecified lack of coordination: Secondary | ICD-10-CM | POA: Diagnosis not present

## 2013-04-09 DIAGNOSIS — R269 Unspecified abnormalities of gait and mobility: Secondary | ICD-10-CM | POA: Diagnosis not present

## 2013-04-09 DIAGNOSIS — I619 Nontraumatic intracerebral hemorrhage, unspecified: Secondary | ICD-10-CM | POA: Diagnosis not present

## 2013-04-10 DIAGNOSIS — R293 Abnormal posture: Secondary | ICD-10-CM | POA: Diagnosis not present

## 2013-04-10 DIAGNOSIS — I619 Nontraumatic intracerebral hemorrhage, unspecified: Secondary | ICD-10-CM | POA: Diagnosis not present

## 2013-04-10 DIAGNOSIS — R279 Unspecified lack of coordination: Secondary | ICD-10-CM | POA: Diagnosis not present

## 2013-04-10 DIAGNOSIS — R269 Unspecified abnormalities of gait and mobility: Secondary | ICD-10-CM | POA: Diagnosis not present

## 2013-04-10 DIAGNOSIS — E039 Hypothyroidism, unspecified: Secondary | ICD-10-CM | POA: Diagnosis not present

## 2013-04-10 DIAGNOSIS — M6281 Muscle weakness (generalized): Secondary | ICD-10-CM | POA: Diagnosis not present

## 2013-04-11 DIAGNOSIS — R279 Unspecified lack of coordination: Secondary | ICD-10-CM | POA: Diagnosis not present

## 2013-04-11 DIAGNOSIS — M6281 Muscle weakness (generalized): Secondary | ICD-10-CM | POA: Diagnosis not present

## 2013-04-11 DIAGNOSIS — R269 Unspecified abnormalities of gait and mobility: Secondary | ICD-10-CM | POA: Diagnosis not present

## 2013-04-11 DIAGNOSIS — I619 Nontraumatic intracerebral hemorrhage, unspecified: Secondary | ICD-10-CM | POA: Diagnosis not present

## 2013-04-11 DIAGNOSIS — R293 Abnormal posture: Secondary | ICD-10-CM | POA: Diagnosis not present

## 2013-04-12 DIAGNOSIS — I619 Nontraumatic intracerebral hemorrhage, unspecified: Secondary | ICD-10-CM | POA: Diagnosis not present

## 2013-04-12 DIAGNOSIS — R269 Unspecified abnormalities of gait and mobility: Secondary | ICD-10-CM | POA: Diagnosis not present

## 2013-04-12 DIAGNOSIS — R279 Unspecified lack of coordination: Secondary | ICD-10-CM | POA: Diagnosis not present

## 2013-04-12 DIAGNOSIS — M6281 Muscle weakness (generalized): Secondary | ICD-10-CM | POA: Diagnosis not present

## 2013-04-12 DIAGNOSIS — R293 Abnormal posture: Secondary | ICD-10-CM | POA: Diagnosis not present

## 2013-04-13 DIAGNOSIS — R279 Unspecified lack of coordination: Secondary | ICD-10-CM | POA: Diagnosis not present

## 2013-04-13 DIAGNOSIS — I619 Nontraumatic intracerebral hemorrhage, unspecified: Secondary | ICD-10-CM | POA: Diagnosis not present

## 2013-04-13 DIAGNOSIS — R293 Abnormal posture: Secondary | ICD-10-CM | POA: Diagnosis not present

## 2013-04-13 DIAGNOSIS — M6281 Muscle weakness (generalized): Secondary | ICD-10-CM | POA: Diagnosis not present

## 2013-04-13 DIAGNOSIS — R269 Unspecified abnormalities of gait and mobility: Secondary | ICD-10-CM | POA: Diagnosis not present

## 2013-04-14 DIAGNOSIS — R269 Unspecified abnormalities of gait and mobility: Secondary | ICD-10-CM | POA: Diagnosis not present

## 2013-04-14 DIAGNOSIS — M6281 Muscle weakness (generalized): Secondary | ICD-10-CM | POA: Diagnosis not present

## 2013-04-14 DIAGNOSIS — R279 Unspecified lack of coordination: Secondary | ICD-10-CM | POA: Diagnosis not present

## 2013-04-14 DIAGNOSIS — R293 Abnormal posture: Secondary | ICD-10-CM | POA: Diagnosis not present

## 2013-04-14 DIAGNOSIS — I619 Nontraumatic intracerebral hemorrhage, unspecified: Secondary | ICD-10-CM | POA: Diagnosis not present

## 2013-04-16 DIAGNOSIS — I619 Nontraumatic intracerebral hemorrhage, unspecified: Secondary | ICD-10-CM | POA: Diagnosis not present

## 2013-04-16 DIAGNOSIS — R279 Unspecified lack of coordination: Secondary | ICD-10-CM | POA: Diagnosis not present

## 2013-04-16 DIAGNOSIS — R293 Abnormal posture: Secondary | ICD-10-CM | POA: Diagnosis not present

## 2013-04-16 DIAGNOSIS — L851 Acquired keratosis [keratoderma] palmaris et plantaris: Secondary | ICD-10-CM | POA: Diagnosis not present

## 2013-04-16 DIAGNOSIS — M6281 Muscle weakness (generalized): Secondary | ICD-10-CM | POA: Diagnosis not present

## 2013-04-16 DIAGNOSIS — R269 Unspecified abnormalities of gait and mobility: Secondary | ICD-10-CM | POA: Diagnosis not present

## 2013-04-16 DIAGNOSIS — R21 Rash and other nonspecific skin eruption: Secondary | ICD-10-CM | POA: Diagnosis not present

## 2013-04-17 DIAGNOSIS — R269 Unspecified abnormalities of gait and mobility: Secondary | ICD-10-CM | POA: Diagnosis not present

## 2013-04-17 DIAGNOSIS — R21 Rash and other nonspecific skin eruption: Secondary | ICD-10-CM | POA: Diagnosis not present

## 2013-04-17 DIAGNOSIS — R279 Unspecified lack of coordination: Secondary | ICD-10-CM | POA: Diagnosis not present

## 2013-04-17 DIAGNOSIS — L851 Acquired keratosis [keratoderma] palmaris et plantaris: Secondary | ICD-10-CM | POA: Diagnosis not present

## 2013-04-17 DIAGNOSIS — M6281 Muscle weakness (generalized): Secondary | ICD-10-CM | POA: Diagnosis not present

## 2013-04-17 DIAGNOSIS — R293 Abnormal posture: Secondary | ICD-10-CM | POA: Diagnosis not present

## 2013-04-17 DIAGNOSIS — I619 Nontraumatic intracerebral hemorrhage, unspecified: Secondary | ICD-10-CM | POA: Diagnosis not present

## 2013-04-18 DIAGNOSIS — R279 Unspecified lack of coordination: Secondary | ICD-10-CM | POA: Diagnosis not present

## 2013-04-18 DIAGNOSIS — R269 Unspecified abnormalities of gait and mobility: Secondary | ICD-10-CM | POA: Diagnosis not present

## 2013-04-18 DIAGNOSIS — I619 Nontraumatic intracerebral hemorrhage, unspecified: Secondary | ICD-10-CM | POA: Diagnosis not present

## 2013-04-18 DIAGNOSIS — M6281 Muscle weakness (generalized): Secondary | ICD-10-CM | POA: Diagnosis not present

## 2013-04-18 DIAGNOSIS — R293 Abnormal posture: Secondary | ICD-10-CM | POA: Diagnosis not present

## 2013-04-19 DIAGNOSIS — N390 Urinary tract infection, site not specified: Secondary | ICD-10-CM | POA: Diagnosis not present

## 2013-04-19 DIAGNOSIS — M6281 Muscle weakness (generalized): Secondary | ICD-10-CM | POA: Diagnosis not present

## 2013-04-19 DIAGNOSIS — R279 Unspecified lack of coordination: Secondary | ICD-10-CM | POA: Diagnosis not present

## 2013-04-19 DIAGNOSIS — I619 Nontraumatic intracerebral hemorrhage, unspecified: Secondary | ICD-10-CM | POA: Diagnosis not present

## 2013-04-19 DIAGNOSIS — Z79899 Other long term (current) drug therapy: Secondary | ICD-10-CM | POA: Diagnosis not present

## 2013-04-19 DIAGNOSIS — R293 Abnormal posture: Secondary | ICD-10-CM | POA: Diagnosis not present

## 2013-04-19 DIAGNOSIS — R269 Unspecified abnormalities of gait and mobility: Secondary | ICD-10-CM | POA: Diagnosis not present

## 2013-04-20 DIAGNOSIS — R293 Abnormal posture: Secondary | ICD-10-CM | POA: Diagnosis not present

## 2013-04-20 DIAGNOSIS — M6281 Muscle weakness (generalized): Secondary | ICD-10-CM | POA: Diagnosis not present

## 2013-04-20 DIAGNOSIS — R269 Unspecified abnormalities of gait and mobility: Secondary | ICD-10-CM | POA: Diagnosis not present

## 2013-04-20 DIAGNOSIS — R279 Unspecified lack of coordination: Secondary | ICD-10-CM | POA: Diagnosis not present

## 2013-04-20 DIAGNOSIS — I619 Nontraumatic intracerebral hemorrhage, unspecified: Secondary | ICD-10-CM | POA: Diagnosis not present

## 2013-04-23 DIAGNOSIS — I619 Nontraumatic intracerebral hemorrhage, unspecified: Secondary | ICD-10-CM | POA: Diagnosis not present

## 2013-04-23 DIAGNOSIS — R269 Unspecified abnormalities of gait and mobility: Secondary | ICD-10-CM | POA: Diagnosis not present

## 2013-04-23 DIAGNOSIS — N390 Urinary tract infection, site not specified: Secondary | ICD-10-CM | POA: Diagnosis not present

## 2013-04-23 DIAGNOSIS — R293 Abnormal posture: Secondary | ICD-10-CM | POA: Diagnosis not present

## 2013-04-23 DIAGNOSIS — R21 Rash and other nonspecific skin eruption: Secondary | ICD-10-CM | POA: Diagnosis not present

## 2013-04-23 DIAGNOSIS — M6281 Muscle weakness (generalized): Secondary | ICD-10-CM | POA: Diagnosis not present

## 2013-04-23 DIAGNOSIS — R279 Unspecified lack of coordination: Secondary | ICD-10-CM | POA: Diagnosis not present

## 2013-04-24 DIAGNOSIS — R269 Unspecified abnormalities of gait and mobility: Secondary | ICD-10-CM | POA: Diagnosis not present

## 2013-04-24 DIAGNOSIS — R279 Unspecified lack of coordination: Secondary | ICD-10-CM | POA: Diagnosis not present

## 2013-04-24 DIAGNOSIS — M6281 Muscle weakness (generalized): Secondary | ICD-10-CM | POA: Diagnosis not present

## 2013-04-24 DIAGNOSIS — I619 Nontraumatic intracerebral hemorrhage, unspecified: Secondary | ICD-10-CM | POA: Diagnosis not present

## 2013-04-24 DIAGNOSIS — R293 Abnormal posture: Secondary | ICD-10-CM | POA: Diagnosis not present

## 2013-04-25 DIAGNOSIS — R269 Unspecified abnormalities of gait and mobility: Secondary | ICD-10-CM | POA: Diagnosis not present

## 2013-04-25 DIAGNOSIS — M6281 Muscle weakness (generalized): Secondary | ICD-10-CM | POA: Diagnosis not present

## 2013-04-25 DIAGNOSIS — I619 Nontraumatic intracerebral hemorrhage, unspecified: Secondary | ICD-10-CM | POA: Diagnosis not present

## 2013-04-25 DIAGNOSIS — R293 Abnormal posture: Secondary | ICD-10-CM | POA: Diagnosis not present

## 2013-04-25 DIAGNOSIS — R279 Unspecified lack of coordination: Secondary | ICD-10-CM | POA: Diagnosis not present

## 2013-04-26 DIAGNOSIS — R269 Unspecified abnormalities of gait and mobility: Secondary | ICD-10-CM | POA: Diagnosis not present

## 2013-04-26 DIAGNOSIS — R279 Unspecified lack of coordination: Secondary | ICD-10-CM | POA: Diagnosis not present

## 2013-04-26 DIAGNOSIS — I619 Nontraumatic intracerebral hemorrhage, unspecified: Secondary | ICD-10-CM | POA: Diagnosis not present

## 2013-04-26 DIAGNOSIS — R293 Abnormal posture: Secondary | ICD-10-CM | POA: Diagnosis not present

## 2013-04-26 DIAGNOSIS — M6281 Muscle weakness (generalized): Secondary | ICD-10-CM | POA: Diagnosis not present

## 2013-04-27 DIAGNOSIS — R293 Abnormal posture: Secondary | ICD-10-CM | POA: Diagnosis not present

## 2013-04-27 DIAGNOSIS — R279 Unspecified lack of coordination: Secondary | ICD-10-CM | POA: Diagnosis not present

## 2013-04-27 DIAGNOSIS — R269 Unspecified abnormalities of gait and mobility: Secondary | ICD-10-CM | POA: Diagnosis not present

## 2013-04-27 DIAGNOSIS — I619 Nontraumatic intracerebral hemorrhage, unspecified: Secondary | ICD-10-CM | POA: Diagnosis not present

## 2013-04-27 DIAGNOSIS — M6281 Muscle weakness (generalized): Secondary | ICD-10-CM | POA: Diagnosis not present

## 2013-04-29 DIAGNOSIS — F29 Unspecified psychosis not due to a substance or known physiological condition: Secondary | ICD-10-CM | POA: Diagnosis not present

## 2013-04-29 DIAGNOSIS — F329 Major depressive disorder, single episode, unspecified: Secondary | ICD-10-CM | POA: Diagnosis not present

## 2013-04-30 DIAGNOSIS — M6281 Muscle weakness (generalized): Secondary | ICD-10-CM | POA: Diagnosis not present

## 2013-04-30 DIAGNOSIS — I619 Nontraumatic intracerebral hemorrhage, unspecified: Secondary | ICD-10-CM | POA: Diagnosis not present

## 2013-04-30 DIAGNOSIS — R293 Abnormal posture: Secondary | ICD-10-CM | POA: Diagnosis not present

## 2013-04-30 DIAGNOSIS — R269 Unspecified abnormalities of gait and mobility: Secondary | ICD-10-CM | POA: Diagnosis not present

## 2013-04-30 DIAGNOSIS — R279 Unspecified lack of coordination: Secondary | ICD-10-CM | POA: Diagnosis not present

## 2013-05-01 DIAGNOSIS — R269 Unspecified abnormalities of gait and mobility: Secondary | ICD-10-CM | POA: Diagnosis not present

## 2013-05-01 DIAGNOSIS — R293 Abnormal posture: Secondary | ICD-10-CM | POA: Diagnosis not present

## 2013-05-01 DIAGNOSIS — M6281 Muscle weakness (generalized): Secondary | ICD-10-CM | POA: Diagnosis not present

## 2013-05-01 DIAGNOSIS — R279 Unspecified lack of coordination: Secondary | ICD-10-CM | POA: Diagnosis not present

## 2013-05-01 DIAGNOSIS — I619 Nontraumatic intracerebral hemorrhage, unspecified: Secondary | ICD-10-CM | POA: Diagnosis not present

## 2013-05-02 DIAGNOSIS — R279 Unspecified lack of coordination: Secondary | ICD-10-CM | POA: Diagnosis not present

## 2013-05-02 DIAGNOSIS — R293 Abnormal posture: Secondary | ICD-10-CM | POA: Diagnosis not present

## 2013-05-02 DIAGNOSIS — R269 Unspecified abnormalities of gait and mobility: Secondary | ICD-10-CM | POA: Diagnosis not present

## 2013-05-02 DIAGNOSIS — I619 Nontraumatic intracerebral hemorrhage, unspecified: Secondary | ICD-10-CM | POA: Diagnosis not present

## 2013-05-02 DIAGNOSIS — M6281 Muscle weakness (generalized): Secondary | ICD-10-CM | POA: Diagnosis not present

## 2013-05-03 DIAGNOSIS — I619 Nontraumatic intracerebral hemorrhage, unspecified: Secondary | ICD-10-CM | POA: Diagnosis not present

## 2013-05-03 DIAGNOSIS — M6281 Muscle weakness (generalized): Secondary | ICD-10-CM | POA: Diagnosis not present

## 2013-05-03 DIAGNOSIS — R293 Abnormal posture: Secondary | ICD-10-CM | POA: Diagnosis not present

## 2013-05-03 DIAGNOSIS — R269 Unspecified abnormalities of gait and mobility: Secondary | ICD-10-CM | POA: Diagnosis not present

## 2013-05-03 DIAGNOSIS — R279 Unspecified lack of coordination: Secondary | ICD-10-CM | POA: Diagnosis not present

## 2013-05-04 DIAGNOSIS — R269 Unspecified abnormalities of gait and mobility: Secondary | ICD-10-CM | POA: Diagnosis not present

## 2013-05-04 DIAGNOSIS — R279 Unspecified lack of coordination: Secondary | ICD-10-CM | POA: Diagnosis not present

## 2013-05-04 DIAGNOSIS — I619 Nontraumatic intracerebral hemorrhage, unspecified: Secondary | ICD-10-CM | POA: Diagnosis not present

## 2013-05-04 DIAGNOSIS — R293 Abnormal posture: Secondary | ICD-10-CM | POA: Diagnosis not present

## 2013-05-04 DIAGNOSIS — M6281 Muscle weakness (generalized): Secondary | ICD-10-CM | POA: Diagnosis not present

## 2013-05-05 DIAGNOSIS — I619 Nontraumatic intracerebral hemorrhage, unspecified: Secondary | ICD-10-CM | POA: Diagnosis not present

## 2013-05-05 DIAGNOSIS — R293 Abnormal posture: Secondary | ICD-10-CM | POA: Diagnosis not present

## 2013-05-05 DIAGNOSIS — R279 Unspecified lack of coordination: Secondary | ICD-10-CM | POA: Diagnosis not present

## 2013-05-05 DIAGNOSIS — R269 Unspecified abnormalities of gait and mobility: Secondary | ICD-10-CM | POA: Diagnosis not present

## 2013-05-05 DIAGNOSIS — M6281 Muscle weakness (generalized): Secondary | ICD-10-CM | POA: Diagnosis not present

## 2013-05-06 DIAGNOSIS — I619 Nontraumatic intracerebral hemorrhage, unspecified: Secondary | ICD-10-CM | POA: Diagnosis not present

## 2013-05-06 DIAGNOSIS — M6281 Muscle weakness (generalized): Secondary | ICD-10-CM | POA: Diagnosis not present

## 2013-05-06 DIAGNOSIS — R279 Unspecified lack of coordination: Secondary | ICD-10-CM | POA: Diagnosis not present

## 2013-05-06 DIAGNOSIS — R269 Unspecified abnormalities of gait and mobility: Secondary | ICD-10-CM | POA: Diagnosis not present

## 2013-05-06 DIAGNOSIS — R293 Abnormal posture: Secondary | ICD-10-CM | POA: Diagnosis not present

## 2013-05-07 DIAGNOSIS — R279 Unspecified lack of coordination: Secondary | ICD-10-CM | POA: Diagnosis not present

## 2013-05-07 DIAGNOSIS — R293 Abnormal posture: Secondary | ICD-10-CM | POA: Diagnosis not present

## 2013-05-07 DIAGNOSIS — R269 Unspecified abnormalities of gait and mobility: Secondary | ICD-10-CM | POA: Diagnosis not present

## 2013-05-07 DIAGNOSIS — M6281 Muscle weakness (generalized): Secondary | ICD-10-CM | POA: Diagnosis not present

## 2013-05-07 DIAGNOSIS — I619 Nontraumatic intracerebral hemorrhage, unspecified: Secondary | ICD-10-CM | POA: Diagnosis not present

## 2013-05-08 DIAGNOSIS — M6281 Muscle weakness (generalized): Secondary | ICD-10-CM | POA: Diagnosis not present

## 2013-05-08 DIAGNOSIS — R279 Unspecified lack of coordination: Secondary | ICD-10-CM | POA: Diagnosis not present

## 2013-05-08 DIAGNOSIS — R269 Unspecified abnormalities of gait and mobility: Secondary | ICD-10-CM | POA: Diagnosis not present

## 2013-05-08 DIAGNOSIS — I619 Nontraumatic intracerebral hemorrhage, unspecified: Secondary | ICD-10-CM | POA: Diagnosis not present

## 2013-05-08 DIAGNOSIS — R293 Abnormal posture: Secondary | ICD-10-CM | POA: Diagnosis not present

## 2013-05-09 DIAGNOSIS — R269 Unspecified abnormalities of gait and mobility: Secondary | ICD-10-CM | POA: Diagnosis not present

## 2013-05-09 DIAGNOSIS — R279 Unspecified lack of coordination: Secondary | ICD-10-CM | POA: Diagnosis not present

## 2013-05-09 DIAGNOSIS — I619 Nontraumatic intracerebral hemorrhage, unspecified: Secondary | ICD-10-CM | POA: Diagnosis not present

## 2013-05-09 DIAGNOSIS — R293 Abnormal posture: Secondary | ICD-10-CM | POA: Diagnosis not present

## 2013-05-09 DIAGNOSIS — M6281 Muscle weakness (generalized): Secondary | ICD-10-CM | POA: Diagnosis not present

## 2013-05-11 DIAGNOSIS — M6281 Muscle weakness (generalized): Secondary | ICD-10-CM | POA: Diagnosis not present

## 2013-05-11 DIAGNOSIS — I619 Nontraumatic intracerebral hemorrhage, unspecified: Secondary | ICD-10-CM | POA: Diagnosis not present

## 2013-05-11 DIAGNOSIS — R269 Unspecified abnormalities of gait and mobility: Secondary | ICD-10-CM | POA: Diagnosis not present

## 2013-05-11 DIAGNOSIS — R279 Unspecified lack of coordination: Secondary | ICD-10-CM | POA: Diagnosis not present

## 2013-05-11 DIAGNOSIS — R293 Abnormal posture: Secondary | ICD-10-CM | POA: Diagnosis not present

## 2013-05-14 DIAGNOSIS — R293 Abnormal posture: Secondary | ICD-10-CM | POA: Diagnosis not present

## 2013-05-14 DIAGNOSIS — M6281 Muscle weakness (generalized): Secondary | ICD-10-CM | POA: Diagnosis not present

## 2013-05-14 DIAGNOSIS — Z961 Presence of intraocular lens: Secondary | ICD-10-CM | POA: Diagnosis not present

## 2013-05-14 DIAGNOSIS — R269 Unspecified abnormalities of gait and mobility: Secondary | ICD-10-CM | POA: Diagnosis not present

## 2013-05-14 DIAGNOSIS — I619 Nontraumatic intracerebral hemorrhage, unspecified: Secondary | ICD-10-CM | POA: Diagnosis not present

## 2013-05-14 DIAGNOSIS — R279 Unspecified lack of coordination: Secondary | ICD-10-CM | POA: Diagnosis not present

## 2013-05-14 DIAGNOSIS — H04129 Dry eye syndrome of unspecified lacrimal gland: Secondary | ICD-10-CM | POA: Diagnosis not present

## 2013-05-15 DIAGNOSIS — R279 Unspecified lack of coordination: Secondary | ICD-10-CM | POA: Diagnosis not present

## 2013-05-15 DIAGNOSIS — M6281 Muscle weakness (generalized): Secondary | ICD-10-CM | POA: Diagnosis not present

## 2013-05-15 DIAGNOSIS — I619 Nontraumatic intracerebral hemorrhage, unspecified: Secondary | ICD-10-CM | POA: Diagnosis not present

## 2013-05-15 DIAGNOSIS — R269 Unspecified abnormalities of gait and mobility: Secondary | ICD-10-CM | POA: Diagnosis not present

## 2013-05-15 DIAGNOSIS — R293 Abnormal posture: Secondary | ICD-10-CM | POA: Diagnosis not present

## 2013-05-16 DIAGNOSIS — R293 Abnormal posture: Secondary | ICD-10-CM | POA: Diagnosis not present

## 2013-05-16 DIAGNOSIS — M6281 Muscle weakness (generalized): Secondary | ICD-10-CM | POA: Diagnosis not present

## 2013-05-16 DIAGNOSIS — I619 Nontraumatic intracerebral hemorrhage, unspecified: Secondary | ICD-10-CM | POA: Diagnosis not present

## 2013-05-16 DIAGNOSIS — R279 Unspecified lack of coordination: Secondary | ICD-10-CM | POA: Diagnosis not present

## 2013-05-16 DIAGNOSIS — R269 Unspecified abnormalities of gait and mobility: Secondary | ICD-10-CM | POA: Diagnosis not present

## 2013-05-17 DIAGNOSIS — R279 Unspecified lack of coordination: Secondary | ICD-10-CM | POA: Diagnosis not present

## 2013-05-17 DIAGNOSIS — M6281 Muscle weakness (generalized): Secondary | ICD-10-CM | POA: Diagnosis not present

## 2013-05-17 DIAGNOSIS — R293 Abnormal posture: Secondary | ICD-10-CM | POA: Diagnosis not present

## 2013-05-17 DIAGNOSIS — R269 Unspecified abnormalities of gait and mobility: Secondary | ICD-10-CM | POA: Diagnosis not present

## 2013-05-17 DIAGNOSIS — I619 Nontraumatic intracerebral hemorrhage, unspecified: Secondary | ICD-10-CM | POA: Diagnosis not present

## 2013-05-18 DIAGNOSIS — M6281 Muscle weakness (generalized): Secondary | ICD-10-CM | POA: Diagnosis not present

## 2013-05-18 DIAGNOSIS — R279 Unspecified lack of coordination: Secondary | ICD-10-CM | POA: Diagnosis not present

## 2013-05-18 DIAGNOSIS — R269 Unspecified abnormalities of gait and mobility: Secondary | ICD-10-CM | POA: Diagnosis not present

## 2013-05-18 DIAGNOSIS — I619 Nontraumatic intracerebral hemorrhage, unspecified: Secondary | ICD-10-CM | POA: Diagnosis not present

## 2013-05-18 DIAGNOSIS — R293 Abnormal posture: Secondary | ICD-10-CM | POA: Diagnosis not present

## 2013-05-21 DIAGNOSIS — R293 Abnormal posture: Secondary | ICD-10-CM | POA: Diagnosis not present

## 2013-05-21 DIAGNOSIS — M6281 Muscle weakness (generalized): Secondary | ICD-10-CM | POA: Diagnosis not present

## 2013-05-21 DIAGNOSIS — R279 Unspecified lack of coordination: Secondary | ICD-10-CM | POA: Diagnosis not present

## 2013-05-21 DIAGNOSIS — I619 Nontraumatic intracerebral hemorrhage, unspecified: Secondary | ICD-10-CM | POA: Diagnosis not present

## 2013-05-21 DIAGNOSIS — R269 Unspecified abnormalities of gait and mobility: Secondary | ICD-10-CM | POA: Diagnosis not present

## 2013-05-22 DIAGNOSIS — R269 Unspecified abnormalities of gait and mobility: Secondary | ICD-10-CM | POA: Diagnosis not present

## 2013-05-22 DIAGNOSIS — R293 Abnormal posture: Secondary | ICD-10-CM | POA: Diagnosis not present

## 2013-05-22 DIAGNOSIS — M6281 Muscle weakness (generalized): Secondary | ICD-10-CM | POA: Diagnosis not present

## 2013-05-22 DIAGNOSIS — I619 Nontraumatic intracerebral hemorrhage, unspecified: Secondary | ICD-10-CM | POA: Diagnosis not present

## 2013-05-22 DIAGNOSIS — R279 Unspecified lack of coordination: Secondary | ICD-10-CM | POA: Diagnosis not present

## 2013-05-23 DIAGNOSIS — R279 Unspecified lack of coordination: Secondary | ICD-10-CM | POA: Diagnosis not present

## 2013-05-23 DIAGNOSIS — R293 Abnormal posture: Secondary | ICD-10-CM | POA: Diagnosis not present

## 2013-05-23 DIAGNOSIS — I619 Nontraumatic intracerebral hemorrhage, unspecified: Secondary | ICD-10-CM | POA: Diagnosis not present

## 2013-05-23 DIAGNOSIS — R269 Unspecified abnormalities of gait and mobility: Secondary | ICD-10-CM | POA: Diagnosis not present

## 2013-05-23 DIAGNOSIS — M6281 Muscle weakness (generalized): Secondary | ICD-10-CM | POA: Diagnosis not present

## 2013-05-24 DIAGNOSIS — R293 Abnormal posture: Secondary | ICD-10-CM | POA: Diagnosis not present

## 2013-05-24 DIAGNOSIS — R269 Unspecified abnormalities of gait and mobility: Secondary | ICD-10-CM | POA: Diagnosis not present

## 2013-05-24 DIAGNOSIS — R279 Unspecified lack of coordination: Secondary | ICD-10-CM | POA: Diagnosis not present

## 2013-05-24 DIAGNOSIS — M6281 Muscle weakness (generalized): Secondary | ICD-10-CM | POA: Diagnosis not present

## 2013-05-24 DIAGNOSIS — N390 Urinary tract infection, site not specified: Secondary | ICD-10-CM | POA: Diagnosis not present

## 2013-05-24 DIAGNOSIS — R6889 Other general symptoms and signs: Secondary | ICD-10-CM | POA: Diagnosis not present

## 2013-05-24 DIAGNOSIS — I619 Nontraumatic intracerebral hemorrhage, unspecified: Secondary | ICD-10-CM | POA: Diagnosis not present

## 2013-05-25 DIAGNOSIS — R293 Abnormal posture: Secondary | ICD-10-CM | POA: Diagnosis not present

## 2013-05-25 DIAGNOSIS — R279 Unspecified lack of coordination: Secondary | ICD-10-CM | POA: Diagnosis not present

## 2013-05-25 DIAGNOSIS — I619 Nontraumatic intracerebral hemorrhage, unspecified: Secondary | ICD-10-CM | POA: Diagnosis not present

## 2013-05-25 DIAGNOSIS — M6281 Muscle weakness (generalized): Secondary | ICD-10-CM | POA: Diagnosis not present

## 2013-05-25 DIAGNOSIS — R269 Unspecified abnormalities of gait and mobility: Secondary | ICD-10-CM | POA: Diagnosis not present

## 2013-05-27 DIAGNOSIS — F329 Major depressive disorder, single episode, unspecified: Secondary | ICD-10-CM | POA: Diagnosis not present

## 2013-05-27 DIAGNOSIS — F29 Unspecified psychosis not due to a substance or known physiological condition: Secondary | ICD-10-CM | POA: Diagnosis not present

## 2013-05-28 DIAGNOSIS — R279 Unspecified lack of coordination: Secondary | ICD-10-CM | POA: Diagnosis not present

## 2013-05-28 DIAGNOSIS — R269 Unspecified abnormalities of gait and mobility: Secondary | ICD-10-CM | POA: Diagnosis not present

## 2013-05-28 DIAGNOSIS — R293 Abnormal posture: Secondary | ICD-10-CM | POA: Diagnosis not present

## 2013-05-28 DIAGNOSIS — I619 Nontraumatic intracerebral hemorrhage, unspecified: Secondary | ICD-10-CM | POA: Diagnosis not present

## 2013-05-28 DIAGNOSIS — M6281 Muscle weakness (generalized): Secondary | ICD-10-CM | POA: Diagnosis not present

## 2013-05-29 DIAGNOSIS — R279 Unspecified lack of coordination: Secondary | ICD-10-CM | POA: Diagnosis not present

## 2013-05-29 DIAGNOSIS — R269 Unspecified abnormalities of gait and mobility: Secondary | ICD-10-CM | POA: Diagnosis not present

## 2013-05-29 DIAGNOSIS — I619 Nontraumatic intracerebral hemorrhage, unspecified: Secondary | ICD-10-CM | POA: Diagnosis not present

## 2013-05-29 DIAGNOSIS — R293 Abnormal posture: Secondary | ICD-10-CM | POA: Diagnosis not present

## 2013-05-29 DIAGNOSIS — M6281 Muscle weakness (generalized): Secondary | ICD-10-CM | POA: Diagnosis not present

## 2013-05-30 DIAGNOSIS — R279 Unspecified lack of coordination: Secondary | ICD-10-CM | POA: Diagnosis not present

## 2013-05-30 DIAGNOSIS — I619 Nontraumatic intracerebral hemorrhage, unspecified: Secondary | ICD-10-CM | POA: Diagnosis not present

## 2013-05-30 DIAGNOSIS — R269 Unspecified abnormalities of gait and mobility: Secondary | ICD-10-CM | POA: Diagnosis not present

## 2013-05-30 DIAGNOSIS — R293 Abnormal posture: Secondary | ICD-10-CM | POA: Diagnosis not present

## 2013-05-30 DIAGNOSIS — M6281 Muscle weakness (generalized): Secondary | ICD-10-CM | POA: Diagnosis not present

## 2013-05-31 DIAGNOSIS — R279 Unspecified lack of coordination: Secondary | ICD-10-CM | POA: Diagnosis not present

## 2013-05-31 DIAGNOSIS — R269 Unspecified abnormalities of gait and mobility: Secondary | ICD-10-CM | POA: Diagnosis not present

## 2013-05-31 DIAGNOSIS — R293 Abnormal posture: Secondary | ICD-10-CM | POA: Diagnosis not present

## 2013-05-31 DIAGNOSIS — M6281 Muscle weakness (generalized): Secondary | ICD-10-CM | POA: Diagnosis not present

## 2013-05-31 DIAGNOSIS — I619 Nontraumatic intracerebral hemorrhage, unspecified: Secondary | ICD-10-CM | POA: Diagnosis not present

## 2013-06-01 DIAGNOSIS — R293 Abnormal posture: Secondary | ICD-10-CM | POA: Diagnosis not present

## 2013-06-01 DIAGNOSIS — R279 Unspecified lack of coordination: Secondary | ICD-10-CM | POA: Diagnosis not present

## 2013-06-01 DIAGNOSIS — I619 Nontraumatic intracerebral hemorrhage, unspecified: Secondary | ICD-10-CM | POA: Diagnosis not present

## 2013-06-01 DIAGNOSIS — M6281 Muscle weakness (generalized): Secondary | ICD-10-CM | POA: Diagnosis not present

## 2013-06-01 DIAGNOSIS — R269 Unspecified abnormalities of gait and mobility: Secondary | ICD-10-CM | POA: Diagnosis not present

## 2013-06-05 DIAGNOSIS — R21 Rash and other nonspecific skin eruption: Secondary | ICD-10-CM | POA: Diagnosis not present

## 2013-06-11 DIAGNOSIS — F411 Generalized anxiety disorder: Secondary | ICD-10-CM | POA: Diagnosis not present

## 2013-06-11 DIAGNOSIS — F39 Unspecified mood [affective] disorder: Secondary | ICD-10-CM | POA: Diagnosis not present

## 2013-06-11 DIAGNOSIS — F068 Other specified mental disorders due to known physiological condition: Secondary | ICD-10-CM | POA: Diagnosis not present

## 2013-06-15 DIAGNOSIS — F411 Generalized anxiety disorder: Secondary | ICD-10-CM | POA: Diagnosis not present

## 2013-06-15 DIAGNOSIS — F39 Unspecified mood [affective] disorder: Secondary | ICD-10-CM | POA: Diagnosis not present

## 2013-06-15 DIAGNOSIS — F068 Other specified mental disorders due to known physiological condition: Secondary | ICD-10-CM | POA: Diagnosis not present

## 2013-06-20 DIAGNOSIS — F068 Other specified mental disorders due to known physiological condition: Secondary | ICD-10-CM | POA: Diagnosis not present

## 2013-06-20 DIAGNOSIS — R112 Nausea with vomiting, unspecified: Secondary | ICD-10-CM | POA: Diagnosis not present

## 2013-06-20 DIAGNOSIS — F39 Unspecified mood [affective] disorder: Secondary | ICD-10-CM | POA: Diagnosis not present

## 2013-06-22 DIAGNOSIS — F068 Other specified mental disorders due to known physiological condition: Secondary | ICD-10-CM | POA: Diagnosis not present

## 2013-06-22 DIAGNOSIS — J111 Influenza due to unidentified influenza virus with other respiratory manifestations: Secondary | ICD-10-CM | POA: Diagnosis not present

## 2013-06-22 DIAGNOSIS — R300 Dysuria: Secondary | ICD-10-CM | POA: Diagnosis not present

## 2013-06-22 DIAGNOSIS — F39 Unspecified mood [affective] disorder: Secondary | ICD-10-CM | POA: Diagnosis not present

## 2013-06-22 DIAGNOSIS — R6889 Other general symptoms and signs: Secondary | ICD-10-CM | POA: Diagnosis not present

## 2013-06-22 DIAGNOSIS — R404 Transient alteration of awareness: Secondary | ICD-10-CM | POA: Diagnosis not present

## 2013-06-22 DIAGNOSIS — E46 Unspecified protein-calorie malnutrition: Secondary | ICD-10-CM | POA: Diagnosis not present

## 2013-06-22 DIAGNOSIS — R112 Nausea with vomiting, unspecified: Secondary | ICD-10-CM | POA: Diagnosis not present

## 2013-06-23 DIAGNOSIS — R5381 Other malaise: Secondary | ICD-10-CM | POA: Diagnosis not present

## 2013-06-25 DIAGNOSIS — F068 Other specified mental disorders due to known physiological condition: Secondary | ICD-10-CM | POA: Diagnosis not present

## 2013-06-25 DIAGNOSIS — R112 Nausea with vomiting, unspecified: Secondary | ICD-10-CM | POA: Diagnosis not present

## 2013-06-25 DIAGNOSIS — J111 Influenza due to unidentified influenza virus with other respiratory manifestations: Secondary | ICD-10-CM | POA: Diagnosis not present

## 2013-06-25 DIAGNOSIS — R5381 Other malaise: Secondary | ICD-10-CM | POA: Diagnosis not present

## 2013-06-25 DIAGNOSIS — F39 Unspecified mood [affective] disorder: Secondary | ICD-10-CM | POA: Diagnosis not present

## 2013-06-25 DIAGNOSIS — E039 Hypothyroidism, unspecified: Secondary | ICD-10-CM | POA: Diagnosis not present

## 2013-06-25 DIAGNOSIS — R6889 Other general symptoms and signs: Secondary | ICD-10-CM | POA: Diagnosis not present

## 2013-06-25 DIAGNOSIS — R404 Transient alteration of awareness: Secondary | ICD-10-CM | POA: Diagnosis not present

## 2013-06-25 DIAGNOSIS — E46 Unspecified protein-calorie malnutrition: Secondary | ICD-10-CM | POA: Diagnosis not present

## 2013-06-25 DIAGNOSIS — I10 Essential (primary) hypertension: Secondary | ICD-10-CM | POA: Diagnosis not present

## 2013-06-29 DIAGNOSIS — R21 Rash and other nonspecific skin eruption: Secondary | ICD-10-CM | POA: Diagnosis not present

## 2013-06-29 DIAGNOSIS — L270 Generalized skin eruption due to drugs and medicaments taken internally: Secondary | ICD-10-CM | POA: Diagnosis not present

## 2013-07-01 DIAGNOSIS — F3289 Other specified depressive episodes: Secondary | ICD-10-CM | POA: Diagnosis not present

## 2013-07-01 DIAGNOSIS — F329 Major depressive disorder, single episode, unspecified: Secondary | ICD-10-CM | POA: Diagnosis not present

## 2013-07-06 DIAGNOSIS — L270 Generalized skin eruption due to drugs and medicaments taken internally: Secondary | ICD-10-CM | POA: Diagnosis not present

## 2013-07-06 DIAGNOSIS — R21 Rash and other nonspecific skin eruption: Secondary | ICD-10-CM | POA: Diagnosis not present

## 2013-07-13 DIAGNOSIS — E039 Hypothyroidism, unspecified: Secondary | ICD-10-CM | POA: Diagnosis not present

## 2013-07-13 DIAGNOSIS — R21 Rash and other nonspecific skin eruption: Secondary | ICD-10-CM | POA: Diagnosis not present

## 2013-07-13 DIAGNOSIS — E119 Type 2 diabetes mellitus without complications: Secondary | ICD-10-CM | POA: Diagnosis not present

## 2013-07-18 DIAGNOSIS — E1049 Type 1 diabetes mellitus with other diabetic neurological complication: Secondary | ICD-10-CM | POA: Diagnosis not present

## 2013-07-21 DIAGNOSIS — I6350 Cerebral infarction due to unspecified occlusion or stenosis of unspecified cerebral artery: Secondary | ICD-10-CM | POA: Diagnosis not present

## 2013-07-21 DIAGNOSIS — E46 Unspecified protein-calorie malnutrition: Secondary | ICD-10-CM | POA: Diagnosis not present

## 2013-07-21 DIAGNOSIS — I2510 Atherosclerotic heart disease of native coronary artery without angina pectoris: Secondary | ICD-10-CM | POA: Diagnosis not present

## 2013-07-21 DIAGNOSIS — E039 Hypothyroidism, unspecified: Secondary | ICD-10-CM | POA: Diagnosis not present

## 2013-07-21 DIAGNOSIS — F068 Other specified mental disorders due to known physiological condition: Secondary | ICD-10-CM | POA: Diagnosis not present

## 2013-07-21 DIAGNOSIS — F29 Unspecified psychosis not due to a substance or known physiological condition: Secondary | ICD-10-CM | POA: Diagnosis not present

## 2013-07-21 DIAGNOSIS — F39 Unspecified mood [affective] disorder: Secondary | ICD-10-CM | POA: Diagnosis not present

## 2013-07-21 DIAGNOSIS — K219 Gastro-esophageal reflux disease without esophagitis: Secondary | ICD-10-CM | POA: Diagnosis not present

## 2013-07-30 DIAGNOSIS — E039 Hypothyroidism, unspecified: Secondary | ICD-10-CM | POA: Diagnosis not present

## 2013-08-03 DIAGNOSIS — F4489 Other dissociative and conversion disorders: Secondary | ICD-10-CM | POA: Diagnosis not present

## 2013-08-05 DIAGNOSIS — F3289 Other specified depressive episodes: Secondary | ICD-10-CM | POA: Diagnosis not present

## 2013-08-05 DIAGNOSIS — F329 Major depressive disorder, single episode, unspecified: Secondary | ICD-10-CM | POA: Diagnosis not present

## 2013-08-06 DIAGNOSIS — R21 Rash and other nonspecific skin eruption: Secondary | ICD-10-CM | POA: Diagnosis not present

## 2013-08-06 DIAGNOSIS — N390 Urinary tract infection, site not specified: Secondary | ICD-10-CM | POA: Diagnosis not present

## 2013-08-07 DIAGNOSIS — R21 Rash and other nonspecific skin eruption: Secondary | ICD-10-CM | POA: Diagnosis not present

## 2013-08-07 DIAGNOSIS — N390 Urinary tract infection, site not specified: Secondary | ICD-10-CM | POA: Diagnosis not present

## 2013-08-30 DIAGNOSIS — I619 Nontraumatic intracerebral hemorrhage, unspecified: Secondary | ICD-10-CM | POA: Diagnosis not present

## 2013-08-30 DIAGNOSIS — M6281 Muscle weakness (generalized): Secondary | ICD-10-CM | POA: Diagnosis not present

## 2013-08-31 DIAGNOSIS — I619 Nontraumatic intracerebral hemorrhage, unspecified: Secondary | ICD-10-CM | POA: Diagnosis not present

## 2013-08-31 DIAGNOSIS — M6281 Muscle weakness (generalized): Secondary | ICD-10-CM | POA: Diagnosis not present

## 2013-09-02 DIAGNOSIS — F3289 Other specified depressive episodes: Secondary | ICD-10-CM | POA: Diagnosis not present

## 2013-09-02 DIAGNOSIS — F329 Major depressive disorder, single episode, unspecified: Secondary | ICD-10-CM | POA: Diagnosis not present

## 2013-09-03 DIAGNOSIS — I619 Nontraumatic intracerebral hemorrhage, unspecified: Secondary | ICD-10-CM | POA: Diagnosis not present

## 2013-09-03 DIAGNOSIS — M6281 Muscle weakness (generalized): Secondary | ICD-10-CM | POA: Diagnosis not present

## 2013-09-04 DIAGNOSIS — I619 Nontraumatic intracerebral hemorrhage, unspecified: Secondary | ICD-10-CM | POA: Diagnosis not present

## 2013-09-04 DIAGNOSIS — M6281 Muscle weakness (generalized): Secondary | ICD-10-CM | POA: Diagnosis not present

## 2013-09-06 DIAGNOSIS — I619 Nontraumatic intracerebral hemorrhage, unspecified: Secondary | ICD-10-CM | POA: Diagnosis not present

## 2013-09-06 DIAGNOSIS — M6281 Muscle weakness (generalized): Secondary | ICD-10-CM | POA: Diagnosis not present

## 2013-09-07 DIAGNOSIS — M6281 Muscle weakness (generalized): Secondary | ICD-10-CM | POA: Diagnosis not present

## 2013-09-07 DIAGNOSIS — R300 Dysuria: Secondary | ICD-10-CM | POA: Diagnosis not present

## 2013-09-07 DIAGNOSIS — I619 Nontraumatic intracerebral hemorrhage, unspecified: Secondary | ICD-10-CM | POA: Diagnosis not present

## 2013-09-08 DIAGNOSIS — M6281 Muscle weakness (generalized): Secondary | ICD-10-CM | POA: Diagnosis not present

## 2013-09-08 DIAGNOSIS — I619 Nontraumatic intracerebral hemorrhage, unspecified: Secondary | ICD-10-CM | POA: Diagnosis not present

## 2013-09-10 DIAGNOSIS — N390 Urinary tract infection, site not specified: Secondary | ICD-10-CM | POA: Diagnosis not present

## 2013-09-10 DIAGNOSIS — M6281 Muscle weakness (generalized): Secondary | ICD-10-CM | POA: Diagnosis not present

## 2013-09-10 DIAGNOSIS — I619 Nontraumatic intracerebral hemorrhage, unspecified: Secondary | ICD-10-CM | POA: Diagnosis not present

## 2013-09-11 DIAGNOSIS — I619 Nontraumatic intracerebral hemorrhage, unspecified: Secondary | ICD-10-CM | POA: Diagnosis not present

## 2013-09-11 DIAGNOSIS — M6281 Muscle weakness (generalized): Secondary | ICD-10-CM | POA: Diagnosis not present

## 2013-09-12 DIAGNOSIS — M6281 Muscle weakness (generalized): Secondary | ICD-10-CM | POA: Diagnosis not present

## 2013-09-12 DIAGNOSIS — I619 Nontraumatic intracerebral hemorrhage, unspecified: Secondary | ICD-10-CM | POA: Diagnosis not present

## 2013-09-16 DIAGNOSIS — F068 Other specified mental disorders due to known physiological condition: Secondary | ICD-10-CM | POA: Diagnosis not present

## 2013-09-16 DIAGNOSIS — N390 Urinary tract infection, site not specified: Secondary | ICD-10-CM | POA: Diagnosis not present

## 2013-09-21 DIAGNOSIS — I10 Essential (primary) hypertension: Secondary | ICD-10-CM | POA: Diagnosis not present

## 2013-09-21 DIAGNOSIS — R609 Edema, unspecified: Secondary | ICD-10-CM | POA: Diagnosis not present

## 2013-09-30 DIAGNOSIS — F29 Unspecified psychosis not due to a substance or known physiological condition: Secondary | ICD-10-CM | POA: Diagnosis not present

## 2013-09-30 DIAGNOSIS — F329 Major depressive disorder, single episode, unspecified: Secondary | ICD-10-CM | POA: Diagnosis not present

## 2013-09-30 DIAGNOSIS — F3289 Other specified depressive episodes: Secondary | ICD-10-CM | POA: Diagnosis not present

## 2013-10-03 DIAGNOSIS — E1049 Type 1 diabetes mellitus with other diabetic neurological complication: Secondary | ICD-10-CM | POA: Diagnosis not present

## 2013-10-12 DIAGNOSIS — I619 Nontraumatic intracerebral hemorrhage, unspecified: Secondary | ICD-10-CM | POA: Diagnosis not present

## 2013-10-12 DIAGNOSIS — M6281 Muscle weakness (generalized): Secondary | ICD-10-CM | POA: Diagnosis not present

## 2013-10-14 DIAGNOSIS — N390 Urinary tract infection, site not specified: Secondary | ICD-10-CM | POA: Diagnosis not present

## 2013-10-15 DIAGNOSIS — I619 Nontraumatic intracerebral hemorrhage, unspecified: Secondary | ICD-10-CM | POA: Diagnosis not present

## 2013-10-15 DIAGNOSIS — M6281 Muscle weakness (generalized): Secondary | ICD-10-CM | POA: Diagnosis not present

## 2013-10-16 DIAGNOSIS — M6281 Muscle weakness (generalized): Secondary | ICD-10-CM | POA: Diagnosis not present

## 2013-10-16 DIAGNOSIS — I619 Nontraumatic intracerebral hemorrhage, unspecified: Secondary | ICD-10-CM | POA: Diagnosis not present

## 2013-10-17 DIAGNOSIS — M6281 Muscle weakness (generalized): Secondary | ICD-10-CM | POA: Diagnosis not present

## 2013-10-17 DIAGNOSIS — I619 Nontraumatic intracerebral hemorrhage, unspecified: Secondary | ICD-10-CM | POA: Diagnosis not present

## 2013-10-18 DIAGNOSIS — I619 Nontraumatic intracerebral hemorrhage, unspecified: Secondary | ICD-10-CM | POA: Diagnosis not present

## 2013-10-18 DIAGNOSIS — M6281 Muscle weakness (generalized): Secondary | ICD-10-CM | POA: Diagnosis not present

## 2013-10-19 DIAGNOSIS — I619 Nontraumatic intracerebral hemorrhage, unspecified: Secondary | ICD-10-CM | POA: Diagnosis not present

## 2013-10-19 DIAGNOSIS — M6281 Muscle weakness (generalized): Secondary | ICD-10-CM | POA: Diagnosis not present

## 2013-10-22 DIAGNOSIS — M6281 Muscle weakness (generalized): Secondary | ICD-10-CM | POA: Diagnosis not present

## 2013-10-22 DIAGNOSIS — N390 Urinary tract infection, site not specified: Secondary | ICD-10-CM | POA: Diagnosis not present

## 2013-10-22 DIAGNOSIS — I619 Nontraumatic intracerebral hemorrhage, unspecified: Secondary | ICD-10-CM | POA: Diagnosis not present

## 2013-10-23 DIAGNOSIS — M6281 Muscle weakness (generalized): Secondary | ICD-10-CM | POA: Diagnosis not present

## 2013-10-23 DIAGNOSIS — I619 Nontraumatic intracerebral hemorrhage, unspecified: Secondary | ICD-10-CM | POA: Diagnosis not present

## 2013-10-24 DIAGNOSIS — I619 Nontraumatic intracerebral hemorrhage, unspecified: Secondary | ICD-10-CM | POA: Diagnosis not present

## 2013-10-24 DIAGNOSIS — M6281 Muscle weakness (generalized): Secondary | ICD-10-CM | POA: Diagnosis not present

## 2013-10-25 DIAGNOSIS — M6281 Muscle weakness (generalized): Secondary | ICD-10-CM | POA: Diagnosis not present

## 2013-10-25 DIAGNOSIS — I619 Nontraumatic intracerebral hemorrhage, unspecified: Secondary | ICD-10-CM | POA: Diagnosis not present

## 2013-10-26 DIAGNOSIS — I619 Nontraumatic intracerebral hemorrhage, unspecified: Secondary | ICD-10-CM | POA: Diagnosis not present

## 2013-10-26 DIAGNOSIS — M6281 Muscle weakness (generalized): Secondary | ICD-10-CM | POA: Diagnosis not present

## 2013-10-28 DIAGNOSIS — F29 Unspecified psychosis not due to a substance or known physiological condition: Secondary | ICD-10-CM | POA: Diagnosis not present

## 2013-10-28 DIAGNOSIS — F0391 Unspecified dementia with behavioral disturbance: Secondary | ICD-10-CM | POA: Diagnosis not present

## 2013-10-28 DIAGNOSIS — F329 Major depressive disorder, single episode, unspecified: Secondary | ICD-10-CM | POA: Diagnosis not present

## 2013-10-28 DIAGNOSIS — F3289 Other specified depressive episodes: Secondary | ICD-10-CM | POA: Diagnosis not present

## 2013-10-28 DIAGNOSIS — F03918 Unspecified dementia, unspecified severity, with other behavioral disturbance: Secondary | ICD-10-CM | POA: Diagnosis not present

## 2013-10-29 DIAGNOSIS — I619 Nontraumatic intracerebral hemorrhage, unspecified: Secondary | ICD-10-CM | POA: Diagnosis not present

## 2013-10-29 DIAGNOSIS — M6281 Muscle weakness (generalized): Secondary | ICD-10-CM | POA: Diagnosis not present

## 2013-10-30 DIAGNOSIS — I619 Nontraumatic intracerebral hemorrhage, unspecified: Secondary | ICD-10-CM | POA: Diagnosis not present

## 2013-10-30 DIAGNOSIS — M6281 Muscle weakness (generalized): Secondary | ICD-10-CM | POA: Diagnosis not present

## 2013-10-31 DIAGNOSIS — M6281 Muscle weakness (generalized): Secondary | ICD-10-CM | POA: Diagnosis not present

## 2013-10-31 DIAGNOSIS — I619 Nontraumatic intracerebral hemorrhage, unspecified: Secondary | ICD-10-CM | POA: Diagnosis not present

## 2013-11-01 DIAGNOSIS — M6281 Muscle weakness (generalized): Secondary | ICD-10-CM | POA: Diagnosis not present

## 2013-11-01 DIAGNOSIS — I619 Nontraumatic intracerebral hemorrhage, unspecified: Secondary | ICD-10-CM | POA: Diagnosis not present

## 2013-11-02 DIAGNOSIS — I619 Nontraumatic intracerebral hemorrhage, unspecified: Secondary | ICD-10-CM | POA: Diagnosis not present

## 2013-11-02 DIAGNOSIS — M6281 Muscle weakness (generalized): Secondary | ICD-10-CM | POA: Diagnosis not present

## 2013-11-05 DIAGNOSIS — M6281 Muscle weakness (generalized): Secondary | ICD-10-CM | POA: Diagnosis not present

## 2013-11-05 DIAGNOSIS — I619 Nontraumatic intracerebral hemorrhage, unspecified: Secondary | ICD-10-CM | POA: Diagnosis not present

## 2013-11-06 DIAGNOSIS — I619 Nontraumatic intracerebral hemorrhage, unspecified: Secondary | ICD-10-CM | POA: Diagnosis not present

## 2013-11-06 DIAGNOSIS — M6281 Muscle weakness (generalized): Secondary | ICD-10-CM | POA: Diagnosis not present

## 2013-11-07 DIAGNOSIS — M6281 Muscle weakness (generalized): Secondary | ICD-10-CM | POA: Diagnosis not present

## 2013-11-07 DIAGNOSIS — I619 Nontraumatic intracerebral hemorrhage, unspecified: Secondary | ICD-10-CM | POA: Diagnosis not present

## 2013-11-08 DIAGNOSIS — I619 Nontraumatic intracerebral hemorrhage, unspecified: Secondary | ICD-10-CM | POA: Diagnosis not present

## 2013-11-08 DIAGNOSIS — M6281 Muscle weakness (generalized): Secondary | ICD-10-CM | POA: Diagnosis not present

## 2013-11-09 DIAGNOSIS — I619 Nontraumatic intracerebral hemorrhage, unspecified: Secondary | ICD-10-CM | POA: Diagnosis not present

## 2013-11-09 DIAGNOSIS — M6281 Muscle weakness (generalized): Secondary | ICD-10-CM | POA: Diagnosis not present

## 2013-11-12 DIAGNOSIS — I619 Nontraumatic intracerebral hemorrhage, unspecified: Secondary | ICD-10-CM | POA: Diagnosis not present

## 2013-11-12 DIAGNOSIS — M6281 Muscle weakness (generalized): Secondary | ICD-10-CM | POA: Diagnosis not present

## 2013-11-13 DIAGNOSIS — I619 Nontraumatic intracerebral hemorrhage, unspecified: Secondary | ICD-10-CM | POA: Diagnosis not present

## 2013-11-13 DIAGNOSIS — M6281 Muscle weakness (generalized): Secondary | ICD-10-CM | POA: Diagnosis not present

## 2013-11-14 DIAGNOSIS — M6281 Muscle weakness (generalized): Secondary | ICD-10-CM | POA: Diagnosis not present

## 2013-11-14 DIAGNOSIS — I619 Nontraumatic intracerebral hemorrhage, unspecified: Secondary | ICD-10-CM | POA: Diagnosis not present

## 2013-11-15 DIAGNOSIS — I619 Nontraumatic intracerebral hemorrhage, unspecified: Secondary | ICD-10-CM | POA: Diagnosis not present

## 2013-11-15 DIAGNOSIS — M6281 Muscle weakness (generalized): Secondary | ICD-10-CM | POA: Diagnosis not present

## 2013-11-16 DIAGNOSIS — M6281 Muscle weakness (generalized): Secondary | ICD-10-CM | POA: Diagnosis not present

## 2013-11-16 DIAGNOSIS — I619 Nontraumatic intracerebral hemorrhage, unspecified: Secondary | ICD-10-CM | POA: Diagnosis not present

## 2013-11-19 DIAGNOSIS — R059 Cough, unspecified: Secondary | ICD-10-CM | POA: Diagnosis not present

## 2013-11-19 DIAGNOSIS — R05 Cough: Secondary | ICD-10-CM | POA: Diagnosis not present

## 2013-11-19 DIAGNOSIS — R300 Dysuria: Secondary | ICD-10-CM | POA: Diagnosis not present

## 2013-11-20 DIAGNOSIS — R062 Wheezing: Secondary | ICD-10-CM | POA: Diagnosis not present

## 2013-11-20 DIAGNOSIS — I619 Nontraumatic intracerebral hemorrhage, unspecified: Secondary | ICD-10-CM | POA: Diagnosis not present

## 2013-11-20 DIAGNOSIS — M6281 Muscle weakness (generalized): Secondary | ICD-10-CM | POA: Diagnosis not present

## 2013-11-20 DIAGNOSIS — R0989 Other specified symptoms and signs involving the circulatory and respiratory systems: Secondary | ICD-10-CM | POA: Diagnosis not present

## 2013-11-21 DIAGNOSIS — I619 Nontraumatic intracerebral hemorrhage, unspecified: Secondary | ICD-10-CM | POA: Diagnosis not present

## 2013-11-21 DIAGNOSIS — M6281 Muscle weakness (generalized): Secondary | ICD-10-CM | POA: Diagnosis not present

## 2013-11-21 DIAGNOSIS — R300 Dysuria: Secondary | ICD-10-CM | POA: Diagnosis not present

## 2013-11-23 DIAGNOSIS — R05 Cough: Secondary | ICD-10-CM | POA: Diagnosis not present

## 2013-11-23 DIAGNOSIS — R059 Cough, unspecified: Secondary | ICD-10-CM | POA: Diagnosis not present

## 2013-11-23 DIAGNOSIS — R300 Dysuria: Secondary | ICD-10-CM | POA: Diagnosis not present

## 2013-12-02 DIAGNOSIS — F0391 Unspecified dementia with behavioral disturbance: Secondary | ICD-10-CM | POA: Diagnosis not present

## 2013-12-02 DIAGNOSIS — F03918 Unspecified dementia, unspecified severity, with other behavioral disturbance: Secondary | ICD-10-CM | POA: Diagnosis not present

## 2013-12-02 DIAGNOSIS — F29 Unspecified psychosis not due to a substance or known physiological condition: Secondary | ICD-10-CM | POA: Diagnosis not present

## 2013-12-03 DIAGNOSIS — R109 Unspecified abdominal pain: Secondary | ICD-10-CM | POA: Diagnosis not present

## 2013-12-03 DIAGNOSIS — K5900 Constipation, unspecified: Secondary | ICD-10-CM | POA: Diagnosis not present

## 2013-12-03 DIAGNOSIS — K219 Gastro-esophageal reflux disease without esophagitis: Secondary | ICD-10-CM | POA: Diagnosis not present

## 2013-12-04 DIAGNOSIS — K5900 Constipation, unspecified: Secondary | ICD-10-CM | POA: Diagnosis not present

## 2013-12-04 DIAGNOSIS — R109 Unspecified abdominal pain: Secondary | ICD-10-CM | POA: Diagnosis not present

## 2013-12-04 DIAGNOSIS — K219 Gastro-esophageal reflux disease without esophagitis: Secondary | ICD-10-CM | POA: Diagnosis not present

## 2013-12-13 DIAGNOSIS — Z79899 Other long term (current) drug therapy: Secondary | ICD-10-CM | POA: Diagnosis not present

## 2013-12-19 DIAGNOSIS — E1059 Type 1 diabetes mellitus with other circulatory complications: Secondary | ICD-10-CM | POA: Diagnosis not present

## 2013-12-30 DIAGNOSIS — F0391 Unspecified dementia with behavioral disturbance: Secondary | ICD-10-CM | POA: Diagnosis not present

## 2013-12-30 DIAGNOSIS — F03918 Unspecified dementia, unspecified severity, with other behavioral disturbance: Secondary | ICD-10-CM | POA: Diagnosis not present

## 2013-12-30 DIAGNOSIS — F329 Major depressive disorder, single episode, unspecified: Secondary | ICD-10-CM | POA: Diagnosis not present

## 2013-12-30 DIAGNOSIS — F3289 Other specified depressive episodes: Secondary | ICD-10-CM | POA: Diagnosis not present

## 2013-12-30 DIAGNOSIS — F29 Unspecified psychosis not due to a substance or known physiological condition: Secondary | ICD-10-CM | POA: Diagnosis not present

## 2014-01-03 DIAGNOSIS — N390 Urinary tract infection, site not specified: Secondary | ICD-10-CM | POA: Diagnosis not present

## 2014-01-08 DIAGNOSIS — H04129 Dry eye syndrome of unspecified lacrimal gland: Secondary | ICD-10-CM | POA: Diagnosis not present

## 2014-01-27 DIAGNOSIS — F29 Unspecified psychosis not due to a substance or known physiological condition: Secondary | ICD-10-CM | POA: Diagnosis not present

## 2014-01-27 DIAGNOSIS — F0391 Unspecified dementia with behavioral disturbance: Secondary | ICD-10-CM | POA: Diagnosis not present

## 2014-01-27 DIAGNOSIS — F3289 Other specified depressive episodes: Secondary | ICD-10-CM | POA: Diagnosis not present

## 2014-01-27 DIAGNOSIS — F03918 Unspecified dementia, unspecified severity, with other behavioral disturbance: Secondary | ICD-10-CM | POA: Diagnosis not present

## 2014-01-27 DIAGNOSIS — F329 Major depressive disorder, single episode, unspecified: Secondary | ICD-10-CM | POA: Diagnosis not present

## 2014-02-06 DIAGNOSIS — M6281 Muscle weakness (generalized): Secondary | ICD-10-CM | POA: Diagnosis not present

## 2014-02-06 DIAGNOSIS — I619 Nontraumatic intracerebral hemorrhage, unspecified: Secondary | ICD-10-CM | POA: Diagnosis not present

## 2014-02-07 DIAGNOSIS — M6281 Muscle weakness (generalized): Secondary | ICD-10-CM | POA: Diagnosis not present

## 2014-02-07 DIAGNOSIS — I619 Nontraumatic intracerebral hemorrhage, unspecified: Secondary | ICD-10-CM | POA: Diagnosis not present

## 2014-02-08 DIAGNOSIS — E039 Hypothyroidism, unspecified: Secondary | ICD-10-CM | POA: Diagnosis not present

## 2014-02-08 DIAGNOSIS — I619 Nontraumatic intracerebral hemorrhage, unspecified: Secondary | ICD-10-CM | POA: Diagnosis not present

## 2014-02-08 DIAGNOSIS — M6281 Muscle weakness (generalized): Secondary | ICD-10-CM | POA: Diagnosis not present

## 2014-02-08 DIAGNOSIS — K219 Gastro-esophageal reflux disease without esophagitis: Secondary | ICD-10-CM | POA: Diagnosis not present

## 2014-02-08 DIAGNOSIS — F39 Unspecified mood [affective] disorder: Secondary | ICD-10-CM | POA: Diagnosis not present

## 2014-02-08 DIAGNOSIS — E119 Type 2 diabetes mellitus without complications: Secondary | ICD-10-CM | POA: Diagnosis not present

## 2014-02-11 DIAGNOSIS — M6281 Muscle weakness (generalized): Secondary | ICD-10-CM | POA: Diagnosis not present

## 2014-02-11 DIAGNOSIS — I619 Nontraumatic intracerebral hemorrhage, unspecified: Secondary | ICD-10-CM | POA: Diagnosis not present

## 2014-02-11 DIAGNOSIS — E119 Type 2 diabetes mellitus without complications: Secondary | ICD-10-CM | POA: Diagnosis not present

## 2014-02-11 DIAGNOSIS — Z79899 Other long term (current) drug therapy: Secondary | ICD-10-CM | POA: Diagnosis not present

## 2014-02-11 DIAGNOSIS — K219 Gastro-esophageal reflux disease without esophagitis: Secondary | ICD-10-CM | POA: Diagnosis not present

## 2014-02-11 DIAGNOSIS — E039 Hypothyroidism, unspecified: Secondary | ICD-10-CM | POA: Diagnosis not present

## 2014-02-12 DIAGNOSIS — I619 Nontraumatic intracerebral hemorrhage, unspecified: Secondary | ICD-10-CM | POA: Diagnosis not present

## 2014-02-12 DIAGNOSIS — M6281 Muscle weakness (generalized): Secondary | ICD-10-CM | POA: Diagnosis not present

## 2014-02-22 ENCOUNTER — Encounter (HOSPITAL_COMMUNITY): Payer: Self-pay | Admitting: Emergency Medicine

## 2014-02-22 ENCOUNTER — Emergency Department (HOSPITAL_COMMUNITY)
Admission: EM | Admit: 2014-02-22 | Discharge: 2014-02-22 | Disposition: A | Discharge status: Home / Self Care | Payer: Medicare Other | Attending: Emergency Medicine | Admitting: Emergency Medicine

## 2014-02-22 ENCOUNTER — Emergency Department (HOSPITAL_COMMUNITY): Payer: Medicare Other

## 2014-02-22 DIAGNOSIS — E119 Type 2 diabetes mellitus without complications: Secondary | ICD-10-CM | POA: Insufficient documentation

## 2014-02-22 DIAGNOSIS — S99919A Unspecified injury of unspecified ankle, initial encounter: Secondary | ICD-10-CM

## 2014-02-22 DIAGNOSIS — S51811A Laceration without foreign body of right forearm, initial encounter: Secondary | ICD-10-CM

## 2014-02-22 DIAGNOSIS — Z86711 Personal history of pulmonary embolism: Secondary | ICD-10-CM | POA: Insufficient documentation

## 2014-02-22 DIAGNOSIS — S99929A Unspecified injury of unspecified foot, initial encounter: Secondary | ICD-10-CM

## 2014-02-22 DIAGNOSIS — Z79899 Other long term (current) drug therapy: Secondary | ICD-10-CM | POA: Insufficient documentation

## 2014-02-22 DIAGNOSIS — M1990 Unspecified osteoarthritis, unspecified site: Secondary | ICD-10-CM | POA: Diagnosis not present

## 2014-02-22 DIAGNOSIS — F0390 Unspecified dementia without behavioral disturbance: Secondary | ICD-10-CM | POA: Diagnosis not present

## 2014-02-22 DIAGNOSIS — S59909A Unspecified injury of unspecified elbow, initial encounter: Secondary | ICD-10-CM | POA: Diagnosis not present

## 2014-02-22 DIAGNOSIS — Z7902 Long term (current) use of antithrombotics/antiplatelets: Secondary | ICD-10-CM | POA: Insufficient documentation

## 2014-02-22 DIAGNOSIS — Y9210 Unspecified residential institution as the place of occurrence of the external cause: Secondary | ICD-10-CM | POA: Insufficient documentation

## 2014-02-22 DIAGNOSIS — Z23 Encounter for immunization: Secondary | ICD-10-CM | POA: Insufficient documentation

## 2014-02-22 DIAGNOSIS — Z794 Long term (current) use of insulin: Secondary | ICD-10-CM | POA: Insufficient documentation

## 2014-02-22 DIAGNOSIS — S0993XA Unspecified injury of face, initial encounter: Secondary | ICD-10-CM | POA: Diagnosis not present

## 2014-02-22 DIAGNOSIS — T148XXA Other injury of unspecified body region, initial encounter: Secondary | ICD-10-CM | POA: Diagnosis not present

## 2014-02-22 DIAGNOSIS — S6990XA Unspecified injury of unspecified wrist, hand and finger(s), initial encounter: Secondary | ICD-10-CM | POA: Diagnosis not present

## 2014-02-22 DIAGNOSIS — I10 Essential (primary) hypertension: Secondary | ICD-10-CM | POA: Diagnosis not present

## 2014-02-22 DIAGNOSIS — M542 Cervicalgia: Secondary | ICD-10-CM | POA: Diagnosis not present

## 2014-02-22 DIAGNOSIS — E7800 Pure hypercholesterolemia, unspecified: Secondary | ICD-10-CM | POA: Insufficient documentation

## 2014-02-22 DIAGNOSIS — IMO0002 Reserved for concepts with insufficient information to code with codable children: Secondary | ICD-10-CM | POA: Diagnosis not present

## 2014-02-22 DIAGNOSIS — S51809A Unspecified open wound of unspecified forearm, initial encounter: Secondary | ICD-10-CM | POA: Diagnosis not present

## 2014-02-22 DIAGNOSIS — Z951 Presence of aortocoronary bypass graft: Secondary | ICD-10-CM | POA: Insufficient documentation

## 2014-02-22 DIAGNOSIS — E039 Hypothyroidism, unspecified: Secondary | ICD-10-CM | POA: Insufficient documentation

## 2014-02-22 DIAGNOSIS — I2510 Atherosclerotic heart disease of native coronary artery without angina pectoris: Secondary | ICD-10-CM | POA: Diagnosis not present

## 2014-02-22 DIAGNOSIS — S8010XA Contusion of unspecified lower leg, initial encounter: Secondary | ICD-10-CM | POA: Insufficient documentation

## 2014-02-22 DIAGNOSIS — M549 Dorsalgia, unspecified: Secondary | ICD-10-CM | POA: Diagnosis not present

## 2014-02-22 DIAGNOSIS — Z91040 Latex allergy status: Secondary | ICD-10-CM | POA: Insufficient documentation

## 2014-02-22 DIAGNOSIS — Y9389 Activity, other specified: Secondary | ICD-10-CM | POA: Insufficient documentation

## 2014-02-22 DIAGNOSIS — S8990XA Unspecified injury of unspecified lower leg, initial encounter: Secondary | ICD-10-CM | POA: Insufficient documentation

## 2014-02-22 DIAGNOSIS — S199XXA Unspecified injury of neck, initial encounter: Secondary | ICD-10-CM | POA: Diagnosis not present

## 2014-02-22 DIAGNOSIS — M25539 Pain in unspecified wrist: Secondary | ICD-10-CM | POA: Diagnosis not present

## 2014-02-22 DIAGNOSIS — R296 Repeated falls: Secondary | ICD-10-CM | POA: Diagnosis not present

## 2014-02-22 DIAGNOSIS — K219 Gastro-esophageal reflux disease without esophagitis: Secondary | ICD-10-CM | POA: Insufficient documentation

## 2014-02-22 DIAGNOSIS — M79609 Pain in unspecified limb: Secondary | ICD-10-CM | POA: Diagnosis not present

## 2014-02-22 DIAGNOSIS — W19XXXA Unspecified fall, initial encounter: Secondary | ICD-10-CM | POA: Diagnosis not present

## 2014-02-22 DIAGNOSIS — S5010XA Contusion of unspecified forearm, initial encounter: Secondary | ICD-10-CM | POA: Diagnosis not present

## 2014-02-22 DIAGNOSIS — R51 Headache: Secondary | ICD-10-CM | POA: Diagnosis not present

## 2014-02-22 DIAGNOSIS — S79919A Unspecified injury of unspecified hip, initial encounter: Secondary | ICD-10-CM | POA: Diagnosis not present

## 2014-02-22 DIAGNOSIS — M25559 Pain in unspecified hip: Secondary | ICD-10-CM | POA: Diagnosis not present

## 2014-02-22 DIAGNOSIS — S0990XA Unspecified injury of head, initial encounter: Secondary | ICD-10-CM | POA: Diagnosis not present

## 2014-02-22 MED ORDER — TETANUS-DIPHTH-ACELL PERTUSSIS 5-2.5-18.5 LF-MCG/0.5 IM SUSP
0.5000 mL | Freq: Once | INTRAMUSCULAR | Status: AC
  Administered 2014-02-22: 0.5 mL via INTRAMUSCULAR
  Filled 2014-02-22: qty 0.5

## 2014-02-22 MED ORDER — TRAMADOL HCL 50 MG PO TABS: 25.0000 mg | ORAL_TABLET | Freq: Four times a day (QID) | ORAL | Status: DC | PRN

## 2014-02-22 NOTE — ED Notes (Signed)
Patient with no complaints at this time. Respirations even and unlabored. Skin warm/dry. Discharge instructions reviewed with patient at this time. Patient given opportunity to voice concerns/ask questions. Patient discharged at this time and left Emergency Department with steady gait.   

## 2014-02-22 NOTE — ED Notes (Signed)
MD at bedside. 

## 2014-02-22 NOTE — ED Notes (Signed)
PT had an un witnessed fall this pm at skilled nursing facilty. PT poor historian d/t dementia. PT presents with skin tear to right forearm and hematoma with skin tear to left shin. ER MD at bedside at this time.

## 2014-02-22 NOTE — Discharge Instructions (Signed)
Contusion °A contusion is a deep bruise. Contusions happen when an injury causes bleeding under the skin. Signs of bruising include pain, puffiness (swelling), and discolored skin. The contusion may turn blue, purple, or yellow. °HOME CARE  °· Put ice on the injured area. °¨ Put ice in a plastic bag. °¨ Place a towel between your skin and the bag. °¨ Leave the ice on for 15-20 minutes, 03-04 times a day. °· Only take medicine as told by your doctor. °· Rest the injured area. °· If possible, raise (elevate) the injured area to lessen puffiness. °GET HELP RIGHT AWAY IF:  °· You have more bruising or puffiness. °· You have pain that is getting worse. °· Your puffiness or pain is not helped by medicine. °MAKE SURE YOU:  °· Understand these instructions. °· Will watch your condition. °· Will get help right away if you are not doing well or get worse. °Document Released: 11/17/2007 Document Revised: 08/23/2011 Document Reviewed: 04/05/2011 °Exit[employer]® Patient Information ©2015 [employer]. This information is not intended to replace advice given to you by your health care provider. Make sure you discuss any questions you have with your health care provider. ° °

## 2014-02-22 NOTE — ED Provider Notes (Signed)
CSN: [id number]     Arrival date & time 02/22/14  1815 History   First MD Initiated Contact with Patient 02/22/14 1812     Chief Complaint  Patient presents with  . Fall     (Consider location/radiation/quality/duration/timing/severity/associated sxs/prior Treatment) HPI Comments: Patient sent to the emergency department from nursing home for evaluation after a fall. Patient had unwitnessed fall just prior to arrival. Patient was found on the ground, his presumed Giardia out of her wheelchair and ambulate on her own. Patient has been complaining of pain in the right hip area. She has a history of chronic dementia, cannot provide any further information. Level V Caveat due to dementia.  Patient is a 80 y.o. female presenting with fall.  Fall    Past Medical History  Diagnosis Date  . Pure hypercholesterolemia   . Type II or unspecified type diabetes mellitus without mention of complication, not stated as uncontrolled   . Unspecified essential hypertension   . Unspecified transient cerebral ischemia   . Coronary atherosclerosis of native coronary artery     four-vessel CABG, 7/06, ... normal Cardiolite; EF 79%, 8/10  . PUD (peptic ulcer disease)   . Osteoarthritis   . GERD (gastroesophageal reflux disease)   . Hypothyroidism 06/14/2009  . Arthritis   . GIB (gastrointestinal bleeding)     history of  . Stromal tumor of the stomach     history of. partial gastrectomy 2001   Past Surgical History  Procedure Laterality Date  . Coronary artery bypass graft      . four-vessel CABG, 7/06, ... normal Cardiolite; EF 79%, 8/10  . Partial thyroid surgery    . Cataract extraction    . Partial gast[facility]  [facility]  . Abdominal hysterectomy    . Gallbladder resection     Family History  Problem Relation Age of Onset  . Heart attack Brother 76  . Coronary artery disease    . Hypertension     History  Substance Use Topics  . Smoking status: Never Smoker   .  Smokeless tobacco: Never Used  . Alcohol Use: No   OB History   Grav Para Term Preterm Abortions TAB SAB Ect Mult Living                 Review of Systems  Unable to perform ROS: Dementia      Allergies  Codeine; Contrast media; Sulfa antibiotics; Adhesive; and Latex  Home Medications   Prior to Admission medications   Medication Sig Start Date End Date Taking? Authorizing Provider  acetaminophen (TYLENOL) 325 MG tablet Take 325 mg by mouth every 8 (eight) hours as needed. For pain    Historical Provider, MD  Amino Acids-Protein Hydrolys (PRO-STAT PO) Take 30 mLs by mouth 2 (two) times daily.    Historical Provider, MD  calcium carbonate (OS-CAL - DOSED IN MG OF ELEMENTAL CALCIUM) 1250 MG tablet Take 1 tablet by mouth 2 (two) times daily.      Historical Provider, MD  calcium carbonate 1250 MG capsule Take 1,250 mg by mouth 2 (two) times daily.    Historical Provider, MD  cetirizine (ZYRTEC) 10 MG tablet Take 10 mg by mouth daily. Take for 14 days(stop on 03/07/13) then take as needed for allergies    Historical Provider, MD  clopidogrel (PLAVIX) 75 MG tablet Take 75 mg by mouth daily.      Historical Provider, MD  Cranberry 475 MG CAPS Take 475 mg by  mouth daily.    Historical Provider, MD  haloperidol (HALDOL) 0.5 MG tablet Take 0.5 mg by mouth daily.    Historical Provider, MD  insulin glargine (LANTUS) 100 UNIT/ML injection Inject 20 Units into the skin at bedtime.     Historical Provider, MD  isosorbide mononitrate (IMDUR) 30 MG 24 hr tablet Take 30 mg by mouth daily.    Historical Provider, MD  l-methylfolate-b2-b6-b12 (CEREFOLIN) 11-12-48-5 MG TABS Take 1 tablet by mouth daily.    Historical Provider, MD  levothyroxine (SYNTHROID, LEVOTHROID) 88 MCG tablet Take 88 mcg by mouth daily.     Historical Provider, MD  lisinopril (PRINIVIL,ZESTRIL) 10 MG tablet Take 10 mg by mouth daily.     Historical Provider, MD  LORazepam (ATIVAN) 0.5 MG tablet Take 0.5 mg by mouth daily as needed  for anxiety.    Historical Provider, MD  metFORMIN (GLUCOPHAGE) 500 MG tablet Take 500 mg by mouth daily.    Historical Provider, MD  Multiple Vitamins-Minerals (CENTRUM SILVER PO) Take 1 tablet by mouth daily.     Historical Provider, MD  nitroGLYCERIN (NITROSTAT) 0.4 MG SL tablet Place 1 tablet (0.4 mg total) under the tongue every 5 (five) minutes as needed. 09/15/10   Ezra Sites, MD  Nutritional Supplements (GLUCERNA SHAKE PO) Take 327 mLs by mouth 3 (three) times daily.    Historical Provider, MD  Omega-3 Fatty Acids (FISH OIL) 1000 MG CAPS Take 1,000 mg by mouth daily.    Historical Provider, MD  polyethylene glycol powder (GLYCOLAX/MIRALAX) powder Take 17 g by mouth daily.    Historical Provider, MD  ranitidine (ZANTAC) 150 MG capsule Take 150 mg by mouth 2 (two) times daily.      Historical Provider, MD  sertraline (ZOLOFT) 50 MG tablet Take 75 mg by mouth daily.    Historical Provider, MD  shark liver oil-cocoa butter (PREPARATION H) 0.25-3-85.5 % suppository Place 1 suppository rectally at bedtime.    Historical Provider, MD  simvastatin (ZOCOR) 20 MG tablet Take 20 mg by mouth at bedtime.      Historical Provider, MD  Wheat Dextrin (BENEFIBER) POWD Take 5 mLs by mouth daily.    Historical Provider, MD   BP 150/80  Pulse 88  Temp(Src) 98.8 F (37.1 C) (Oral)  Ht 5\' 4"  (1.626 m)  Wt 127 lb (57.607 kg)  BMI 21.79 kg/m2  SpO2 94% Physical Exam  Constitutional: She appears well-developed and well-nourished. No distress.  HENT:  Head: Normocephalic and atraumatic.  Right Ear: Hearing normal.  Left Ear: Hearing normal.  Nose: Nose normal.  Mouth/Throat: Oropharynx is clear and moist and mucous membranes are normal.  Eyes: Conjunctivae and EOM are normal. Pupils are equal, round, and reactive to light.  Neck: Normal range of motion. Neck supple.  Cardiovascular: Regular rhythm, S1 normal and S2 normal.  Exam reveals no gallop and no friction rub.   No murmur  heard. Pulmonary/Chest: Effort normal and breath sounds normal. No respiratory distress. She exhibits no tenderness.  Abdominal: Soft. Normal appearance and bowel sounds are normal. There is no hepatosplenomegaly. There is no tenderness. There is no rebound, no guarding, no tenderness at McBurney's point and negative Murphy's sign. No hernia.  Musculoskeletal: Normal range of motion.       Right hip: She exhibits tenderness (Pain with range of motion). She exhibits no deformity.       Legs: Neurological: She is alert. She has normal strength. She is disoriented. No cranial nerve deficit or sensory  deficit. Coordination normal. GCS eye subscore is 4. GCS verbal subscore is 5. GCS motor subscore is 6.  Skin: Skin is warm, dry and intact. No rash noted. No cyanosis.     Psychiatric: She has a normal mood and affect. Her speech is normal and behavior is normal. Thought content normal.    ED Course  Procedures (including critical care time) Labs Review Labs Reviewed - No data to display  Imaging Review No results found.   EKG Interpretation None      MDM   Final diagnoses:  None   contusions, status post fall  Patient presents to the ER for evaluation after a fall. She had an unwitnessed fall at the nursing center. She was complaining of right hip pain, but she was able to move the hip through range of motion against gravity at arrival. It was therefore low suspicion for fracture. The patient also complained of bruising in her left shin and a right forearm area. X-rays of these areas were negative. Patient also had CT scan of the head, cervical spine, also negative. Thoracic and lumbar spine did not show any acute injury. Patient is able to stand and bear weight on her hip without difficulty here in the ER, no further imaging necessary. She has not had a tetanus vaccination in nearly 10 years. She was vaccinated. Wounds were clean and intact. Return to the nursing center, pain medication  as needed.    Orpah Greek, MD 02/22/14 2041

## 2014-02-23 DIAGNOSIS — R300 Dysuria: Secondary | ICD-10-CM | POA: Diagnosis not present

## 2014-02-25 DIAGNOSIS — T148XXA Other injury of unspecified body region, initial encounter: Secondary | ICD-10-CM | POA: Diagnosis not present

## 2014-02-25 DIAGNOSIS — M79609 Pain in unspecified limb: Secondary | ICD-10-CM | POA: Diagnosis not present

## 2014-02-25 DIAGNOSIS — W19XXXA Unspecified fall, initial encounter: Secondary | ICD-10-CM | POA: Diagnosis not present

## 2014-02-25 DIAGNOSIS — E119 Type 2 diabetes mellitus without complications: Secondary | ICD-10-CM | POA: Diagnosis not present

## 2014-02-25 DIAGNOSIS — N390 Urinary tract infection, site not specified: Secondary | ICD-10-CM | POA: Diagnosis not present

## 2014-03-01 DIAGNOSIS — R5381 Other malaise: Secondary | ICD-10-CM | POA: Diagnosis not present

## 2014-03-01 DIAGNOSIS — W19XXXA Unspecified fall, initial encounter: Secondary | ICD-10-CM | POA: Diagnosis not present

## 2014-03-01 DIAGNOSIS — N390 Urinary tract infection, site not specified: Secondary | ICD-10-CM | POA: Diagnosis not present

## 2014-03-01 DIAGNOSIS — Z79899 Other long term (current) drug therapy: Secondary | ICD-10-CM | POA: Diagnosis not present

## 2014-03-01 IMAGING — CR DG CHEST 1V PORT
1 series · 1 of 1 positions shown · non-contrast
Comparison: July 29, 2011

CLINICAL DATA: Weakness

EXAM:
PORTABLE CHEST - 1 VIEW

[portable]
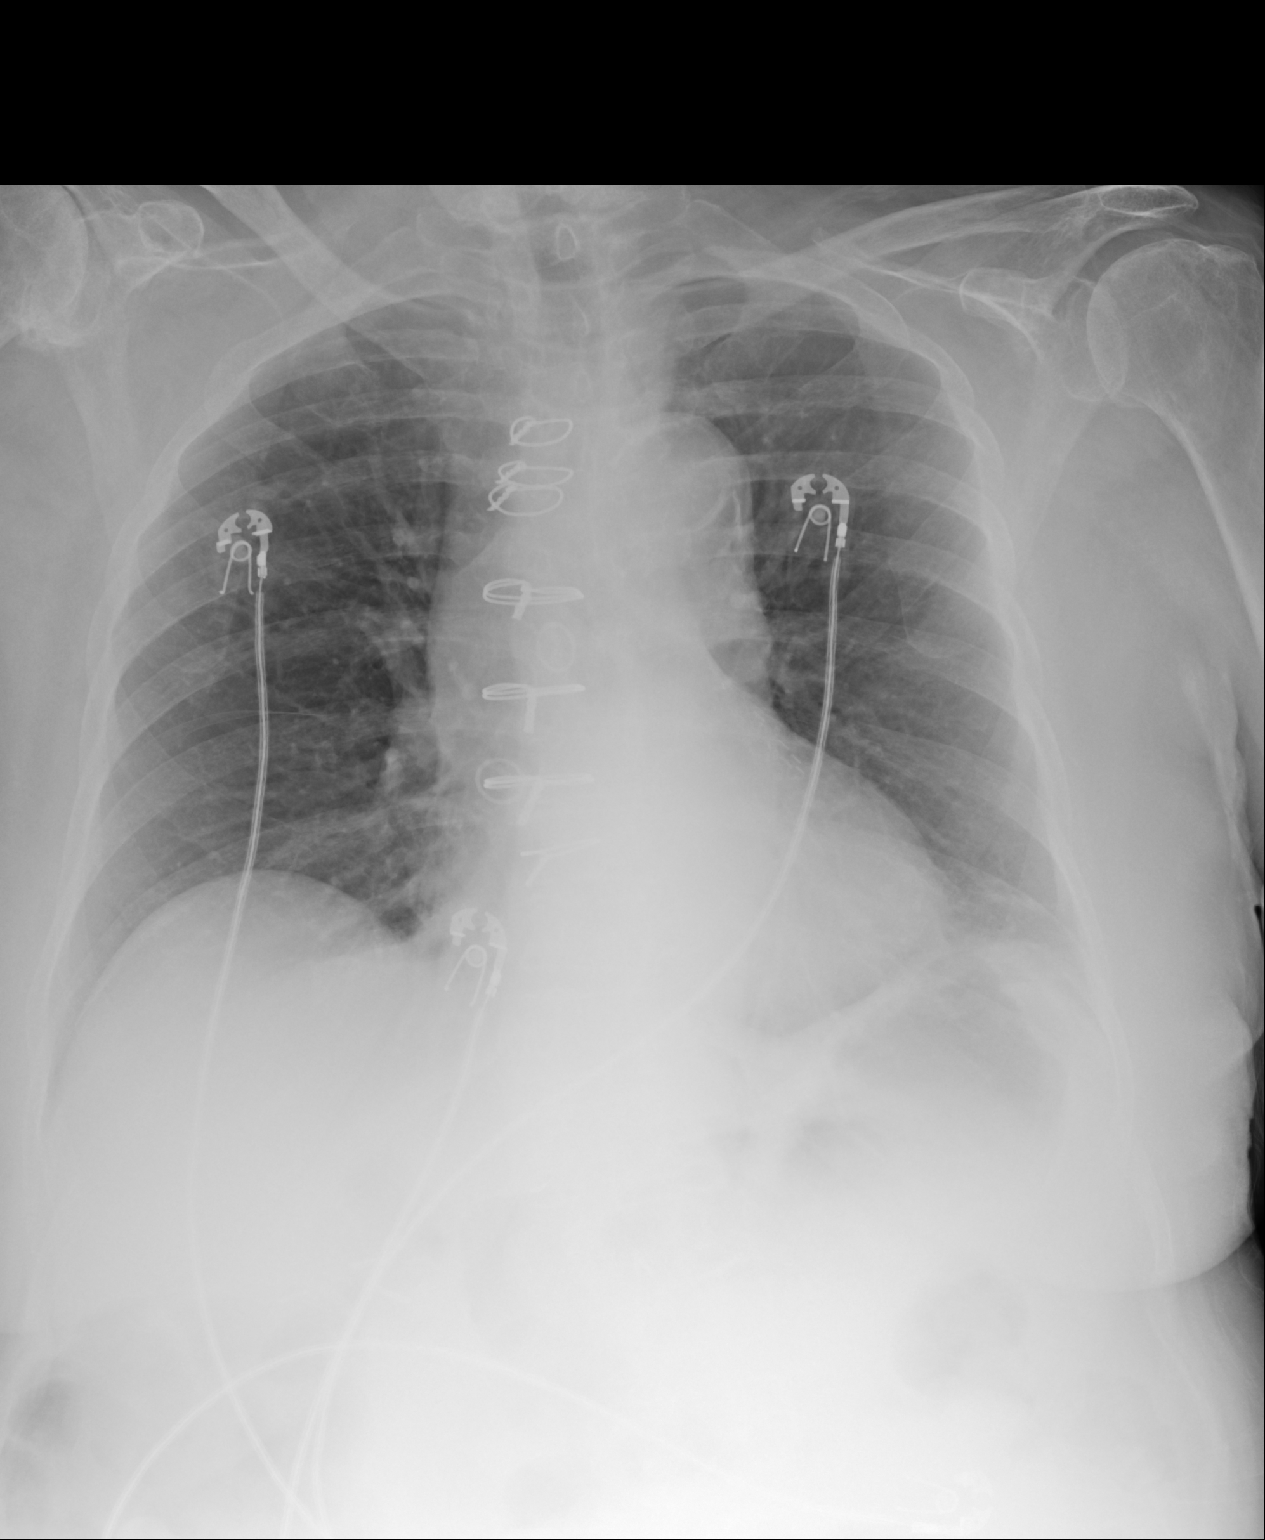

[1 of 1 positions shown; findings below may reference images not displayed]

FINDINGS: There is mild scarring in the left base. The lungs are otherwise
clear. Heart size and pulmonary vascularity are normal. There is
atherosclerotic change in the aorta. Patient is status post coronary
artery bypass grafting. No apparent adenopathy. There is arthropathy
in the right shoulder.
IMPRESSION: No edema or consolidation.

## 2014-03-03 DIAGNOSIS — F29 Unspecified psychosis not due to a substance or known physiological condition: Secondary | ICD-10-CM | POA: Diagnosis not present

## 2014-03-03 DIAGNOSIS — F0391 Unspecified dementia with behavioral disturbance: Secondary | ICD-10-CM | POA: Diagnosis not present

## 2014-03-03 DIAGNOSIS — F03918 Unspecified dementia, unspecified severity, with other behavioral disturbance: Secondary | ICD-10-CM | POA: Diagnosis not present

## 2014-03-03 DIAGNOSIS — F329 Major depressive disorder, single episode, unspecified: Secondary | ICD-10-CM | POA: Diagnosis not present

## 2014-03-03 DIAGNOSIS — F3289 Other specified depressive episodes: Secondary | ICD-10-CM | POA: Diagnosis not present

## 2014-03-04 DIAGNOSIS — W19XXXA Unspecified fall, initial encounter: Secondary | ICD-10-CM | POA: Diagnosis not present

## 2014-03-04 DIAGNOSIS — N390 Urinary tract infection, site not specified: Secondary | ICD-10-CM | POA: Diagnosis not present

## 2014-03-13 DIAGNOSIS — E1059 Type 1 diabetes mellitus with other circulatory complications: Secondary | ICD-10-CM | POA: Diagnosis not present

## 2014-03-14 DIAGNOSIS — I619 Nontraumatic intracerebral hemorrhage, unspecified: Secondary | ICD-10-CM | POA: Diagnosis not present

## 2014-03-14 DIAGNOSIS — M6281 Muscle weakness (generalized): Secondary | ICD-10-CM | POA: Diagnosis not present

## 2014-03-14 DIAGNOSIS — R279 Unspecified lack of coordination: Secondary | ICD-10-CM | POA: Diagnosis not present

## 2014-03-14 DIAGNOSIS — R269 Unspecified abnormalities of gait and mobility: Secondary | ICD-10-CM | POA: Diagnosis not present

## 2014-03-14 DIAGNOSIS — R293 Abnormal posture: Secondary | ICD-10-CM | POA: Diagnosis not present

## 2014-03-26 DIAGNOSIS — E0590 Thyrotoxicosis, unspecified without thyrotoxic crisis or storm: Secondary | ICD-10-CM | POA: Diagnosis not present

## 2014-03-26 DIAGNOSIS — E039 Hypothyroidism, unspecified: Secondary | ICD-10-CM | POA: Diagnosis not present

## 2014-03-31 DIAGNOSIS — F0281 Dementia in other diseases classified elsewhere with behavioral disturbance: Secondary | ICD-10-CM | POA: Diagnosis not present

## 2014-03-31 DIAGNOSIS — F29 Unspecified psychosis not due to a substance or known physiological condition: Secondary | ICD-10-CM | POA: Diagnosis not present

## 2014-03-31 DIAGNOSIS — F329 Major depressive disorder, single episode, unspecified: Secondary | ICD-10-CM | POA: Diagnosis not present

## 2014-04-15 DIAGNOSIS — R279 Unspecified lack of coordination: Secondary | ICD-10-CM | POA: Diagnosis not present

## 2014-04-15 DIAGNOSIS — R269 Unspecified abnormalities of gait and mobility: Secondary | ICD-10-CM | POA: Diagnosis not present

## 2014-04-15 DIAGNOSIS — I619 Nontraumatic intracerebral hemorrhage, unspecified: Secondary | ICD-10-CM | POA: Diagnosis not present

## 2014-04-15 DIAGNOSIS — M6281 Muscle weakness (generalized): Secondary | ICD-10-CM | POA: Diagnosis not present

## 2014-04-15 DIAGNOSIS — R293 Abnormal posture: Secondary | ICD-10-CM | POA: Diagnosis not present

## 2014-05-04 DIAGNOSIS — F328 Other depressive episodes: Secondary | ICD-10-CM | POA: Diagnosis not present

## 2014-05-04 DIAGNOSIS — F29 Unspecified psychosis not due to a substance or known physiological condition: Secondary | ICD-10-CM | POA: Diagnosis not present

## 2014-05-04 DIAGNOSIS — F0391 Unspecified dementia with behavioral disturbance: Secondary | ICD-10-CM | POA: Diagnosis not present

## 2014-05-13 DIAGNOSIS — F29 Unspecified psychosis not due to a substance or known physiological condition: Secondary | ICD-10-CM | POA: Diagnosis not present

## 2014-05-13 DIAGNOSIS — R4182 Altered mental status, unspecified: Secondary | ICD-10-CM | POA: Diagnosis not present

## 2014-05-13 DIAGNOSIS — F419 Anxiety disorder, unspecified: Secondary | ICD-10-CM | POA: Diagnosis not present

## 2014-05-13 DIAGNOSIS — N390 Urinary tract infection, site not specified: Secondary | ICD-10-CM | POA: Diagnosis not present

## 2014-05-13 DIAGNOSIS — F28 Other psychotic disorder not due to a substance or known physiological condition: Secondary | ICD-10-CM | POA: Diagnosis not present

## 2014-05-13 DIAGNOSIS — R21 Rash and other nonspecific skin eruption: Secondary | ICD-10-CM | POA: Diagnosis not present

## 2014-05-13 DIAGNOSIS — E119 Type 2 diabetes mellitus without complications: Secondary | ICD-10-CM | POA: Diagnosis not present

## 2014-05-13 DIAGNOSIS — F060 Psychotic disorder with hallucinations due to known physiological condition: Secondary | ICD-10-CM | POA: Diagnosis not present

## 2014-05-13 DIAGNOSIS — R319 Hematuria, unspecified: Secondary | ICD-10-CM | POA: Diagnosis not present

## 2014-05-14 DIAGNOSIS — E119 Type 2 diabetes mellitus without complications: Secondary | ICD-10-CM | POA: Diagnosis not present

## 2014-05-14 DIAGNOSIS — N390 Urinary tract infection, site not specified: Secondary | ICD-10-CM | POA: Diagnosis not present

## 2014-05-14 DIAGNOSIS — F28 Other psychotic disorder not due to a substance or known physiological condition: Secondary | ICD-10-CM | POA: Diagnosis not present

## 2014-05-14 DIAGNOSIS — R21 Rash and other nonspecific skin eruption: Secondary | ICD-10-CM | POA: Diagnosis not present

## 2014-05-14 DIAGNOSIS — F060 Psychotic disorder with hallucinations due to known physiological condition: Secondary | ICD-10-CM | POA: Diagnosis not present

## 2014-05-14 DIAGNOSIS — F419 Anxiety disorder, unspecified: Secondary | ICD-10-CM | POA: Diagnosis not present

## 2014-05-14 DIAGNOSIS — F29 Unspecified psychosis not due to a substance or known physiological condition: Secondary | ICD-10-CM | POA: Diagnosis not present

## 2014-05-15 DIAGNOSIS — F419 Anxiety disorder, unspecified: Secondary | ICD-10-CM | POA: Diagnosis not present

## 2014-05-15 DIAGNOSIS — F28 Other psychotic disorder not due to a substance or known physiological condition: Secondary | ICD-10-CM | POA: Diagnosis not present

## 2014-05-15 DIAGNOSIS — R21 Rash and other nonspecific skin eruption: Secondary | ICD-10-CM | POA: Diagnosis not present

## 2014-05-15 DIAGNOSIS — E119 Type 2 diabetes mellitus without complications: Secondary | ICD-10-CM | POA: Diagnosis not present

## 2014-05-15 DIAGNOSIS — F060 Psychotic disorder with hallucinations due to known physiological condition: Secondary | ICD-10-CM | POA: Diagnosis not present

## 2014-05-15 DIAGNOSIS — F29 Unspecified psychosis not due to a substance or known physiological condition: Secondary | ICD-10-CM | POA: Diagnosis not present

## 2014-05-15 DIAGNOSIS — N390 Urinary tract infection, site not specified: Secondary | ICD-10-CM | POA: Diagnosis not present

## 2014-05-29 DIAGNOSIS — E1051 Type 1 diabetes mellitus with diabetic peripheral angiopathy without gangrene: Secondary | ICD-10-CM | POA: Diagnosis not present

## 2014-06-02 DIAGNOSIS — F328 Other depressive episodes: Secondary | ICD-10-CM | POA: Diagnosis not present

## 2014-06-02 DIAGNOSIS — F29 Unspecified psychosis not due to a substance or known physiological condition: Secondary | ICD-10-CM | POA: Diagnosis not present

## 2014-06-02 DIAGNOSIS — F0391 Unspecified dementia with behavioral disturbance: Secondary | ICD-10-CM | POA: Diagnosis not present

## 2014-06-19 DIAGNOSIS — E785 Hyperlipidemia, unspecified: Secondary | ICD-10-CM | POA: Diagnosis not present

## 2014-06-19 DIAGNOSIS — E119 Type 2 diabetes mellitus without complications: Secondary | ICD-10-CM | POA: Diagnosis not present

## 2014-06-19 DIAGNOSIS — Z79899 Other long term (current) drug therapy: Secondary | ICD-10-CM | POA: Diagnosis not present

## 2014-06-19 DIAGNOSIS — K219 Gastro-esophageal reflux disease without esophagitis: Secondary | ICD-10-CM | POA: Diagnosis not present

## 2014-06-19 DIAGNOSIS — D649 Anemia, unspecified: Secondary | ICD-10-CM | POA: Diagnosis not present

## 2014-06-26 DIAGNOSIS — K219 Gastro-esophageal reflux disease without esophagitis: Secondary | ICD-10-CM | POA: Diagnosis not present

## 2014-06-26 DIAGNOSIS — K5900 Constipation, unspecified: Secondary | ICD-10-CM | POA: Diagnosis not present

## 2014-06-26 DIAGNOSIS — E46 Unspecified protein-calorie malnutrition: Secondary | ICD-10-CM | POA: Diagnosis not present

## 2014-06-26 DIAGNOSIS — E119 Type 2 diabetes mellitus without complications: Secondary | ICD-10-CM | POA: Diagnosis not present

## 2014-06-29 DIAGNOSIS — R5081 Fever presenting with conditions classified elsewhere: Secondary | ICD-10-CM | POA: Diagnosis not present

## 2014-06-29 DIAGNOSIS — D649 Anemia, unspecified: Secondary | ICD-10-CM | POA: Diagnosis not present

## 2014-06-29 DIAGNOSIS — R319 Hematuria, unspecified: Secondary | ICD-10-CM | POA: Diagnosis not present

## 2014-06-29 DIAGNOSIS — N390 Urinary tract infection, site not specified: Secondary | ICD-10-CM | POA: Diagnosis not present

## 2014-06-29 DIAGNOSIS — Z79899 Other long term (current) drug therapy: Secondary | ICD-10-CM | POA: Diagnosis not present

## 2014-06-30 DIAGNOSIS — F0391 Unspecified dementia with behavioral disturbance: Secondary | ICD-10-CM | POA: Diagnosis not present

## 2014-06-30 DIAGNOSIS — F328 Other depressive episodes: Secondary | ICD-10-CM | POA: Diagnosis not present

## 2014-06-30 DIAGNOSIS — F29 Unspecified psychosis not due to a substance or known physiological condition: Secondary | ICD-10-CM | POA: Diagnosis not present

## 2014-07-01 DIAGNOSIS — N390 Urinary tract infection, site not specified: Secondary | ICD-10-CM | POA: Diagnosis not present

## 2014-07-01 DIAGNOSIS — L271 Localized skin eruption due to drugs and medicaments taken internally: Secondary | ICD-10-CM | POA: Diagnosis not present

## 2014-07-17 DIAGNOSIS — R279 Unspecified lack of coordination: Secondary | ICD-10-CM | POA: Diagnosis not present

## 2014-07-17 DIAGNOSIS — R633 Feeding difficulties: Secondary | ICD-10-CM | POA: Diagnosis not present

## 2014-07-17 DIAGNOSIS — R293 Abnormal posture: Secondary | ICD-10-CM | POA: Diagnosis not present

## 2014-07-17 DIAGNOSIS — R079 Chest pain, unspecified: Secondary | ICD-10-CM | POA: Diagnosis not present

## 2014-07-17 DIAGNOSIS — M6281 Muscle weakness (generalized): Secondary | ICD-10-CM | POA: Diagnosis not present

## 2014-07-17 DIAGNOSIS — R269 Unspecified abnormalities of gait and mobility: Secondary | ICD-10-CM | POA: Diagnosis not present

## 2014-07-18 DIAGNOSIS — R293 Abnormal posture: Secondary | ICD-10-CM | POA: Diagnosis not present

## 2014-07-18 DIAGNOSIS — R269 Unspecified abnormalities of gait and mobility: Secondary | ICD-10-CM | POA: Diagnosis not present

## 2014-07-18 DIAGNOSIS — R279 Unspecified lack of coordination: Secondary | ICD-10-CM | POA: Diagnosis not present

## 2014-07-18 DIAGNOSIS — M6281 Muscle weakness (generalized): Secondary | ICD-10-CM | POA: Diagnosis not present

## 2014-07-18 DIAGNOSIS — R079 Chest pain, unspecified: Secondary | ICD-10-CM | POA: Diagnosis not present

## 2014-07-18 DIAGNOSIS — R633 Feeding difficulties: Secondary | ICD-10-CM | POA: Diagnosis not present

## 2014-07-19 DIAGNOSIS — R269 Unspecified abnormalities of gait and mobility: Secondary | ICD-10-CM | POA: Diagnosis not present

## 2014-07-19 DIAGNOSIS — R079 Chest pain, unspecified: Secondary | ICD-10-CM | POA: Diagnosis not present

## 2014-07-19 DIAGNOSIS — R293 Abnormal posture: Secondary | ICD-10-CM | POA: Diagnosis not present

## 2014-07-19 DIAGNOSIS — R633 Feeding difficulties: Secondary | ICD-10-CM | POA: Diagnosis not present

## 2014-07-19 DIAGNOSIS — R279 Unspecified lack of coordination: Secondary | ICD-10-CM | POA: Diagnosis not present

## 2014-07-19 DIAGNOSIS — M6281 Muscle weakness (generalized): Secondary | ICD-10-CM | POA: Diagnosis not present

## 2014-07-22 DIAGNOSIS — R279 Unspecified lack of coordination: Secondary | ICD-10-CM | POA: Diagnosis not present

## 2014-07-22 DIAGNOSIS — R269 Unspecified abnormalities of gait and mobility: Secondary | ICD-10-CM | POA: Diagnosis not present

## 2014-07-22 DIAGNOSIS — M6281 Muscle weakness (generalized): Secondary | ICD-10-CM | POA: Diagnosis not present

## 2014-07-22 DIAGNOSIS — R079 Chest pain, unspecified: Secondary | ICD-10-CM | POA: Diagnosis not present

## 2014-07-22 DIAGNOSIS — R633 Feeding difficulties: Secondary | ICD-10-CM | POA: Diagnosis not present

## 2014-07-22 DIAGNOSIS — R293 Abnormal posture: Secondary | ICD-10-CM | POA: Diagnosis not present

## 2014-07-23 DIAGNOSIS — R279 Unspecified lack of coordination: Secondary | ICD-10-CM | POA: Diagnosis not present

## 2014-07-23 DIAGNOSIS — R633 Feeding difficulties: Secondary | ICD-10-CM | POA: Diagnosis not present

## 2014-07-23 DIAGNOSIS — R269 Unspecified abnormalities of gait and mobility: Secondary | ICD-10-CM | POA: Diagnosis not present

## 2014-07-23 DIAGNOSIS — M6281 Muscle weakness (generalized): Secondary | ICD-10-CM | POA: Diagnosis not present

## 2014-07-23 DIAGNOSIS — R079 Chest pain, unspecified: Secondary | ICD-10-CM | POA: Diagnosis not present

## 2014-07-23 DIAGNOSIS — R293 Abnormal posture: Secondary | ICD-10-CM | POA: Diagnosis not present

## 2014-07-24 DIAGNOSIS — R293 Abnormal posture: Secondary | ICD-10-CM | POA: Diagnosis not present

## 2014-07-24 DIAGNOSIS — M6281 Muscle weakness (generalized): Secondary | ICD-10-CM | POA: Diagnosis not present

## 2014-07-24 DIAGNOSIS — R269 Unspecified abnormalities of gait and mobility: Secondary | ICD-10-CM | POA: Diagnosis not present

## 2014-07-24 DIAGNOSIS — R079 Chest pain, unspecified: Secondary | ICD-10-CM | POA: Diagnosis not present

## 2014-07-24 DIAGNOSIS — R279 Unspecified lack of coordination: Secondary | ICD-10-CM | POA: Diagnosis not present

## 2014-07-24 DIAGNOSIS — R633 Feeding difficulties: Secondary | ICD-10-CM | POA: Diagnosis not present

## 2014-07-26 DIAGNOSIS — R633 Feeding difficulties: Secondary | ICD-10-CM | POA: Diagnosis not present

## 2014-07-26 DIAGNOSIS — R279 Unspecified lack of coordination: Secondary | ICD-10-CM | POA: Diagnosis not present

## 2014-07-26 DIAGNOSIS — R293 Abnormal posture: Secondary | ICD-10-CM | POA: Diagnosis not present

## 2014-07-26 DIAGNOSIS — R079 Chest pain, unspecified: Secondary | ICD-10-CM | POA: Diagnosis not present

## 2014-07-26 DIAGNOSIS — R269 Unspecified abnormalities of gait and mobility: Secondary | ICD-10-CM | POA: Diagnosis not present

## 2014-07-26 DIAGNOSIS — M6281 Muscle weakness (generalized): Secondary | ICD-10-CM | POA: Diagnosis not present

## 2014-07-27 DIAGNOSIS — J3489 Other specified disorders of nose and nasal sinuses: Secondary | ICD-10-CM | POA: Diagnosis not present

## 2014-07-27 DIAGNOSIS — R21 Rash and other nonspecific skin eruption: Secondary | ICD-10-CM | POA: Diagnosis not present

## 2014-07-28 DIAGNOSIS — R633 Feeding difficulties: Secondary | ICD-10-CM | POA: Diagnosis not present

## 2014-07-28 DIAGNOSIS — R279 Unspecified lack of coordination: Secondary | ICD-10-CM | POA: Diagnosis not present

## 2014-07-28 DIAGNOSIS — R293 Abnormal posture: Secondary | ICD-10-CM | POA: Diagnosis not present

## 2014-07-28 DIAGNOSIS — R079 Chest pain, unspecified: Secondary | ICD-10-CM | POA: Diagnosis not present

## 2014-07-28 DIAGNOSIS — M6281 Muscle weakness (generalized): Secondary | ICD-10-CM | POA: Diagnosis not present

## 2014-07-28 DIAGNOSIS — R269 Unspecified abnormalities of gait and mobility: Secondary | ICD-10-CM | POA: Diagnosis not present

## 2014-07-29 DIAGNOSIS — R079 Chest pain, unspecified: Secondary | ICD-10-CM | POA: Diagnosis not present

## 2014-07-29 DIAGNOSIS — R633 Feeding difficulties: Secondary | ICD-10-CM | POA: Diagnosis not present

## 2014-07-29 DIAGNOSIS — M6281 Muscle weakness (generalized): Secondary | ICD-10-CM | POA: Diagnosis not present

## 2014-07-29 DIAGNOSIS — R269 Unspecified abnormalities of gait and mobility: Secondary | ICD-10-CM | POA: Diagnosis not present

## 2014-07-29 DIAGNOSIS — R279 Unspecified lack of coordination: Secondary | ICD-10-CM | POA: Diagnosis not present

## 2014-07-29 DIAGNOSIS — R293 Abnormal posture: Secondary | ICD-10-CM | POA: Diagnosis not present

## 2014-07-31 DIAGNOSIS — M6281 Muscle weakness (generalized): Secondary | ICD-10-CM | POA: Diagnosis not present

## 2014-07-31 DIAGNOSIS — R279 Unspecified lack of coordination: Secondary | ICD-10-CM | POA: Diagnosis not present

## 2014-07-31 DIAGNOSIS — R293 Abnormal posture: Secondary | ICD-10-CM | POA: Diagnosis not present

## 2014-07-31 DIAGNOSIS — R269 Unspecified abnormalities of gait and mobility: Secondary | ICD-10-CM | POA: Diagnosis not present

## 2014-07-31 DIAGNOSIS — R633 Feeding difficulties: Secondary | ICD-10-CM | POA: Diagnosis not present

## 2014-07-31 DIAGNOSIS — R110 Nausea: Secondary | ICD-10-CM | POA: Diagnosis not present

## 2014-07-31 DIAGNOSIS — R079 Chest pain, unspecified: Secondary | ICD-10-CM | POA: Diagnosis not present

## 2014-08-01 DIAGNOSIS — R079 Chest pain, unspecified: Secondary | ICD-10-CM | POA: Diagnosis not present

## 2014-08-01 DIAGNOSIS — M6281 Muscle weakness (generalized): Secondary | ICD-10-CM | POA: Diagnosis not present

## 2014-08-01 DIAGNOSIS — Z79899 Other long term (current) drug therapy: Secondary | ICD-10-CM | POA: Diagnosis not present

## 2014-08-01 DIAGNOSIS — R269 Unspecified abnormalities of gait and mobility: Secondary | ICD-10-CM | POA: Diagnosis not present

## 2014-08-01 DIAGNOSIS — R293 Abnormal posture: Secondary | ICD-10-CM | POA: Diagnosis not present

## 2014-08-01 DIAGNOSIS — R633 Feeding difficulties: Secondary | ICD-10-CM | POA: Diagnosis not present

## 2014-08-01 DIAGNOSIS — R279 Unspecified lack of coordination: Secondary | ICD-10-CM | POA: Diagnosis not present

## 2014-08-01 DIAGNOSIS — R110 Nausea: Secondary | ICD-10-CM | POA: Diagnosis not present

## 2014-08-01 DIAGNOSIS — D649 Anemia, unspecified: Secondary | ICD-10-CM | POA: Diagnosis not present

## 2014-08-01 DIAGNOSIS — K219 Gastro-esophageal reflux disease without esophagitis: Secondary | ICD-10-CM | POA: Diagnosis not present

## 2014-08-02 DIAGNOSIS — R079 Chest pain, unspecified: Secondary | ICD-10-CM | POA: Diagnosis not present

## 2014-08-02 DIAGNOSIS — M6281 Muscle weakness (generalized): Secondary | ICD-10-CM | POA: Diagnosis not present

## 2014-08-02 DIAGNOSIS — R269 Unspecified abnormalities of gait and mobility: Secondary | ICD-10-CM | POA: Diagnosis not present

## 2014-08-02 DIAGNOSIS — R633 Feeding difficulties: Secondary | ICD-10-CM | POA: Diagnosis not present

## 2014-08-02 DIAGNOSIS — R279 Unspecified lack of coordination: Secondary | ICD-10-CM | POA: Diagnosis not present

## 2014-08-02 DIAGNOSIS — R293 Abnormal posture: Secondary | ICD-10-CM | POA: Diagnosis not present

## 2014-08-04 DIAGNOSIS — F0391 Unspecified dementia with behavioral disturbance: Secondary | ICD-10-CM | POA: Diagnosis not present

## 2014-08-04 DIAGNOSIS — F328 Other depressive episodes: Secondary | ICD-10-CM | POA: Diagnosis not present

## 2014-08-04 DIAGNOSIS — F29 Unspecified psychosis not due to a substance or known physiological condition: Secondary | ICD-10-CM | POA: Diagnosis not present

## 2014-08-05 DIAGNOSIS — M6281 Muscle weakness (generalized): Secondary | ICD-10-CM | POA: Diagnosis not present

## 2014-08-05 DIAGNOSIS — R279 Unspecified lack of coordination: Secondary | ICD-10-CM | POA: Diagnosis not present

## 2014-08-05 DIAGNOSIS — R079 Chest pain, unspecified: Secondary | ICD-10-CM | POA: Diagnosis not present

## 2014-08-05 DIAGNOSIS — R633 Feeding difficulties: Secondary | ICD-10-CM | POA: Diagnosis not present

## 2014-08-05 DIAGNOSIS — R269 Unspecified abnormalities of gait and mobility: Secondary | ICD-10-CM | POA: Diagnosis not present

## 2014-08-05 DIAGNOSIS — R293 Abnormal posture: Secondary | ICD-10-CM | POA: Diagnosis not present

## 2014-08-06 DIAGNOSIS — R633 Feeding difficulties: Secondary | ICD-10-CM | POA: Diagnosis not present

## 2014-08-06 DIAGNOSIS — M6281 Muscle weakness (generalized): Secondary | ICD-10-CM | POA: Diagnosis not present

## 2014-08-06 DIAGNOSIS — R279 Unspecified lack of coordination: Secondary | ICD-10-CM | POA: Diagnosis not present

## 2014-08-06 DIAGNOSIS — R269 Unspecified abnormalities of gait and mobility: Secondary | ICD-10-CM | POA: Diagnosis not present

## 2014-08-06 DIAGNOSIS — R293 Abnormal posture: Secondary | ICD-10-CM | POA: Diagnosis not present

## 2014-08-06 DIAGNOSIS — R079 Chest pain, unspecified: Secondary | ICD-10-CM | POA: Diagnosis not present

## 2014-08-07 DIAGNOSIS — M6281 Muscle weakness (generalized): Secondary | ICD-10-CM | POA: Diagnosis not present

## 2014-08-07 DIAGNOSIS — R633 Feeding difficulties: Secondary | ICD-10-CM | POA: Diagnosis not present

## 2014-08-07 DIAGNOSIS — R293 Abnormal posture: Secondary | ICD-10-CM | POA: Diagnosis not present

## 2014-08-07 DIAGNOSIS — R079 Chest pain, unspecified: Secondary | ICD-10-CM | POA: Diagnosis not present

## 2014-08-07 DIAGNOSIS — R269 Unspecified abnormalities of gait and mobility: Secondary | ICD-10-CM | POA: Diagnosis not present

## 2014-08-07 DIAGNOSIS — R279 Unspecified lack of coordination: Secondary | ICD-10-CM | POA: Diagnosis not present

## 2014-08-08 ENCOUNTER — Observation Stay (HOSPITAL_COMMUNITY)
Admission: EM | Admit: 2014-08-08 | Discharge: 2014-08-09 | Disposition: A | Discharge status: Home / Self Care | Payer: Medicare Other | Attending: Internal Medicine | Admitting: Internal Medicine

## 2014-08-08 ENCOUNTER — Encounter (HOSPITAL_COMMUNITY): Payer: Self-pay | Admitting: Emergency Medicine

## 2014-08-08 ENCOUNTER — Emergency Department (HOSPITAL_COMMUNITY): Payer: Medicare Other

## 2014-08-08 DIAGNOSIS — R293 Abnormal posture: Secondary | ICD-10-CM | POA: Diagnosis not present

## 2014-08-08 DIAGNOSIS — E119 Type 2 diabetes mellitus without complications: Secondary | ICD-10-CM | POA: Insufficient documentation

## 2014-08-08 DIAGNOSIS — R079 Chest pain, unspecified: Secondary | ICD-10-CM | POA: Diagnosis not present

## 2014-08-08 DIAGNOSIS — F0390 Unspecified dementia without behavioral disturbance: Secondary | ICD-10-CM | POA: Diagnosis not present

## 2014-08-08 DIAGNOSIS — Z7902 Long term (current) use of antithrombotics/antiplatelets: Secondary | ICD-10-CM | POA: Diagnosis not present

## 2014-08-08 DIAGNOSIS — Z7952 Long term (current) use of systemic steroids: Secondary | ICD-10-CM | POA: Insufficient documentation

## 2014-08-08 DIAGNOSIS — Z8673 Personal history of transient ischemic attack (TIA), and cerebral infarction without residual deficits: Secondary | ICD-10-CM | POA: Diagnosis not present

## 2014-08-08 DIAGNOSIS — Z791 Long term (current) use of non-steroidal anti-inflammatories (NSAID): Secondary | ICD-10-CM | POA: Diagnosis not present

## 2014-08-08 DIAGNOSIS — E780 Pure hypercholesterolemia: Secondary | ICD-10-CM | POA: Diagnosis not present

## 2014-08-08 DIAGNOSIS — Z79899 Other long term (current) drug therapy: Secondary | ICD-10-CM | POA: Diagnosis not present

## 2014-08-08 DIAGNOSIS — M1990 Unspecified osteoarthritis, unspecified site: Secondary | ICD-10-CM | POA: Diagnosis not present

## 2014-08-08 DIAGNOSIS — I2510 Atherosclerotic heart disease of native coronary artery without angina pectoris: Secondary | ICD-10-CM | POA: Diagnosis not present

## 2014-08-08 DIAGNOSIS — I10 Essential (primary) hypertension: Secondary | ICD-10-CM | POA: Diagnosis not present

## 2014-08-08 DIAGNOSIS — E039 Hypothyroidism, unspecified: Secondary | ICD-10-CM | POA: Insufficient documentation

## 2014-08-08 DIAGNOSIS — Z91040 Latex allergy status: Secondary | ICD-10-CM | POA: Insufficient documentation

## 2014-08-08 DIAGNOSIS — K219 Gastro-esophageal reflux disease without esophagitis: Secondary | ICD-10-CM | POA: Insufficient documentation

## 2014-08-08 DIAGNOSIS — R071 Chest pain on breathing: Secondary | ICD-10-CM | POA: Diagnosis not present

## 2014-08-08 DIAGNOSIS — Z8711 Personal history of peptic ulcer disease: Secondary | ICD-10-CM | POA: Insufficient documentation

## 2014-08-08 DIAGNOSIS — M6281 Muscle weakness (generalized): Secondary | ICD-10-CM | POA: Diagnosis not present

## 2014-08-08 DIAGNOSIS — R633 Feeding difficulties: Secondary | ICD-10-CM | POA: Diagnosis not present

## 2014-08-08 DIAGNOSIS — R030 Elevated blood-pressure reading, without diagnosis of hypertension: Secondary | ICD-10-CM | POA: Diagnosis not present

## 2014-08-08 DIAGNOSIS — I25810 Atherosclerosis of coronary artery bypass graft(s) without angina pectoris: Secondary | ICD-10-CM | POA: Diagnosis present

## 2014-08-08 DIAGNOSIS — Z794 Long term (current) use of insulin: Secondary | ICD-10-CM | POA: Diagnosis not present

## 2014-08-08 DIAGNOSIS — Z951 Presence of aortocoronary bypass graft: Secondary | ICD-10-CM | POA: Diagnosis not present

## 2014-08-08 DIAGNOSIS — R0602 Shortness of breath: Secondary | ICD-10-CM | POA: Diagnosis not present

## 2014-08-08 DIAGNOSIS — R269 Unspecified abnormalities of gait and mobility: Secondary | ICD-10-CM | POA: Diagnosis not present

## 2014-08-08 DIAGNOSIS — R279 Unspecified lack of coordination: Secondary | ICD-10-CM | POA: Diagnosis not present

## 2014-08-08 LAB — BASIC METABOLIC PANEL
ANION GAP: 7 (ref 5–15)
BUN: 15 mg/dL (ref 6–23)
CALCIUM: 9.3 mg/dL (ref 8.4–10.5)
CO2: 30 mmol/L (ref 19–32)
Chloride: 100 mmol/L (ref 96–112)
Creatinine, Ser: 1 mg/dL (ref 0.50–1.10)
GFR, EST AFRICAN AMERICAN: 58 mL/min — AB (ref >90)
GFR, EST NON AFRICAN AMERICAN: 50 mL/min — AB (ref >90)
GLUCOSE: 159 mg/dL — AB (ref 70–99)
POTASSIUM: 4.4 mmol/L (ref 3.5–5.1)
Sodium: 137 mmol/L (ref 135–145)

## 2014-08-08 LAB — CBC
HCT: 45.2 % (ref 36.0–46.0)
Hemoglobin: 14.7 g/dL (ref 12.0–15.0)
MCH: 28.7 pg (ref 26.0–34.0)
MCHC: 32.5 g/dL (ref 30.0–36.0)
MCV: 88.3 fL (ref 78.0–100.0)
PLATELETS: 266 10*3/uL (ref 150–400)
RBC: 5.12 MIL/uL — AB (ref 3.87–5.11)
RDW: 15 % (ref 11.5–15.5)
WBC: 13 10*3/uL — AB (ref 4.0–10.5)

## 2014-08-08 LAB — TROPONIN I
TROPONIN I: 0.05 ng/mL — AB (ref <0.031)
TROPONIN I: 0.05 ng/mL — AB (ref <0.031)
Troponin I: 0.06 ng/mL — ABNORMAL HIGH (ref <0.031)
Troponin I: 0.06 ng/mL — ABNORMAL HIGH (ref <0.031)

## 2014-08-08 LAB — GLUCOSE, CAPILLARY
GLUCOSE-CAPILLARY: 126 mg/dL — AB (ref 70–99)
Glucose-Capillary: 194 mg/dL — ABNORMAL HIGH (ref 70–99)

## 2014-08-08 MED ORDER — ONDANSETRON HCL 4 MG/2ML IJ SOLN: 4.0000 mg | Freq: Four times a day (QID) | INTRAMUSCULAR | Status: DC | PRN

## 2014-08-08 MED ORDER — HYDROCORTISONE BUTYRATE 0.1 % EX CREA: 1.0000 "application " | TOPICAL_CREAM | Freq: Every day | CUTANEOUS | Status: DC

## 2014-08-08 MED ORDER — HEPARIN SODIUM (PORCINE) 5000 UNIT/ML IJ SOLN
5000.0000 [IU] | Freq: Three times a day (TID) | INTRAMUSCULAR | Status: DC
  Administered 2014-08-08 (×2): 5000 [IU] via SUBCUTANEOUS
  Filled 2014-08-08 (×2): qty 1

## 2014-08-08 MED ORDER — PRO-STAT SUGAR FREE PO LIQD
30.0000 mL | Freq: Every day | ORAL | Status: DC
  Administered 2014-08-09: 30 mL via ORAL
  Filled 2014-08-08: qty 30

## 2014-08-08 MED ORDER — BENEFIBER PO POWD: 5.0000 mL | Freq: Every day | ORAL | Status: DC

## 2014-08-08 MED ORDER — TRAMADOL HCL 50 MG PO TABS: 25.0000 mg | ORAL_TABLET | Freq: Four times a day (QID) | ORAL | Status: DC | PRN

## 2014-08-08 MED ORDER — LORAZEPAM 0.5 MG PO TABS: 0.5000 mg | ORAL_TABLET | Freq: Every day | ORAL | Status: DC | PRN

## 2014-08-08 MED ORDER — PANTOPRAZOLE SODIUM 40 MG PO TBEC
40.0000 mg | DELAYED_RELEASE_TABLET | Freq: Every day | ORAL | Status: DC
  Administered 2014-08-08 – 2014-08-09 (×2): 40 mg via ORAL
  Filled 2014-08-08 (×2): qty 1

## 2014-08-08 MED ORDER — ACETAMINOPHEN 325 MG PO TABS: 650.0000 mg | ORAL_TABLET | ORAL | Status: DC | PRN

## 2014-08-08 MED ORDER — ADULT MULTIVITAMIN W/MINERALS CH
1.0000 | ORAL_TABLET | Freq: Every day | ORAL | Status: DC
Start: 2014-08-09 — End: 2014-08-09
  Administered 2014-08-09: 1 via ORAL
  Filled 2014-08-08: qty 1

## 2014-08-08 MED ORDER — PHENYLEPHRINE IN HARD FAT 0.25 % RE SUPP
1.0000 | Freq: Every day | RECTAL | Status: DC
  Filled 2014-08-08 (×4): qty 1

## 2014-08-08 MED ORDER — POLYETHYLENE GLYCOL 3350 17 G PO PACK
17.0000 g | PACK | Freq: Every day | ORAL | Status: DC
  Administered 2014-08-09: 17 g via ORAL
  Filled 2014-08-08: qty 1

## 2014-08-08 MED ORDER — CALCIUM CARBONATE 1250 (500 CA) MG PO TABS
1.0000 | ORAL_TABLET | Freq: Two times a day (BID) | ORAL | Status: DC
Start: 2014-08-08 — End: 2014-08-09
  Administered 2014-08-08 – 2014-08-09 (×2): 500 mg via ORAL
  Filled 2014-08-08 (×2): qty 1

## 2014-08-08 MED ORDER — CLOPIDOGREL BISULFATE 75 MG PO TABS
75.0000 mg | ORAL_TABLET | Freq: Every day | ORAL | Status: DC
  Administered 2014-08-09: 75 mg via ORAL
  Filled 2014-08-08: qty 1

## 2014-08-08 MED ORDER — METFORMIN HCL 500 MG PO TABS
500.0000 mg | ORAL_TABLET | Freq: Every day | ORAL | Status: DC
  Administered 2014-08-08 – 2014-08-09 (×2): 500 mg via ORAL
  Filled 2014-08-08 (×2): qty 1

## 2014-08-08 MED ORDER — HYDROCERIN EX CREA
TOPICAL_CREAM | Freq: Two times a day (BID) | CUTANEOUS | Status: DC
  Administered 2014-08-08 – 2014-08-09 (×2): via TOPICAL
  Filled 2014-08-08: qty 113

## 2014-08-08 MED ORDER — FISH OIL 1000 MG PO CAPS: 1000.0000 mg | ORAL_CAPSULE | Freq: Every day | ORAL | Status: DC

## 2014-08-08 MED ORDER — ACETAMINOPHEN 325 MG PO TABS: 325.0000 mg | ORAL_TABLET | Freq: Three times a day (TID) | ORAL | Status: DC | PRN

## 2014-08-08 MED ORDER — DICLOFENAC SODIUM 1 % TD GEL
2.0000 g | Freq: Every day | TRANSDERMAL | Status: DC
  Administered 2014-08-09: 2 g via TOPICAL
  Filled 2014-08-08: qty 100

## 2014-08-08 MED ORDER — CRANBERRY 475 MG PO CAPS: 475.0000 mg | ORAL_CAPSULE | Freq: Every day | ORAL | Status: DC

## 2014-08-08 MED ORDER — INSULIN ASPART 100 UNIT/ML ~~LOC~~ SOLN
0.0000 [IU] | Freq: Three times a day (TID) | SUBCUTANEOUS | Status: DC
  Administered 2014-08-09: 2 [IU] via SUBCUTANEOUS

## 2014-08-08 MED ORDER — NITROGLYCERIN 0.4 MG SL SUBL: 0.4000 mg | SUBLINGUAL_TABLET | SUBLINGUAL | Status: DC | PRN

## 2014-08-08 MED ORDER — PREDNISONE 10 MG PO TABS
5.0000 mg | ORAL_TABLET | Freq: Every day | ORAL | Status: DC
  Administered 2014-08-08 – 2014-08-09 (×2): 5 mg via ORAL
  Filled 2014-08-08 (×2): qty 1

## 2014-08-08 MED ORDER — HYDROCORTISONE 1 % EX CREA
TOPICAL_CREAM | Freq: Every day | CUTANEOUS | Status: DC
  Administered 2014-08-09: 1 via TOPICAL
  Filled 2014-08-08: qty 1.5

## 2014-08-08 MED ORDER — BENEPROTEIN PO POWD
1.0000 | Freq: Every day | ORAL | Status: DC
  Filled 2014-08-08: qty 227

## 2014-08-08 MED ORDER — OMEGA-3-ACID ETHYL ESTERS 1 G PO CAPS
1.0000 g | ORAL_CAPSULE | Freq: Every day | ORAL | Status: DC
  Administered 2014-08-09: 1 g via ORAL
  Filled 2014-08-08: qty 1

## 2014-08-08 MED ORDER — ISOSORBIDE MONONITRATE ER 30 MG PO TB24
30.0000 mg | ORAL_TABLET | Freq: Every day | ORAL | Status: DC
  Administered 2014-08-09: 30 mg via ORAL
  Filled 2014-08-08: qty 1

## 2014-08-08 MED ORDER — INSULIN GLARGINE 100 UNIT/ML ~~LOC~~ SOLN
20.0000 [IU] | Freq: Every day | SUBCUTANEOUS | Status: DC
  Administered 2014-08-08: 20 [IU] via SUBCUTANEOUS
  Filled 2014-08-08 (×4): qty 0.2

## 2014-08-08 MED ORDER — ASPIRIN 81 MG PO CHEW
324.0000 mg | CHEWABLE_TABLET | Freq: Once | ORAL | Status: AC
  Administered 2014-08-08: 243 mg via ORAL
  Filled 2014-08-08: qty 4

## 2014-08-08 MED ORDER — INSULIN ASPART 100 UNIT/ML ~~LOC~~ SOLN: 0.0000 [IU] | Freq: Every day | SUBCUTANEOUS | Status: DC

## 2014-08-08 MED ORDER — POLYETHYLENE GLYCOL 3350 17 GM/SCOOP PO POWD: 17.0000 g | Freq: Every day | ORAL | Status: DC

## 2014-08-08 MED ORDER — POLYVINYL ALCOHOL-POVIDONE 2.7-2 % OP SOLN: 1.0000 [drp] | Freq: Three times a day (TID) | OPHTHALMIC | Status: DC

## 2014-08-08 MED ORDER — CALCIUM 500 MG PO TABS: 600.0000 mg | ORAL_TABLET | Freq: Two times a day (BID) | ORAL | Status: DC

## 2014-08-08 MED ORDER — SIMVASTATIN 20 MG PO TABS
20.0000 mg | ORAL_TABLET | Freq: Every day | ORAL | Status: DC
  Administered 2014-08-08: 20 mg via ORAL
  Filled 2014-08-08: qty 1

## 2014-08-08 MED ORDER — POLYVINYL ALCOHOL 1.4 % OP SOLN
1.0000 [drp] | Freq: Three times a day (TID) | OPHTHALMIC | Status: DC
  Administered 2014-08-08 – 2014-08-09 (×3): 1 [drp] via OPHTHALMIC
  Filled 2014-08-08: qty 15

## 2014-08-08 MED ORDER — HALOPERIDOL 0.5 MG PO TABS
0.5000 mg | ORAL_TABLET | Freq: Two times a day (BID) | ORAL | Status: DC
  Administered 2014-08-08: 0.5 mg via ORAL
  Filled 2014-08-08 (×8): qty 1

## 2014-08-08 MED ORDER — LISINOPRIL 10 MG PO TABS
10.0000 mg | ORAL_TABLET | Freq: Every day | ORAL | Status: DC
  Administered 2014-08-09: 10 mg via ORAL
  Filled 2014-08-08: qty 1

## 2014-08-08 MED ORDER — VANICREAM EX CREA: TOPICAL_CREAM | Freq: Two times a day (BID) | CUTANEOUS | Status: DC

## 2014-08-08 MED ORDER — SERTRALINE HCL 50 MG PO TABS
75.0000 mg | ORAL_TABLET | Freq: Every day | ORAL | Status: DC
  Administered 2014-08-09: 75 mg via ORAL
  Filled 2014-08-08: qty 2

## 2014-08-08 MED ORDER — LEVOTHYROXINE SODIUM 112 MCG PO TABS
112.0000 ug | ORAL_TABLET | Freq: Every day | ORAL | Status: DC
  Administered 2014-08-09: 112 ug via ORAL
  Filled 2014-08-08: qty 1

## 2014-08-08 NOTE — ED Notes (Signed)
From [facility] via [facility], reported CP during PT, given 1 nitro and 81 mg ASA pta, no pain on arrival, no resp dis or other complaints, alert, oriented to baseline, VSS, NAD

## 2014-08-08 NOTE — ED Provider Notes (Signed)
CSN: [id number]     Arrival date & time 08/08/14  1302 History   First MD Initiated Contact with Patient 08/08/14 1412     Chief Complaint  Patient presents with  . Chest Pain     (Consider location/radiation/quality/duration/timing/severity/associated sxs/prior Treatment) HPI  81 year old female presents with chest pain that started during physical therapy. She was given one nitroglycerin and 81 mg aspirin and now has no pain. She endorses chest and back pain during that time as well as shortness of breath, nausea. Patient denies prior pain like this before. She has a history of a CABG. The patient is a poor historian based on prior stroke and intermittent memory impairment. Patient does not currently have a cardiologist. Patient has recently had some intermittent infusion that resolved recently. Primary history is from family at bedside and EMS.  Past Medical History  Diagnosis Date  . Pure hypercholesterolemia   . Type II or unspecified type diabetes mellitus without mention of complication, not stated as uncontrolled   . Unspecified essential hypertension   . Unspecified transient cerebral ischemia   . Coronary atherosclerosis of native coronary artery     four-vessel CABG, 7/06, ... normal Cardiolite; EF 79%, 8/10  . PUD (peptic ulcer disease)   . Osteoarthritis   . GERD (gastroesophageal reflux disease)   . Hypothyroidism 06/14/2009  . Arthritis   . GIB (gastrointestinal bleeding)     history of  . Stromal tumor of the stomach     history of. partial gastrectomy 2001   Past Surgical History  Procedure Laterality Date  . Coronary artery bypass graft      . four-vessel CABG, 7/06, ... normal Cardiolite; EF 79%, 8/10  . Partial thyroid surgery    . Cataract extraction    . Partial gastrectomy  [facility]  . Abdominal hysterectomy    . Gallbladder resection     Family History  Problem Relation Age of Onset  . Heart attack Brother 75  . Coronary artery  disease    . Hypertension     History  Substance Use Topics  . Smoking status: Never Smoker   . Smokeless tobacco: Never Used  . Alcohol Use: No   OB History    No data available     Review of Systems  Unable to perform ROS: Dementia      Allergies  Codeine; Contrast media; Sulfa antibiotics; Guaifenesin & derivatives; Iodine; Adhesive; and Latex  Home Medications   Prior to Admission medications   Medication Sig Start Date End Date Taking? Authorizing Provider  acetaminophen (TYLENOL) 325 MG tablet Take 325 mg by mouth every 8 (eight) hours as needed. For pain    Historical Provider, MD  Calcium 500 MG tablet Take 600 mg by mouth 2 (two) times daily.    Historical Provider, MD  clopidogrel (PLAVIX) 75 MG tablet Take 75 mg by mouth daily.      Historical Provider, MD  Cranberry 475 MG CAPS Take 475 mg by mouth daily.    Historical Provider, MD  diclofenac sodium (VOLTAREN) 1 % GEL Apply 2 g topically daily. *applied to right shoulder for pain*    Historical Provider, MD  Emollient [facility] EX) Apply 1 application topically 2 (two) times daily.    Historical Provider, MD  haloperidol (HALDOL) 0.5 MG tablet Take 0.5 mg by mouth 2 (two) times daily.     Historical Provider, MD  hydrocortisone butyrate (LOCOID) 0.1 % CREA cream Apply 1 application topically  daily. *applied to face for rash around mouth*    Historical Provider, MD  insulin glargine (LANTUS) 100 UNIT/ML injection Inject 20 Units into the skin at bedtime.     Historical Provider, MD  isosorbide mononitrate (IMDUR) 30 MG 24 hr tablet Take 30 mg by mouth daily.    Historical Provider, MD  levothyroxine (SYNTHROID, LEVOTHROID) 112 MCG tablet Take 112 mcg by mouth daily before breakfast.    Historical Provider, MD  lisinopril (PRINIVIL,ZESTRIL) 10 MG tablet Take 10 mg by mouth daily.     Historical Provider, MD  LORazepam (ATIVAN) 0.5 MG tablet Take 0.5 mg by mouth daily as needed for anxiety.    Historical Provider, MD   metFORMIN (GLUCOPHAGE) 500 MG tablet Take 500 mg by mouth daily.    Historical Provider, MD  Multiple Vitamins-Minerals (CENTRUM SILVER PO) Take 1 tablet by mouth daily.     Historical Provider, MD  nitroGLYCERIN (NITROSTAT) 0.4 MG SL tablet Place 1 tablet (0.4 mg total) under the tongue every 5 (five) minutes as needed. 09/15/10   Ezra Sites, MD  Omega-3 Fatty Acids (FISH OIL) 1000 MG CAPS Take 1,000 mg by mouth daily.    Historical Provider, MD  omeprazole (PRILOSEC) 20 MG capsule Take 20 mg by mouth 2 (two) times daily.    Historical Provider, MD  polyethylene glycol powder (GLYCOLAX/MIRALAX) powder Take 17 g by mouth daily.    Historical Provider, MD  Polyvinyl Alcohol-Povidone (FRESHKOTE) 2.7-2 % SOLN Apply 1 drop to eye 3 (three) times daily.    Historical Provider, MD  predniSONE (DELTASONE) 5 MG tablet Take 5 mg by mouth daily.    Historical Provider, MD  sertraline (ZOLOFT) 50 MG tablet Take 75 mg by mouth daily.    Historical Provider, MD  shark liver oil-cocoa butter (PREPARATION H) 0.25-3-85.5 % suppository Place 1 suppository rectally at bedtime.    Historical Provider, MD  simvastatin (ZOCOR) 20 MG tablet Take 20 mg by mouth at bedtime.      Historical Provider, MD  traMADol (ULTRAM) 50 MG tablet Take 0.5 tablets (25 mg total) by mouth every 6 (six) hours as needed. 02/22/14   Orpah Greek, MD  Wheat Dextrin (BENEFIBER) POWD Take 5 mLs by mouth daily.    Historical Provider, MD   BP 111/70 mmHg  Pulse 95  Temp(Src) 97.7 F (36.5 C) (Oral)  Resp 19  Ht 5\' 4"  (1.626 m)  Wt 135 lb (61.236 kg)  BMI 23.16 kg/m2  SpO2 97% Physical Exam  Constitutional: She is oriented to person, place, and time. She appears well-developed and well-nourished.  HENT:  Head: Normocephalic and atraumatic.  Right Ear: External ear normal.  Left Ear: External ear normal.  Nose: Nose normal.  Eyes: Right eye exhibits no discharge. Left eye exhibits no discharge.  Cardiovascular: Normal  rate, regular rhythm and normal heart sounds.   Pulmonary/Chest: Effort normal and breath sounds normal.  Abdominal: Soft. There is no tenderness.  Neurological: She is alert and oriented to person, place, and time.  Skin: Skin is warm and dry. She is not diaphoretic.  Nursing note and vitals reviewed.   ED Course  Procedures (including critical care time) Labs Review Labs Reviewed  CBC - Abnormal; Notable for the following:    WBC 13.0 (*)    RBC 5.12 (*)    All other components within normal limits  BASIC METABOLIC PANEL - Abnormal; Notable for the following:    Glucose, Bld 159 (*)  GFR calc non Af Amer 50 (*)    GFR calc Af Amer 58 (*)    All other components within normal limits  TROPONIN I - Abnormal; Notable for the following:    Troponin I 0.05 (*)    All other components within normal limits    Imaging Review Dg Chest Port 1 View  08/08/2014   CLINICAL DATA:  Chest pain.  EXAM: PORTABLE CHEST - 1 VIEW  COMPARISON:  One-view chest 03/07/2013  FINDINGS: The heart size is normal. Atherosclerotic calcifications are present within the aorta. The lung volumes are low. No focal airspace disease is present. There is no edema or effusion to suggest failure. Mild interstitial coarsening is chronic. Degenerative changes are noted at the [facility] joints bilaterally. The visualized soft tissues and bony thorax are otherwise unremarkable.  IMPRESSION: 1. No acute cardiopulmonary disease or significant interval change. 2. Atherosclerosis.   Electronically Signed   By: San Morelle M.D.   On: 08/08/2014 14:02     EKG Interpretation   Date/Time:  Thursday August 08 2014 13:08:29 EST Ventricular Rate:  100 PR Interval:  186 QRS Duration: 124 QT Interval:  351 QTC Calculation: 453 R Axis:   -85 Text Interpretation:  Sinus tachycardia Right bundle branch block Probable  inferior infarct, recent Probable anterolateral infarct, age indeterm No  significant change since 2014  Confirmed by Jaki Hammerschmidt  MD, Timber Marshman ([id number]) on  08/08/2014 1:11:04 PM      MDM   Final diagnoses:  Chest pain, unspecified chest pain type    Patient is currently a DO NOT RESUSCITATE but depending on the type of treatment family still wants some treatment done. Her symptoms are concerning for angina given description and her prior CAD history. Has a very mild troponin elevation is unclear if this is an early NSTEMI vs other small elevation. Will give full dose ASA. Admit to medicine.    Ephraim Hamburger, MD 08/08/14 [phone]

## 2014-08-08 NOTE — H&P (Signed)
[facility] History and Physical  [name] [id number] DOB: 12-01-1926 DOA: 08/08/2014  Referring physician: ER  PCP: Jani Gravel, MD   Chief Complaint: Chest pain  HPI: LAURENDA [name] is a 81 y.o. female  This is an 81 year old lady, diabetic, hypertensive, history of cerebrovascular disease/stroke and history of CABG, now presents with chest pain around noon today when she was undergoing physical therapy. It seems that she has memory problems and her recollection of this is not entirely clear. She thinks that she may have had some sweating with the pain but denies any nausea or dyspnea. The pain seemed to be in the central chest and radiate to the back. According to the granddaughter who is present with her at the bedside and who is a Marine scientist, she thinks the pain did not last but maybe 15 minutes. She was given sublingual nitroglycerin at the nursing home and she seemed to improve. Evaluation in the emergency room shows a slightly elevated troponin level and she is now being admitted for further investigation.   Review of Systems:  Unable to give me a clear review of systems secondary to memory issues.   Past Medical History  Diagnosis Date  . Pure hypercholesterolemia   . Type II or unspecified type diabetes mellitus without mention of complication, not stated as uncontrolled   . Unspecified essential hypertension   . Unspecified transient cerebral ischemia   . Coronary atherosclerosis of native coronary artery     four-vessel CABG, 7/06, ... normal Cardiolite; EF 79%, 8/10  . PUD (peptic ulcer disease)   . Osteoarthritis   . GERD (gastroesophageal reflux disease)   . Hypothyroidism 06/14/2009  . Arthritis   . GIB (gastrointestinal bleeding)     history of  . Stromal tumor of the stomach     history of. partial gastrectomy 2001   Past Surgical History  Procedure Laterality Date  . Coronary artery bypass graft      . four-vessel CABG, 7/06, ... normal Cardiolite;  EF 79%, 8/10  . Partial thyroid surgery    . Cataract extraction    . Partial gastrectomy  [facility]  . Abdominal hysterectomy    . Gallbladder resection     Social History:  reports that she has never smoked. She has never used smokeless tobacco. She reports that she does not drink alcohol or use illicit drugs.  Allergies  Allergen Reactions  . Codeine Hives  . Contrast Media [Iodinated Diagnostic Agents] Hives  . Sulfa Antibiotics Hives  . Guaifenesin & Derivatives   . Iodine   . Adhesive [Tape] Rash  . Latex Rash    Family History  Problem Relation Age of Onset  . Heart attack Brother 73  . Coronary artery disease    . Hypertension      Prior to Admission medications   Medication Sig Start Date End Date Taking? Authorizing Provider  acetaminophen (TYLENOL) 325 MG tablet Take 325 mg by mouth every 8 (eight) hours as needed. For pain    Historical Provider, MD  Calcium 500 MG tablet Take 600 mg by mouth 2 (two) times daily.    Historical Provider, MD  clopidogrel (PLAVIX) 75 MG tablet Take 75 mg by mouth daily.      Historical Provider, MD  Cranberry 475 MG CAPS Take 475 mg by mouth daily.    Historical Provider, MD  diclofenac sodium (VOLTAREN) 1 % GEL Apply 2 g topically daily. *applied to right shoulder for pain*  Historical Provider, MD  Emollient [facility] EX) Apply 1 application topically 2 (two) times daily.    Historical Provider, MD  haloperidol (HALDOL) 0.5 MG tablet Take 0.5 mg by mouth 2 (two) times daily.     Historical Provider, MD  hydrocortisone butyrate (LOCOID) 0.1 % CREA cream Apply 1 application topically daily. *applied to face for rash around mouth*    Historical Provider, MD  insulin glargine (LANTUS) 100 UNIT/ML injection Inject 20 Units into the skin at bedtime.     Historical Provider, MD  isosorbide mononitrate (IMDUR) 30 MG 24 hr tablet Take 30 mg by mouth daily.    Historical Provider, MD  levothyroxine (SYNTHROID, LEVOTHROID)  112 MCG tablet Take 112 mcg by mouth daily before breakfast.    Historical Provider, MD  lisinopril (PRINIVIL,ZESTRIL) 10 MG tablet Take 10 mg by mouth daily.     Historical Provider, MD  LORazepam (ATIVAN) 0.5 MG tablet Take 0.5 mg by mouth daily as needed for anxiety.    Historical Provider, MD  metFORMIN (GLUCOPHAGE) 500 MG tablet Take 500 mg by mouth daily.    Historical Provider, MD  Multiple Vitamins-Minerals (CENTRUM SILVER PO) Take 1 tablet by mouth daily.     Historical Provider, MD  nitroGLYCERIN (NITROSTAT) 0.4 MG SL tablet Place 1 tablet (0.4 mg total) under the tongue every 5 (five) minutes as needed. 09/15/10   Ezra Sites, MD  Omega-3 Fatty Acids (FISH OIL) 1000 MG CAPS Take 1,000 mg by mouth daily.    Historical Provider, MD  omeprazole (PRILOSEC) 20 MG capsule Take 20 mg by mouth 2 (two) times daily.    Historical Provider, MD  polyethylene glycol powder (GLYCOLAX/MIRALAX) powder Take 17 g by mouth daily.    Historical Provider, MD  Polyvinyl Alcohol-Povidone (FRESHKOTE) 2.7-2 % SOLN Apply 1 drop to eye 3 (three) times daily.    Historical Provider, MD  predniSONE (DELTASONE) 5 MG tablet Take 5 mg by mouth daily.    Historical Provider, MD  sertraline (ZOLOFT) 50 MG tablet Take 75 mg by mouth daily.    Historical Provider, MD  shark liver oil-cocoa butter (PREPARATION H) 0.25-3-85.5 % suppository Place 1 suppository rectally at bedtime.    Historical Provider, MD  simvastatin (ZOCOR) 20 MG tablet Take 20 mg by mouth at bedtime.      Historical Provider, MD  traMADol (ULTRAM) 50 MG tablet Take 0.5 tablets (25 mg total) by mouth every 6 (six) hours as needed. 02/22/14   Orpah Greek, MD  Wheat Dextrin (BENEFIBER) POWD Take 5 mLs by mouth daily.    Historical Provider, MD   Physical Exam: Filed Vitals:   08/08/14 1305 08/08/14 1330 08/08/14 1400 08/08/14 1430  BP: 113/90 102/59 111/70 108/62  Pulse: 101 98 95 93  Temp: 97.7 F (36.5 C)     TempSrc: Oral     Resp: 16 22  19 12   Height: 5\' 4"  (1.626 m)     Weight: 61.236 kg (135 lb)     SpO2: 93% 92% 97% 96%    Wt Readings from Last 3 Encounters:  08/08/14 61.236 kg (135 lb)  02/22/14 57.607 kg (127 lb)  05/15/11 57.607 kg (127 lb)    General:  Appears calm and comfortable. She does not appear to be in pain at the present time. Eyes: PERRL, normal lids, irises & conjunctiva ENT: grossly normal hearing, lips & tongue Neck: no LAD, masses or thyromegaly Cardiovascular: RRR, no m/r/g. No LE edema. No gallop rhythm. Telemetry:  SR, no arrhythmias  Respiratory: CTA bilaterally, no w/r/r. Normal respiratory effort. Abdomen: soft, ntnd Skin: no rash or induration seen on limited exam Musculoskeletal: grossly normal tone BUE/BLE Psychiatric: grossly normal mood and affect, speech fluent and appropriate Neurologic: grossly non-focal.          Labs on Admission:  Basic Metabolic Panel:  Recent Labs Lab 08/08/14 1315  NA 137  K 4.4  CL 100  CO2 30  GLUCOSE 159*  BUN 15  CREATININE 1.00  CALCIUM 9.3   Liver Function Tests: No results for input(s): AST, ALT, ALKPHOS, BILITOT, PROT, ALBUMIN in the last 168 hours. No results for input(s): LIPASE, AMYLASE in the last 168 hours. No results for input(s): AMMONIA in the last 168 hours. CBC:  Recent Labs Lab 08/08/14 1315  WBC 13.0*  HGB 14.7  HCT 45.2  MCV 88.3  PLT 266   Cardiac Enzymes:  Recent Labs Lab 08/08/14 1315  TROPONINI 0.05*    BNP (last 3 results) No results for input(s): BNP in the last 8760 hours.  ProBNP (last 3 results) No results for input(s): PROBNP in the last 8760 hours.  CBG: No results for input(s): GLUCAP in the last 168 hours.  Radiological Exams on Admission: Dg Chest Port 1 View  08/08/2014   CLINICAL DATA:  Chest pain.  EXAM: PORTABLE CHEST - 1 VIEW  COMPARISON:  One-view chest 03/07/2013  FINDINGS: The heart size is normal. Atherosclerotic calcifications are present within the aorta. The lung volumes  are low. No focal airspace disease is present. There is no edema or effusion to suggest failure. Mild interstitial coarsening is chronic. Degenerative changes are noted at the [facility] joints bilaterally. The visualized soft tissues and bony thorax are otherwise unremarkable.  IMPRESSION: 1. No acute cardiopulmonary disease or significant interval change. 2. Atherosclerosis.   Electronically Signed   By: San Morelle M.D.   On: 08/08/2014 14:02    EKG: Independently reviewed. Normal sinus rhythm without any acute ST-T wave changes. There are some chronic changes.  Assessment/Plan   1. Chest pain. Certainly this patient has multiple risk factors for coronary artery disease and in fact has had a history of coronary artery disease. Initial troponin is slightly elevated. We will cycle serial cardiac enzymes and obtain cardiology consultation. I'm not convinced she is a candidate for cardiac catheterization. Continue with Plavix. 2. Hypertension, stable. 3. Diabetes. Continue with Lantus insulin and sliding scale. 4. Status post CVA with right-sided hemiplegia, stable.  Further recommendations will depend on patient's hospital progress.   Code Status: DO NOT RESUSCITATE. This was confirmed with the granddaughter.   DVT Prophylaxis: Heparin.  Family Communication: I discussed the plan with the granddaughter at the bedside.  Disposition Plan: Back to the nursing home when medically stable.   Time spent: 45 minutes.  Doree Albee [facility] Pager [phone].

## 2014-08-08 NOTE — Progress Notes (Signed)
PHARMACIST - PHYSICIAN ORDER COMMUNICATION  CONCERNING: P&T Medication Policy on Herbal Medications  DESCRIPTION:  This patient's order for:  Cranberry  has been noted.  This product(s) is classified as an "herbal" or natural product. Due to a lack of definitive safety studies or FDA approval, nonstandard manufacturing practices, plus the potential risk of unknown drug-drug interactions while on inpatient medications, the Pharmacy and Therapeutics Committee does not permit the use of "herbal" or natural products of this type within [facility].   ACTION TAKEN: The pharmacy department is unable to verify this order at this time and your patient has been informed of this safety policy. Please reevaluate patient's clinical condition at discharge and address if the herbal or natural product(s) should be resumed at that time.  Netta Cedars, PharmD, BCPS 08/08/2014@4 :57 PM

## 2014-08-09 ENCOUNTER — Encounter (HOSPITAL_COMMUNITY): Payer: Self-pay | Admitting: *Deleted

## 2014-08-09 DIAGNOSIS — M6281 Muscle weakness (generalized): Secondary | ICD-10-CM | POA: Diagnosis not present

## 2014-08-09 DIAGNOSIS — R293 Abnormal posture: Secondary | ICD-10-CM | POA: Diagnosis not present

## 2014-08-09 DIAGNOSIS — R079 Chest pain, unspecified: Secondary | ICD-10-CM | POA: Insufficient documentation

## 2014-08-09 DIAGNOSIS — R279 Unspecified lack of coordination: Secondary | ICD-10-CM | POA: Diagnosis not present

## 2014-08-09 DIAGNOSIS — R633 Feeding difficulties: Secondary | ICD-10-CM | POA: Diagnosis not present

## 2014-08-09 DIAGNOSIS — R269 Unspecified abnormalities of gait and mobility: Secondary | ICD-10-CM | POA: Diagnosis not present

## 2014-08-09 LAB — TROPONIN I: Troponin I: 0.05 ng/mL — ABNORMAL HIGH (ref <0.031)

## 2014-08-09 LAB — GLUCOSE, CAPILLARY
GLUCOSE-CAPILLARY: 136 mg/dL — AB (ref 70–99)
Glucose-Capillary: 100 mg/dL — ABNORMAL HIGH (ref 70–99)

## 2014-08-09 MED ORDER — PRO-STAT SUGAR FREE PO LIQD: 30.0000 mL | Freq: Every day | ORAL | Status: DC

## 2014-08-09 NOTE — Consult Note (Signed)
CARDIOLOGY CONSULT NOTE   Patient ID: NANINE DYSINGER MRN: [id number] DOB/AGE: April 21, 1927 81 y.o.  Admit Date: 08/08/2014 Referring Physician: PTH Primary Physician: Jani Gravel, MD Consulting Cardiologist: Carlyle Dolly MD Primary Cardiologist (Formerly [facility]) Reason for Consultation: Chest Pain  Clinical Summary Ms. Menke is a 81 y.o.female with known history of CAD, CABG (4 V 2006), Hypertensive/Hemorrhagic  CVA (questionable hx of PAF but not confirmed by OP cardiac monitor-no anticoagulation) ,2012 chronic pain syndrome, hypertension with  multiple medical problems presented to ER. Patient is a poor historian, history is obtained from current and PMH. She apparently was undergoing PT when she began to have chest discomfort radiating to her back. She was given SL NTG and ASA 81 mg for pain relief and brought to ER.    On arrival to ER, BP 113/90, HR 101, O2 Sat 92%,Troponin 0.05; 0.06,  WBC elevated at 13.0 , CXR negative for pneumonia or CHF, U/a negative for UTI. EKG with RBBB, anterior lateral T-wave inversions noted. She currently denies chest pain.    Allergies  Allergen Reactions  . Codeine Hives  . Contrast Media [Iodinated Diagnostic Agents] Hives  . Sulfa Antibiotics Hives  . Guaifenesin & Derivatives   . Iodine   . Adhesive [Tape] Rash  . Latex Rash    Medications Scheduled Medications: . calcium carbonate  1 tablet Oral BID  . clopidogrel  75 mg Oral Daily  . diclofenac sodium  2 g Topical Daily  . feeding supplement (PRO-STAT SUGAR FREE 64)  30 mL Oral Daily  . haloperidol  0.5 mg Oral BID  . heparin  5,000 Units Subcutaneous 3 times per day  . hydrocerin   Topical BID  . hydrocortisone cream   Topical Daily  . insulin aspart  0-15 Units Subcutaneous TID WC  . insulin aspart  0-5 Units Subcutaneous QHS  . insulin glargine  20 Units Subcutaneous QHS  . isosorbide mononitrate  30 mg Oral Daily  . levothyroxine  112 mcg Oral QAC breakfast  .  lisinopril  10 mg Oral Daily  . metFORMIN  500 mg Oral Daily  . multivitamin with minerals  1 tablet Oral Daily  . omega-3 acid ethyl esters  1 g Oral Daily  . pantoprazole  40 mg Oral Daily  . phenylephrine  1 suppository Rectal QHS  . polyethylene glycol  17 g Oral Daily  . polyvinyl alcohol  1 drop Both Eyes TID  . predniSONE  5 mg Oral Daily  . sertraline  75 mg Oral Daily  . simvastatin  20 mg Oral QHS       PRN Medications: acetaminophen, LORazepam, nitroGLYCERIN, ondansetron (ZOFRAN) IV, traMADol   Past Medical History  Diagnosis Date  . Pure hypercholesterolemia   . Type II or unspecified type diabetes mellitus without mention of complication, not stated as uncontrolled   . Unspecified essential hypertension   . Unspecified transient cerebral ischemia   . Coronary atherosclerosis of native coronary artery     four-vessel CABG, 7/06, ... normal Cardiolite; EF 79%, 8/10  . PUD (peptic ulcer disease)   . Osteoarthritis   . GERD (gastroesophageal reflux disease)   . Hypothyroidism 06/14/2009  . Arthritis   . GIB (gastrointestinal bleeding)     history of  . Stromal tumor of the stomach     history of. partial gastrectomy 2001    Past Surgical History  Procedure Laterality Date  . Coronary artery bypass graft      . four-vessel CABG,  7/06, ... normal Cardiolite; EF 79%, 8/10  . Partial thyroid surgery    . Cataract extraction    . Partial gastrectomy  [facility]  . Abdominal hysterectomy    . Gallbladder resection      Family History  Problem Relation Age of Onset  . Heart attack Brother 53  . Coronary artery disease    . Hypertension      Social History Ms. Maves reports that she has never smoked. She has never used smokeless tobacco. Ms. Evers reports that she does not drink alcohol.  Review of Systems Complete review of systems are found to be negative unless outlined in H&P above.  Physical Examination Blood pressure 118/66,  pulse 72, temperature 97.5 F (36.4 C), temperature source Oral, resp. rate 16, height 5\' 4"  (1.626 m), weight 61.236 kg (135 lb), SpO2 97 %.  Intake/Output Summary (Last 24 hours) at 08/09/14 0721 Last data filed at 08/08/14 1700  Gross per 24 hour  Intake    240 ml  Output      0 ml  Net    240 ml    Telemetry: NSR with RBBB  [id number] but easily arousable HEENT: Conjunctiva and lids normal, oropharynx clear with moist mucosa. Neck: Supple, no elevated JVP or carotid bruits, no thyromegaly. Lungs: Some upper airway congestion, snoring, no wheezes or rhonchi. Cardiac: Regular rate and rhythm, no S3 or significant systolic murmur, no pericardial rub. Abdomen: Soft, nontender, no hepatomegaly, bowel sounds present, no guarding or rebound. Extremities: No pitting edema, distal pulses 2+. Skin: Warm and dry. Musculoskeletal: No kyphosis. Neuropsychiatric: Sleepy, responsive, poor historian   Prior Cardiac Testing/Procedures  1. CABG: 01/2005 SURGICAL PROCEDURE: Coronary artery bypass grafting x 4, with left internal mammary to the left anterior descending coronary artery, sequential reverse saphenous vein graft to the first and second obtuse marginal, reverse saphenous vein graft to the distal right coronary artery, with right Endovein harvesting.  2. NM stress test 2007 IMPRESSION:  Negative stress Myoview study revealing impaired exercise capacity, normal left ventricular size, normal left ventricular systolic function, and neither scintigraphic or electrocardiographic evidence for myocardial ischemia or infarction  3. Echocardiogram 2009 Overall left ventricular systolic function was normal. Left    ventricular ejection fraction was estimated to be 60 %. Left    ventricular wall thickness was mildly increased. - The aortic valve was mildly calcified. There was trivial aortic    valvular regurgitation. - The left atrium was mildly dilated. - There  was no echocardiographic evidence for a cardiac source of    embolism.  Lab Results  Basic Metabolic Panel:  Recent Labs Lab 08/08/14 1315  NA 137  K 4.4  CL 100  CO2 30  GLUCOSE 159*  BUN 15  CREATININE 1.00  CALCIUM 9.3    CBC:  Recent Labs Lab 08/08/14 1315  WBC 13.0*  HGB 14.7  HCT 45.2  MCV 88.3  PLT 266    Cardiac Enzymes:  Recent Labs Lab 08/08/14 1315 08/08/14 1558 08/08/14 1842 08/08/14 2153 08/09/14 0028  TROPONINI 0.05* 0.06* 0.06* 0.05* 0.05*    Radiology: Dg Chest Port 1 View  08/08/2014   CLINICAL DATA:  Chest pain.  EXAM: PORTABLE CHEST - 1 VIEW  COMPARISON:  One-view chest 03/07/2013  FINDINGS: The heart size is normal. Atherosclerotic calcifications are present within the aorta. The lung volumes are low. No focal airspace disease is present. There is no edema or effusion to suggest failure. Mild interstitial coarsening  is chronic. Degenerative changes are noted at the [facility] joints bilaterally. The visualized soft tissues and bony thorax are otherwise unremarkable.  IMPRESSION: 1. No acute cardiopulmonary disease or significant interval change. 2. Atherosclerosis.   Electronically Signed   By: San Morelle M.D.   On: 08/08/2014 14:02     ECG: SR, RBBB with T-wave inversion anterio/lateral.    Impression and Recommendations  1.Chest Pain:  Occuring with exertion while undergoing PT. Radiation to her back. Mildly elevated troponin 0.05, 0.06;0.06, 0.05; 0.05 respectively. She is currently pain free and found pain relief with NTG prior to coming to ER. She has known history of CABG.   Due to multiple co-morbidities and chronic health problems would not plan further cardiac testing, Continue conservative medical management.  She is on nitrates, 30 mg daily. BP is soft. Would not increase dose at this time. Can consider Ranexa. She is not on BB or CCB for anginal control. May benefit from low dose lopressor 12.5 mg BID or amlodipine 2.5 mg  daily. Will discuss further with Dr. Harl Bowie before starting.   2. CAD: Hx of CABG in 2006. Follow up NM study a year later due to recurrent chest pain was negative for ischemia. She has not been seen by cardiology since 2012. Continue medical management with ASA, statin. Consider low dose BB for anginal treatment.   3.CVA: Hx of hemorrhagic infarct of the inferior right cerebellum, lacunar infarcts, on Plavix/ Not a anticoagulation candidate.   4. Hypertension: Currently well controlled.   5. DNR  Signed: Phill Myron. [place] NP [place]  08/09/2014, 7:21 AM Co-Sign MD  Patient seen and discussed with NP Purcell Nails, I agree with her documentation above. 81 yo female DM, hx of stomach CA, HL, HTN, CVA, and CAD with CABG in 2006, DNR status admitted with chest pain. From notes she has been on plavix instead of ASA for secondary prevention, however the reason is not clearly described, perhaps due to her hx of stomach CA. From notes has had long history of recurrent chest pain since CABG in 2006.   Patient admitted with chest pain. Patient is a poor historian since her stroke her son reports. From his talks with her he gathers that it was more of left shoulder pain as opposed to chest pain, however since she is limited from a communication standpoint she has not been able to get this across.   CXR no acute process K 4.4 Cr 1, GFR 58 WBC 13, HGB 14.7 Plt 266 Trop 0.05 and stable EKG SR, RBBB,LAFB. ST/T changes associated with RBBB in anterior leads.   Atypical chest pain, evaluation by history difficult due to the patient's chronic poor mental status. From family they gather it was more shoulder pain that she experienced. She is tender to palpation in this area. No acute EKG ischemic changes, trop 0.05 just above level of detection and steady, this pattern is not overally consistent with ischemia. Due to patient's advanced comorbidities and chronic poor functional status and DNR status would not favor  aggressive diagnostic testing for her, family is in agreement. Will obtain echo to further risk stratify, but likely plan will be continued medical threrapy. Would avoid beta blockers given her bifasciluar block. Soft bp's, would not intensify antianginal therapy at this time.    Zandra Abts MD

## 2014-08-09 NOTE — Clinical Social Work Psychosocial (Signed)
Clinical Social Work Department BRIEF PSYCHOSOCIAL ASSESSMENT 08/09/2014  Patient:  [name], [name]     Account Number:  [id number]     Admit date:  08/08/2014  Clinical Social Worker:  Wyatt Haste  Date/Time:  08/09/2014 11:31 AM  Referred by:  CSW  Date Referred:  08/09/2014 Referred for  SNF Placement   Other Referral:   Interview type:  Patient Other interview type:   son- [name]    PSYCHOSOCIAL DATA Living Status:  FACILITY Admitted from facility:  [facility] Level of care:  [place] Primary support name:  [name] Primary support relationship to patient:  CHILD, ADULT Degree of support available:   supportive    CURRENT CONCERNS Current Concerns  Post-Acute Placement   Other Concerns:    SOCIAL WORK ASSESSMENT / PLAN CSW met with pt and pt's son, [name] at bedside. Pt oriented to self only. [name] states that pt's son, [name] is [place] for pt. [name] reports pt has been a resident at [facility] for over 3 years. She had a stroke in 2012 and went to [facility] shortly after that. Pt has 4 sons who are very involved in her care and visit often. [name] said that he still works full time so is more limited with visits, but his other brothers are more available. Family request return to [facility]. Per [name] at facility, pt is nursing level of care and okay to return.   Assessment/plan status:  Psychosocial Support/Ongoing Assessment of Needs Other assessment/ plan:   Information/referral to community resources:   [facility]    PATIENT'S/FAMILY'S RESPONSE TO PLAN OF CARE: Pt pleasantly confused. Family report positive feelings regarding return to [facility] when medically stable.       [name], [place]

## 2014-08-09 NOTE — Discharge Summary (Signed)
Physician Discharge Summary  [name] [id number] DOB: 1926/06/22 DOA: 08/08/2014  PCP: Jani Gravel, MD  Admit date: 08/08/2014 Discharge date: 08/09/2014  Time spent: 40 minutes  Recommendations for Outpatient Follow-up:  1. Dr Maudie Mercury 2 weeks    Discharge Diagnoses:  Active Problems:   Diabetes   Essential hypertension   CAD, ARTERY BYPASS GRAFT   Chest pain   Status post CVA   Discharge Condition: stable  Diet recommendation: carbmodified  Filed Weights   08/08/14 1305  Weight: 61.236 kg (135 lb)    History of present illness:  [name] is a 81 y.o. female  This is an 81 year old lady, diabetic, hypertensive, history of cerebrovascular disease/stroke and history of CABG, presented on 08/08/14 with chest pain around noon  when she was undergoing physical therapy. She has memory problems and her recollection of this was not entirely clear. She thought that she may have had some sweating with the pain but denied any nausea or dyspnea. The pain seemed to be in the central chest and radiate to the back. According to the granddaughter who was present with her at the bedside and who is a nurse, the pain did not last but maybe 15 minutes. She was given sublingual nitroglycerin at the nursing home and she seemed to improve. Evaluation in the emergency room showed a slightly elevated troponin level and she is now being admitted for further investigation.  Hospital Course:  1. Chest pain. Certainly this patient has multiple risk factors for coronary artery disease and in fact has had a history of coronary artery disease. Troponin mildly elevated x3. No acute EKG ischemic changes per cards. Evaluated by cardiology who opine troponin pattern not overly consistent with ischemia and given advanced age and comorbidities recommended medical therapy. Recommended avoiding BB given block and given soft BP not intensifying antianginal therapy.  2. Hypertension, stable. 3. Diabetes.  Continue with Lantus insulin and sliding scale. 4. Status post CVA with right-sided hemiplegia, stable.   Procedures: Systolic function was normal. The estimated ejection fraction was in the range of 60% to 65%. Wall motion was normal; there were no regional wall motion abnormalities. Doppler parameters are consistent with abnormal left ventricular relaxation (grade 1 diastolic dysfunction).  Consultations:  cardiology  Discharge Exam: Filed Vitals:   08/09/14 0542  BP: 118/66  Pulse: 72  Temp: 97.5 F (36.4 C)  Resp: 16    General: somewhat frail appears comfortable Cardiovascular: RRR no m/g/r no LE edema Respiratory: normal effort BS clear  Discharge Instructions   Discharge Instructions    Diet - low sodium heart healthy    Complete by:  As directed      Discharge instructions    Complete by:  As directed      Increase activity slowly    Complete by:  As directed           Current Discharge Medication List    START taking these medications   Details  Amino Acids-Protein Hydrolys (FEEDING SUPPLEMENT, PRO-STAT SUGAR FREE 64,) LIQD Take 30 mLs by mouth daily. Qty: 900 mL, Refills: 0      CONTINUE these medications which have NOT CHANGED   Details  Calcium 500 MG tablet Take 500 mg by mouth 2 (two) times daily.     clopidogrel (PLAVIX) 75 MG tablet Take 75 mg by mouth daily.      Cranberry 475 MG CAPS Take 475 mg by mouth daily.    haloperidol (HALDOL) 0.5 MG tablet Take  0.5 mg by mouth 2 (two) times daily.     insulin glargine (LANTUS) 100 UNIT/ML injection Inject 20 Units into the skin at bedtime.     isosorbide mononitrate (IMDUR) 30 MG 24 hr tablet Take 30 mg by mouth daily.    levothyroxine (SYNTHROID, LEVOTHROID) 112 MCG tablet Take 112 mcg by mouth daily before breakfast.    lisinopril (PRINIVIL,ZESTRIL) 10 MG tablet Take 10 mg by mouth daily.     LORazepam (ATIVAN) 0.5 MG tablet Take 0.5 mg by mouth daily as needed for anxiety.     metFORMIN (GLUCOPHAGE) 500 MG tablet Take 500 mg by mouth daily.    Multiple Vitamins-Minerals (CENTRUM SILVER PO) Take 1 tablet by mouth daily.     Omega-3 Fatty Acids (FISH OIL) 1000 MG CAPS Take 1,000 mg by mouth daily.    ondansetron (ZOFRAN) 4 MG tablet Take 4 mg by mouth every 6 (six) hours as needed for nausea or vomiting.    pantoprazole (PROTONIX) 40 MG tablet Take 40 mg by mouth 2 (two) times daily.    polyethylene glycol powder (GLYCOLAX/MIRALAX) powder Take 17 g by mouth daily.    Polyvinyl Alcohol-Povidone (FRESHKOTE) 2.7-2 % SOLN Apply 1 drop to eye 3 (three) times daily.    predniSONE (DELTASONE) 20 MG tablet Take 20 mg by mouth every evening.    sertraline (ZOLOFT) 50 MG tablet Take 75 mg by mouth daily.    shark liver oil-cocoa butter (PREPARATION H) 0.25-3-85.5 % suppository Place 1 suppository rectally at bedtime.    simvastatin (ZOCOR) 20 MG tablet Take 20 mg by mouth at bedtime.      traMADol (ULTRAM) 50 MG tablet Take 0.5 tablets (25 mg total) by mouth every 6 (six) hours as needed. Qty: 15 tablet, Refills: 0    Wheat Dextrin (BENEFIBER) POWD Take 5 mLs by mouth daily.    nitroGLYCERIN (NITROSTAT) 0.4 MG SL tablet Place 1 tablet (0.4 mg total) under the tongue every 5 (five) minutes as needed. Qty: 25 tablet, Refills: 3      STOP taking these medications     acetaminophen (TYLENOL) 325 MG tablet      diclofenac sodium (VOLTAREN) 1 % GEL      omeprazole (PRILOSEC) 20 MG capsule        Allergies  Allergen Reactions  . Codeine Hives  . Contrast Media [Iodinated Diagnostic Agents] Hives  . Sulfa Antibiotics Hives  . Guaifenesin & Derivatives   . Iodine   . Adhesive [Tape] Rash  . Latex Rash      The results of significant diagnostics from this hospitalization (including imaging, microbiology, ancillary and laboratory) are listed below for reference.    Significant Diagnostic Studies: Dg Chest Port 1 View  08/08/2014   CLINICAL DATA:  Chest  pain.  EXAM: PORTABLE CHEST - 1 VIEW  COMPARISON:  One-view chest 03/07/2013  FINDINGS: The heart size is normal. Atherosclerotic calcifications are present within the aorta. The lung volumes are low. No focal airspace disease is present. There is no edema or effusion to suggest failure. Mild interstitial coarsening is chronic. Degenerative changes are noted at the [facility] joints bilaterally. The visualized soft tissues and bony thorax are otherwise unremarkable.  IMPRESSION: 1. No acute cardiopulmonary disease or significant interval change. 2. Atherosclerosis.   Electronically Signed   By: San Morelle M.D.   On: 08/08/2014 14:02    Microbiology: No results found for this or any previous visit (from the past 240 hour(s)).   Labs: Basic Metabolic  Panel:  Recent Labs Lab 08/08/14 1315  NA 137  K 4.4  CL 100  CO2 30  GLUCOSE 159*  BUN 15  CREATININE 1.00  CALCIUM 9.3   Liver Function Tests: No results for input(s): AST, ALT, ALKPHOS, BILITOT, PROT, ALBUMIN in the last 168 hours. No results for input(s): LIPASE, AMYLASE in the last 168 hours. No results for input(s): AMMONIA in the last 168 hours. CBC:  Recent Labs Lab 08/08/14 1315  WBC 13.0*  HGB 14.7  HCT 45.2  MCV 88.3  PLT 266   Cardiac Enzymes:  Recent Labs Lab 08/08/14 1315 08/08/14 1558 08/08/14 1842 08/08/14 2153 08/09/14 0028  TROPONINI 0.05* 0.06* 0.06* 0.05* 0.05*   BNP: BNP (last 3 results) No results for input(s): BNP in the last 8760 hours.  ProBNP (last 3 results) No results for input(s): PROBNP in the last 8760 hours.  CBG:  Recent Labs Lab 08/08/14 1703 08/08/14 2055 08/09/14 0732 08/09/14 1203  GLUCAP 126* 194* 100* 136*       Signed:  Orange Hilligoss M  [facility] 08/09/2014, 1:59 PM

## 2014-08-09 NOTE — Progress Notes (Signed)
Echo looks good. From our standpoint ok for discharge today. No further cardiac testing planned.   Zandra Abts MD

## 2014-08-09 NOTE — Progress Notes (Signed)
UR completed 

## 2014-08-09 NOTE — Clinical Social Work Note (Addendum)
Pt d/c today back to [facility]. Pt's sons and facility aware and agreeable. D/C summary faxed. Family to provide transport. Facility agreeable to no FL2 due to <24 hour observation.   Benay Pike, [place]

## 2014-08-09 NOTE — Care Management Note (Signed)
    Page 1 of 1   08/09/2014     8:20:42 AM CARE MANAGEMENT NOTE 08/09/2014  Patient:  [name], [name]   Account Number:  [id number]  Date Initiated:  08/09/2014  Documentation initiated by:  Theophilus Kinds  Subjective/Objective Assessment:   Pt admitted from [facility] with CP. Anticipate pt will discharge back to facility.     Action/Plan:   CSW is aware and will arrange discharge to facility when medically stable.   Anticipated DC Date:  08/10/2014   Anticipated DC Plan:  SKILLED NURSING FACILITY  In-house referral  Clinical Social Worker      DC Planning Services  CM consult      Choice offered to / List presented to:             Status of service:  Completed, signed off Medicare Important Message given?   (If response is "NO", the following Medicare IM given date fields will be blank) Date Medicare IM given:   Medicare IM given by:   Date Additional Medicare IM given:   Additional Medicare IM given by:    Discharge Disposition:  [place]  Per UR Regulation:    If discussed at Long Length of Stay Meetings, dates discussed:    Comments:  08/09/14 0815 Christinia Gully, RN BSN CM

## 2014-08-09 NOTE — Progress Notes (Signed)
*  PRELIMINARY RESULTS* Echocardiogram 2D Echocardiogram has been performed.  Leavy Cella 08/09/2014, 12:29 PM

## 2014-08-10 DIAGNOSIS — R279 Unspecified lack of coordination: Secondary | ICD-10-CM | POA: Diagnosis not present

## 2014-08-10 DIAGNOSIS — R293 Abnormal posture: Secondary | ICD-10-CM | POA: Diagnosis not present

## 2014-08-10 DIAGNOSIS — R633 Feeding difficulties: Secondary | ICD-10-CM | POA: Diagnosis not present

## 2014-08-10 DIAGNOSIS — R079 Chest pain, unspecified: Secondary | ICD-10-CM | POA: Diagnosis not present

## 2014-08-10 DIAGNOSIS — M6281 Muscle weakness (generalized): Secondary | ICD-10-CM | POA: Diagnosis not present

## 2014-08-10 DIAGNOSIS — R269 Unspecified abnormalities of gait and mobility: Secondary | ICD-10-CM | POA: Diagnosis not present

## 2014-08-12 DIAGNOSIS — R030 Elevated blood-pressure reading, without diagnosis of hypertension: Secondary | ICD-10-CM | POA: Diagnosis not present

## 2014-08-12 DIAGNOSIS — R079 Chest pain, unspecified: Secondary | ICD-10-CM | POA: Diagnosis not present

## 2014-08-12 DIAGNOSIS — I2510 Atherosclerotic heart disease of native coronary artery without angina pectoris: Secondary | ICD-10-CM | POA: Diagnosis not present

## 2014-08-13 DIAGNOSIS — R269 Unspecified abnormalities of gait and mobility: Secondary | ICD-10-CM | POA: Diagnosis not present

## 2014-08-13 DIAGNOSIS — R2681 Unsteadiness on feet: Secondary | ICD-10-CM | POA: Diagnosis not present

## 2014-08-13 DIAGNOSIS — M6281 Muscle weakness (generalized): Secondary | ICD-10-CM | POA: Diagnosis not present

## 2014-08-13 DIAGNOSIS — R633 Feeding difficulties: Secondary | ICD-10-CM | POA: Diagnosis not present

## 2014-08-13 DIAGNOSIS — R262 Difficulty in walking, not elsewhere classified: Secondary | ICD-10-CM | POA: Diagnosis not present

## 2014-08-13 DIAGNOSIS — R279 Unspecified lack of coordination: Secondary | ICD-10-CM | POA: Diagnosis not present

## 2014-08-13 DIAGNOSIS — R079 Chest pain, unspecified: Secondary | ICD-10-CM | POA: Diagnosis not present

## 2014-08-13 DIAGNOSIS — R293 Abnormal posture: Secondary | ICD-10-CM | POA: Diagnosis not present

## 2014-08-14 DIAGNOSIS — R279 Unspecified lack of coordination: Secondary | ICD-10-CM | POA: Diagnosis not present

## 2014-08-14 DIAGNOSIS — M6281 Muscle weakness (generalized): Secondary | ICD-10-CM | POA: Diagnosis not present

## 2014-08-14 DIAGNOSIS — R269 Unspecified abnormalities of gait and mobility: Secondary | ICD-10-CM | POA: Diagnosis not present

## 2014-08-14 DIAGNOSIS — R079 Chest pain, unspecified: Secondary | ICD-10-CM | POA: Diagnosis not present

## 2014-08-14 DIAGNOSIS — R2681 Unsteadiness on feet: Secondary | ICD-10-CM | POA: Diagnosis not present

## 2014-08-14 DIAGNOSIS — R262 Difficulty in walking, not elsewhere classified: Secondary | ICD-10-CM | POA: Diagnosis not present

## 2014-08-15 DIAGNOSIS — R262 Difficulty in walking, not elsewhere classified: Secondary | ICD-10-CM | POA: Diagnosis not present

## 2014-08-15 DIAGNOSIS — R279 Unspecified lack of coordination: Secondary | ICD-10-CM | POA: Diagnosis not present

## 2014-08-15 DIAGNOSIS — R079 Chest pain, unspecified: Secondary | ICD-10-CM | POA: Diagnosis not present

## 2014-08-15 DIAGNOSIS — R269 Unspecified abnormalities of gait and mobility: Secondary | ICD-10-CM | POA: Diagnosis not present

## 2014-08-15 DIAGNOSIS — M6281 Muscle weakness (generalized): Secondary | ICD-10-CM | POA: Diagnosis not present

## 2014-08-15 DIAGNOSIS — R2681 Unsteadiness on feet: Secondary | ICD-10-CM | POA: Diagnosis not present

## 2014-08-16 DIAGNOSIS — R079 Chest pain, unspecified: Secondary | ICD-10-CM | POA: Diagnosis not present

## 2014-08-16 DIAGNOSIS — R279 Unspecified lack of coordination: Secondary | ICD-10-CM | POA: Diagnosis not present

## 2014-08-16 DIAGNOSIS — R262 Difficulty in walking, not elsewhere classified: Secondary | ICD-10-CM | POA: Diagnosis not present

## 2014-08-16 DIAGNOSIS — M6281 Muscle weakness (generalized): Secondary | ICD-10-CM | POA: Diagnosis not present

## 2014-08-16 DIAGNOSIS — R2681 Unsteadiness on feet: Secondary | ICD-10-CM | POA: Diagnosis not present

## 2014-08-16 DIAGNOSIS — R269 Unspecified abnormalities of gait and mobility: Secondary | ICD-10-CM | POA: Diagnosis not present

## 2014-08-18 DIAGNOSIS — R2681 Unsteadiness on feet: Secondary | ICD-10-CM | POA: Diagnosis not present

## 2014-08-18 DIAGNOSIS — R262 Difficulty in walking, not elsewhere classified: Secondary | ICD-10-CM | POA: Diagnosis not present

## 2014-08-18 DIAGNOSIS — R269 Unspecified abnormalities of gait and mobility: Secondary | ICD-10-CM | POA: Diagnosis not present

## 2014-08-18 DIAGNOSIS — M6281 Muscle weakness (generalized): Secondary | ICD-10-CM | POA: Diagnosis not present

## 2014-08-18 DIAGNOSIS — R079 Chest pain, unspecified: Secondary | ICD-10-CM | POA: Diagnosis not present

## 2014-08-18 DIAGNOSIS — R279 Unspecified lack of coordination: Secondary | ICD-10-CM | POA: Diagnosis not present

## 2014-08-19 DIAGNOSIS — M6281 Muscle weakness (generalized): Secondary | ICD-10-CM | POA: Diagnosis not present

## 2014-08-19 DIAGNOSIS — R279 Unspecified lack of coordination: Secondary | ICD-10-CM | POA: Diagnosis not present

## 2014-08-19 DIAGNOSIS — R269 Unspecified abnormalities of gait and mobility: Secondary | ICD-10-CM | POA: Diagnosis not present

## 2014-08-19 DIAGNOSIS — R262 Difficulty in walking, not elsewhere classified: Secondary | ICD-10-CM | POA: Diagnosis not present

## 2014-08-19 DIAGNOSIS — R079 Chest pain, unspecified: Secondary | ICD-10-CM | POA: Diagnosis not present

## 2014-08-19 DIAGNOSIS — R2681 Unsteadiness on feet: Secondary | ICD-10-CM | POA: Diagnosis not present

## 2014-08-20 DIAGNOSIS — R079 Chest pain, unspecified: Secondary | ICD-10-CM | POA: Diagnosis not present

## 2014-08-20 DIAGNOSIS — R2681 Unsteadiness on feet: Secondary | ICD-10-CM | POA: Diagnosis not present

## 2014-08-20 DIAGNOSIS — M6281 Muscle weakness (generalized): Secondary | ICD-10-CM | POA: Diagnosis not present

## 2014-08-20 DIAGNOSIS — R262 Difficulty in walking, not elsewhere classified: Secondary | ICD-10-CM | POA: Diagnosis not present

## 2014-08-20 DIAGNOSIS — R279 Unspecified lack of coordination: Secondary | ICD-10-CM | POA: Diagnosis not present

## 2014-08-20 DIAGNOSIS — R269 Unspecified abnormalities of gait and mobility: Secondary | ICD-10-CM | POA: Diagnosis not present

## 2014-08-21 DIAGNOSIS — R2681 Unsteadiness on feet: Secondary | ICD-10-CM | POA: Diagnosis not present

## 2014-08-21 DIAGNOSIS — M6281 Muscle weakness (generalized): Secondary | ICD-10-CM | POA: Diagnosis not present

## 2014-08-21 DIAGNOSIS — R079 Chest pain, unspecified: Secondary | ICD-10-CM | POA: Diagnosis not present

## 2014-08-21 DIAGNOSIS — R262 Difficulty in walking, not elsewhere classified: Secondary | ICD-10-CM | POA: Diagnosis not present

## 2014-08-21 DIAGNOSIS — R269 Unspecified abnormalities of gait and mobility: Secondary | ICD-10-CM | POA: Diagnosis not present

## 2014-08-21 DIAGNOSIS — R279 Unspecified lack of coordination: Secondary | ICD-10-CM | POA: Diagnosis not present

## 2014-08-22 DIAGNOSIS — R279 Unspecified lack of coordination: Secondary | ICD-10-CM | POA: Diagnosis not present

## 2014-08-22 DIAGNOSIS — R2681 Unsteadiness on feet: Secondary | ICD-10-CM | POA: Diagnosis not present

## 2014-08-22 DIAGNOSIS — R269 Unspecified abnormalities of gait and mobility: Secondary | ICD-10-CM | POA: Diagnosis not present

## 2014-08-22 DIAGNOSIS — M6281 Muscle weakness (generalized): Secondary | ICD-10-CM | POA: Diagnosis not present

## 2014-08-22 DIAGNOSIS — R079 Chest pain, unspecified: Secondary | ICD-10-CM | POA: Diagnosis not present

## 2014-08-22 DIAGNOSIS — R262 Difficulty in walking, not elsewhere classified: Secondary | ICD-10-CM | POA: Diagnosis not present

## 2014-08-23 DIAGNOSIS — R279 Unspecified lack of coordination: Secondary | ICD-10-CM | POA: Diagnosis not present

## 2014-08-23 DIAGNOSIS — R079 Chest pain, unspecified: Secondary | ICD-10-CM | POA: Diagnosis not present

## 2014-08-23 DIAGNOSIS — R2681 Unsteadiness on feet: Secondary | ICD-10-CM | POA: Diagnosis not present

## 2014-08-23 DIAGNOSIS — R269 Unspecified abnormalities of gait and mobility: Secondary | ICD-10-CM | POA: Diagnosis not present

## 2014-08-23 DIAGNOSIS — M6281 Muscle weakness (generalized): Secondary | ICD-10-CM | POA: Diagnosis not present

## 2014-08-23 DIAGNOSIS — R262 Difficulty in walking, not elsewhere classified: Secondary | ICD-10-CM | POA: Diagnosis not present

## 2014-08-26 DIAGNOSIS — R262 Difficulty in walking, not elsewhere classified: Secondary | ICD-10-CM | POA: Diagnosis not present

## 2014-08-26 DIAGNOSIS — R279 Unspecified lack of coordination: Secondary | ICD-10-CM | POA: Diagnosis not present

## 2014-08-26 DIAGNOSIS — R079 Chest pain, unspecified: Secondary | ICD-10-CM | POA: Diagnosis not present

## 2014-08-26 DIAGNOSIS — R269 Unspecified abnormalities of gait and mobility: Secondary | ICD-10-CM | POA: Diagnosis not present

## 2014-08-26 DIAGNOSIS — M6281 Muscle weakness (generalized): Secondary | ICD-10-CM | POA: Diagnosis not present

## 2014-08-26 DIAGNOSIS — R2681 Unsteadiness on feet: Secondary | ICD-10-CM | POA: Diagnosis not present

## 2014-08-27 DIAGNOSIS — M6281 Muscle weakness (generalized): Secondary | ICD-10-CM | POA: Diagnosis not present

## 2014-08-27 DIAGNOSIS — R262 Difficulty in walking, not elsewhere classified: Secondary | ICD-10-CM | POA: Diagnosis not present

## 2014-08-27 DIAGNOSIS — R279 Unspecified lack of coordination: Secondary | ICD-10-CM | POA: Diagnosis not present

## 2014-08-27 DIAGNOSIS — R079 Chest pain, unspecified: Secondary | ICD-10-CM | POA: Diagnosis not present

## 2014-08-27 DIAGNOSIS — R269 Unspecified abnormalities of gait and mobility: Secondary | ICD-10-CM | POA: Diagnosis not present

## 2014-08-27 DIAGNOSIS — R2681 Unsteadiness on feet: Secondary | ICD-10-CM | POA: Diagnosis not present

## 2014-08-28 DIAGNOSIS — R262 Difficulty in walking, not elsewhere classified: Secondary | ICD-10-CM | POA: Diagnosis not present

## 2014-08-28 DIAGNOSIS — R279 Unspecified lack of coordination: Secondary | ICD-10-CM | POA: Diagnosis not present

## 2014-08-28 DIAGNOSIS — R079 Chest pain, unspecified: Secondary | ICD-10-CM | POA: Diagnosis not present

## 2014-08-28 DIAGNOSIS — R2681 Unsteadiness on feet: Secondary | ICD-10-CM | POA: Diagnosis not present

## 2014-08-28 DIAGNOSIS — M6281 Muscle weakness (generalized): Secondary | ICD-10-CM | POA: Diagnosis not present

## 2014-08-28 DIAGNOSIS — R269 Unspecified abnormalities of gait and mobility: Secondary | ICD-10-CM | POA: Diagnosis not present

## 2014-08-29 DIAGNOSIS — R079 Chest pain, unspecified: Secondary | ICD-10-CM | POA: Diagnosis not present

## 2014-08-29 DIAGNOSIS — R279 Unspecified lack of coordination: Secondary | ICD-10-CM | POA: Diagnosis not present

## 2014-08-29 DIAGNOSIS — R269 Unspecified abnormalities of gait and mobility: Secondary | ICD-10-CM | POA: Diagnosis not present

## 2014-08-29 DIAGNOSIS — E1051 Type 1 diabetes mellitus with diabetic peripheral angiopathy without gangrene: Secondary | ICD-10-CM | POA: Diagnosis not present

## 2014-08-29 DIAGNOSIS — M6281 Muscle weakness (generalized): Secondary | ICD-10-CM | POA: Diagnosis not present

## 2014-08-29 DIAGNOSIS — R262 Difficulty in walking, not elsewhere classified: Secondary | ICD-10-CM | POA: Diagnosis not present

## 2014-08-29 DIAGNOSIS — L84 Corns and callosities: Secondary | ICD-10-CM | POA: Diagnosis not present

## 2014-08-29 DIAGNOSIS — R2681 Unsteadiness on feet: Secondary | ICD-10-CM | POA: Diagnosis not present

## 2014-08-30 DIAGNOSIS — R079 Chest pain, unspecified: Secondary | ICD-10-CM | POA: Diagnosis not present

## 2014-08-30 DIAGNOSIS — R2681 Unsteadiness on feet: Secondary | ICD-10-CM | POA: Diagnosis not present

## 2014-08-30 DIAGNOSIS — M6281 Muscle weakness (generalized): Secondary | ICD-10-CM | POA: Diagnosis not present

## 2014-08-30 DIAGNOSIS — R279 Unspecified lack of coordination: Secondary | ICD-10-CM | POA: Diagnosis not present

## 2014-08-30 DIAGNOSIS — R262 Difficulty in walking, not elsewhere classified: Secondary | ICD-10-CM | POA: Diagnosis not present

## 2014-08-30 DIAGNOSIS — R269 Unspecified abnormalities of gait and mobility: Secondary | ICD-10-CM | POA: Diagnosis not present

## 2014-08-31 DIAGNOSIS — R2681 Unsteadiness on feet: Secondary | ICD-10-CM | POA: Diagnosis not present

## 2014-08-31 DIAGNOSIS — M6281 Muscle weakness (generalized): Secondary | ICD-10-CM | POA: Diagnosis not present

## 2014-08-31 DIAGNOSIS — R262 Difficulty in walking, not elsewhere classified: Secondary | ICD-10-CM | POA: Diagnosis not present

## 2014-08-31 DIAGNOSIS — R269 Unspecified abnormalities of gait and mobility: Secondary | ICD-10-CM | POA: Diagnosis not present

## 2014-08-31 DIAGNOSIS — R079 Chest pain, unspecified: Secondary | ICD-10-CM | POA: Diagnosis not present

## 2014-08-31 DIAGNOSIS — R279 Unspecified lack of coordination: Secondary | ICD-10-CM | POA: Diagnosis not present

## 2014-09-01 DIAGNOSIS — F29 Unspecified psychosis not due to a substance or known physiological condition: Secondary | ICD-10-CM | POA: Diagnosis not present

## 2014-09-01 DIAGNOSIS — F328 Other depressive episodes: Secondary | ICD-10-CM | POA: Diagnosis not present

## 2014-09-01 DIAGNOSIS — F0391 Unspecified dementia with behavioral disturbance: Secondary | ICD-10-CM | POA: Diagnosis not present

## 2014-09-02 DIAGNOSIS — R2681 Unsteadiness on feet: Secondary | ICD-10-CM | POA: Diagnosis not present

## 2014-09-02 DIAGNOSIS — R262 Difficulty in walking, not elsewhere classified: Secondary | ICD-10-CM | POA: Diagnosis not present

## 2014-09-02 DIAGNOSIS — R079 Chest pain, unspecified: Secondary | ICD-10-CM | POA: Diagnosis not present

## 2014-09-02 DIAGNOSIS — M6281 Muscle weakness (generalized): Secondary | ICD-10-CM | POA: Diagnosis not present

## 2014-09-02 DIAGNOSIS — R279 Unspecified lack of coordination: Secondary | ICD-10-CM | POA: Diagnosis not present

## 2014-09-02 DIAGNOSIS — R269 Unspecified abnormalities of gait and mobility: Secondary | ICD-10-CM | POA: Diagnosis not present

## 2014-09-03 DIAGNOSIS — R2681 Unsteadiness on feet: Secondary | ICD-10-CM | POA: Diagnosis not present

## 2014-09-03 DIAGNOSIS — R269 Unspecified abnormalities of gait and mobility: Secondary | ICD-10-CM | POA: Diagnosis not present

## 2014-09-03 DIAGNOSIS — M6281 Muscle weakness (generalized): Secondary | ICD-10-CM | POA: Diagnosis not present

## 2014-09-03 DIAGNOSIS — R079 Chest pain, unspecified: Secondary | ICD-10-CM | POA: Diagnosis not present

## 2014-09-03 DIAGNOSIS — R279 Unspecified lack of coordination: Secondary | ICD-10-CM | POA: Diagnosis not present

## 2014-09-03 DIAGNOSIS — R262 Difficulty in walking, not elsewhere classified: Secondary | ICD-10-CM | POA: Diagnosis not present

## 2014-09-04 DIAGNOSIS — R269 Unspecified abnormalities of gait and mobility: Secondary | ICD-10-CM | POA: Diagnosis not present

## 2014-09-04 DIAGNOSIS — R079 Chest pain, unspecified: Secondary | ICD-10-CM | POA: Diagnosis not present

## 2014-09-04 DIAGNOSIS — M6281 Muscle weakness (generalized): Secondary | ICD-10-CM | POA: Diagnosis not present

## 2014-09-04 DIAGNOSIS — R2681 Unsteadiness on feet: Secondary | ICD-10-CM | POA: Diagnosis not present

## 2014-09-04 DIAGNOSIS — R262 Difficulty in walking, not elsewhere classified: Secondary | ICD-10-CM | POA: Diagnosis not present

## 2014-09-04 DIAGNOSIS — R279 Unspecified lack of coordination: Secondary | ICD-10-CM | POA: Diagnosis not present

## 2014-09-05 DIAGNOSIS — R269 Unspecified abnormalities of gait and mobility: Secondary | ICD-10-CM | POA: Diagnosis not present

## 2014-09-05 DIAGNOSIS — R279 Unspecified lack of coordination: Secondary | ICD-10-CM | POA: Diagnosis not present

## 2014-09-05 DIAGNOSIS — R2681 Unsteadiness on feet: Secondary | ICD-10-CM | POA: Diagnosis not present

## 2014-09-05 DIAGNOSIS — R079 Chest pain, unspecified: Secondary | ICD-10-CM | POA: Diagnosis not present

## 2014-09-05 DIAGNOSIS — M6281 Muscle weakness (generalized): Secondary | ICD-10-CM | POA: Diagnosis not present

## 2014-09-05 DIAGNOSIS — R262 Difficulty in walking, not elsewhere classified: Secondary | ICD-10-CM | POA: Diagnosis not present

## 2014-09-06 DIAGNOSIS — M6281 Muscle weakness (generalized): Secondary | ICD-10-CM | POA: Diagnosis not present

## 2014-09-06 DIAGNOSIS — R269 Unspecified abnormalities of gait and mobility: Secondary | ICD-10-CM | POA: Diagnosis not present

## 2014-09-06 DIAGNOSIS — R262 Difficulty in walking, not elsewhere classified: Secondary | ICD-10-CM | POA: Diagnosis not present

## 2014-09-06 DIAGNOSIS — R079 Chest pain, unspecified: Secondary | ICD-10-CM | POA: Diagnosis not present

## 2014-09-06 DIAGNOSIS — R279 Unspecified lack of coordination: Secondary | ICD-10-CM | POA: Diagnosis not present

## 2014-09-06 DIAGNOSIS — R2681 Unsteadiness on feet: Secondary | ICD-10-CM | POA: Diagnosis not present

## 2014-09-10 DIAGNOSIS — R2681 Unsteadiness on feet: Secondary | ICD-10-CM | POA: Diagnosis not present

## 2014-09-10 DIAGNOSIS — M6281 Muscle weakness (generalized): Secondary | ICD-10-CM | POA: Diagnosis not present

## 2014-09-10 DIAGNOSIS — R079 Chest pain, unspecified: Secondary | ICD-10-CM | POA: Diagnosis not present

## 2014-09-10 DIAGNOSIS — R262 Difficulty in walking, not elsewhere classified: Secondary | ICD-10-CM | POA: Diagnosis not present

## 2014-09-11 DIAGNOSIS — R079 Chest pain, unspecified: Secondary | ICD-10-CM | POA: Diagnosis not present

## 2014-09-11 DIAGNOSIS — R262 Difficulty in walking, not elsewhere classified: Secondary | ICD-10-CM | POA: Diagnosis not present

## 2014-09-11 DIAGNOSIS — M6281 Muscle weakness (generalized): Secondary | ICD-10-CM | POA: Diagnosis not present

## 2014-09-11 DIAGNOSIS — R2681 Unsteadiness on feet: Secondary | ICD-10-CM | POA: Diagnosis not present

## 2014-09-12 DIAGNOSIS — M6281 Muscle weakness (generalized): Secondary | ICD-10-CM | POA: Diagnosis not present

## 2014-09-12 DIAGNOSIS — R079 Chest pain, unspecified: Secondary | ICD-10-CM | POA: Diagnosis not present

## 2014-09-12 DIAGNOSIS — R262 Difficulty in walking, not elsewhere classified: Secondary | ICD-10-CM | POA: Diagnosis not present

## 2014-09-12 DIAGNOSIS — R2681 Unsteadiness on feet: Secondary | ICD-10-CM | POA: Diagnosis not present

## 2014-09-16 DIAGNOSIS — R079 Chest pain, unspecified: Secondary | ICD-10-CM | POA: Diagnosis not present

## 2014-09-16 DIAGNOSIS — R2681 Unsteadiness on feet: Secondary | ICD-10-CM | POA: Diagnosis not present

## 2014-09-16 DIAGNOSIS — M6281 Muscle weakness (generalized): Secondary | ICD-10-CM | POA: Diagnosis not present

## 2014-09-16 DIAGNOSIS — R262 Difficulty in walking, not elsewhere classified: Secondary | ICD-10-CM | POA: Diagnosis not present

## 2014-09-17 DIAGNOSIS — M6281 Muscle weakness (generalized): Secondary | ICD-10-CM | POA: Diagnosis not present

## 2014-09-17 DIAGNOSIS — R262 Difficulty in walking, not elsewhere classified: Secondary | ICD-10-CM | POA: Diagnosis not present

## 2014-09-17 DIAGNOSIS — R2681 Unsteadiness on feet: Secondary | ICD-10-CM | POA: Diagnosis not present

## 2014-09-17 DIAGNOSIS — R079 Chest pain, unspecified: Secondary | ICD-10-CM | POA: Diagnosis not present

## 2014-09-18 DIAGNOSIS — R2681 Unsteadiness on feet: Secondary | ICD-10-CM | POA: Diagnosis not present

## 2014-09-18 DIAGNOSIS — R262 Difficulty in walking, not elsewhere classified: Secondary | ICD-10-CM | POA: Diagnosis not present

## 2014-09-18 DIAGNOSIS — R079 Chest pain, unspecified: Secondary | ICD-10-CM | POA: Diagnosis not present

## 2014-09-18 DIAGNOSIS — M6281 Muscle weakness (generalized): Secondary | ICD-10-CM | POA: Diagnosis not present

## 2014-09-19 DIAGNOSIS — R262 Difficulty in walking, not elsewhere classified: Secondary | ICD-10-CM | POA: Diagnosis not present

## 2014-09-19 DIAGNOSIS — R2681 Unsteadiness on feet: Secondary | ICD-10-CM | POA: Diagnosis not present

## 2014-09-19 DIAGNOSIS — M6281 Muscle weakness (generalized): Secondary | ICD-10-CM | POA: Diagnosis not present

## 2014-09-19 DIAGNOSIS — R079 Chest pain, unspecified: Secondary | ICD-10-CM | POA: Diagnosis not present

## 2014-09-20 DIAGNOSIS — M6281 Muscle weakness (generalized): Secondary | ICD-10-CM | POA: Diagnosis not present

## 2014-09-20 DIAGNOSIS — R2681 Unsteadiness on feet: Secondary | ICD-10-CM | POA: Diagnosis not present

## 2014-09-20 DIAGNOSIS — R262 Difficulty in walking, not elsewhere classified: Secondary | ICD-10-CM | POA: Diagnosis not present

## 2014-09-20 DIAGNOSIS — R079 Chest pain, unspecified: Secondary | ICD-10-CM | POA: Diagnosis not present

## 2014-09-23 DIAGNOSIS — R2681 Unsteadiness on feet: Secondary | ICD-10-CM | POA: Diagnosis not present

## 2014-09-23 DIAGNOSIS — R079 Chest pain, unspecified: Secondary | ICD-10-CM | POA: Diagnosis not present

## 2014-09-23 DIAGNOSIS — M6281 Muscle weakness (generalized): Secondary | ICD-10-CM | POA: Diagnosis not present

## 2014-09-23 DIAGNOSIS — R262 Difficulty in walking, not elsewhere classified: Secondary | ICD-10-CM | POA: Diagnosis not present

## 2014-09-24 DIAGNOSIS — R079 Chest pain, unspecified: Secondary | ICD-10-CM | POA: Diagnosis not present

## 2014-09-24 DIAGNOSIS — M6281 Muscle weakness (generalized): Secondary | ICD-10-CM | POA: Diagnosis not present

## 2014-09-24 DIAGNOSIS — R262 Difficulty in walking, not elsewhere classified: Secondary | ICD-10-CM | POA: Diagnosis not present

## 2014-09-24 DIAGNOSIS — R2681 Unsteadiness on feet: Secondary | ICD-10-CM | POA: Diagnosis not present

## 2014-09-25 DIAGNOSIS — M6281 Muscle weakness (generalized): Secondary | ICD-10-CM | POA: Diagnosis not present

## 2014-09-25 DIAGNOSIS — R079 Chest pain, unspecified: Secondary | ICD-10-CM | POA: Diagnosis not present

## 2014-09-25 DIAGNOSIS — R2681 Unsteadiness on feet: Secondary | ICD-10-CM | POA: Diagnosis not present

## 2014-09-25 DIAGNOSIS — R262 Difficulty in walking, not elsewhere classified: Secondary | ICD-10-CM | POA: Diagnosis not present

## 2014-09-26 DIAGNOSIS — E119 Type 2 diabetes mellitus without complications: Secondary | ICD-10-CM | POA: Diagnosis not present

## 2014-09-26 DIAGNOSIS — R079 Chest pain, unspecified: Secondary | ICD-10-CM | POA: Diagnosis not present

## 2014-09-26 DIAGNOSIS — M6281 Muscle weakness (generalized): Secondary | ICD-10-CM | POA: Diagnosis not present

## 2014-09-26 DIAGNOSIS — I2510 Atherosclerotic heart disease of native coronary artery without angina pectoris: Secondary | ICD-10-CM | POA: Diagnosis not present

## 2014-09-26 DIAGNOSIS — R262 Difficulty in walking, not elsewhere classified: Secondary | ICD-10-CM | POA: Diagnosis not present

## 2014-09-26 DIAGNOSIS — R030 Elevated blood-pressure reading, without diagnosis of hypertension: Secondary | ICD-10-CM | POA: Diagnosis not present

## 2014-09-26 DIAGNOSIS — R2681 Unsteadiness on feet: Secondary | ICD-10-CM | POA: Diagnosis not present

## 2014-09-27 DIAGNOSIS — R079 Chest pain, unspecified: Secondary | ICD-10-CM | POA: Diagnosis not present

## 2014-09-27 DIAGNOSIS — M6281 Muscle weakness (generalized): Secondary | ICD-10-CM | POA: Diagnosis not present

## 2014-09-27 DIAGNOSIS — R2681 Unsteadiness on feet: Secondary | ICD-10-CM | POA: Diagnosis not present

## 2014-09-27 DIAGNOSIS — R262 Difficulty in walking, not elsewhere classified: Secondary | ICD-10-CM | POA: Diagnosis not present

## 2014-09-30 DIAGNOSIS — R079 Chest pain, unspecified: Secondary | ICD-10-CM | POA: Diagnosis not present

## 2014-09-30 DIAGNOSIS — R2681 Unsteadiness on feet: Secondary | ICD-10-CM | POA: Diagnosis not present

## 2014-09-30 DIAGNOSIS — M6281 Muscle weakness (generalized): Secondary | ICD-10-CM | POA: Diagnosis not present

## 2014-09-30 DIAGNOSIS — R262 Difficulty in walking, not elsewhere classified: Secondary | ICD-10-CM | POA: Diagnosis not present

## 2014-10-01 DIAGNOSIS — R2681 Unsteadiness on feet: Secondary | ICD-10-CM | POA: Diagnosis not present

## 2014-10-01 DIAGNOSIS — M6281 Muscle weakness (generalized): Secondary | ICD-10-CM | POA: Diagnosis not present

## 2014-10-01 DIAGNOSIS — R079 Chest pain, unspecified: Secondary | ICD-10-CM | POA: Diagnosis not present

## 2014-10-01 DIAGNOSIS — R262 Difficulty in walking, not elsewhere classified: Secondary | ICD-10-CM | POA: Diagnosis not present

## 2014-10-02 DIAGNOSIS — R262 Difficulty in walking, not elsewhere classified: Secondary | ICD-10-CM | POA: Diagnosis not present

## 2014-10-02 DIAGNOSIS — R079 Chest pain, unspecified: Secondary | ICD-10-CM | POA: Diagnosis not present

## 2014-10-02 DIAGNOSIS — M6281 Muscle weakness (generalized): Secondary | ICD-10-CM | POA: Diagnosis not present

## 2014-10-02 DIAGNOSIS — R2681 Unsteadiness on feet: Secondary | ICD-10-CM | POA: Diagnosis not present

## 2014-10-03 DIAGNOSIS — R2681 Unsteadiness on feet: Secondary | ICD-10-CM | POA: Diagnosis not present

## 2014-10-03 DIAGNOSIS — M6281 Muscle weakness (generalized): Secondary | ICD-10-CM | POA: Diagnosis not present

## 2014-10-03 DIAGNOSIS — R262 Difficulty in walking, not elsewhere classified: Secondary | ICD-10-CM | POA: Diagnosis not present

## 2014-10-03 DIAGNOSIS — R079 Chest pain, unspecified: Secondary | ICD-10-CM | POA: Diagnosis not present

## 2014-10-04 DIAGNOSIS — R079 Chest pain, unspecified: Secondary | ICD-10-CM | POA: Diagnosis not present

## 2014-10-04 DIAGNOSIS — R2681 Unsteadiness on feet: Secondary | ICD-10-CM | POA: Diagnosis not present

## 2014-10-04 DIAGNOSIS — R262 Difficulty in walking, not elsewhere classified: Secondary | ICD-10-CM | POA: Diagnosis not present

## 2014-10-04 DIAGNOSIS — M6281 Muscle weakness (generalized): Secondary | ICD-10-CM | POA: Diagnosis not present

## 2014-10-06 DIAGNOSIS — F29 Unspecified psychosis not due to a substance or known physiological condition: Secondary | ICD-10-CM | POA: Diagnosis not present

## 2014-10-06 DIAGNOSIS — F0391 Unspecified dementia with behavioral disturbance: Secondary | ICD-10-CM | POA: Diagnosis not present

## 2014-10-06 DIAGNOSIS — F328 Other depressive episodes: Secondary | ICD-10-CM | POA: Diagnosis not present

## 2014-10-07 DIAGNOSIS — M6281 Muscle weakness (generalized): Secondary | ICD-10-CM | POA: Diagnosis not present

## 2014-10-07 DIAGNOSIS — R079 Chest pain, unspecified: Secondary | ICD-10-CM | POA: Diagnosis not present

## 2014-10-07 DIAGNOSIS — R2681 Unsteadiness on feet: Secondary | ICD-10-CM | POA: Diagnosis not present

## 2014-10-07 DIAGNOSIS — R262 Difficulty in walking, not elsewhere classified: Secondary | ICD-10-CM | POA: Diagnosis not present

## 2014-10-08 DIAGNOSIS — Z79899 Other long term (current) drug therapy: Secondary | ICD-10-CM | POA: Diagnosis not present

## 2014-10-08 DIAGNOSIS — E559 Vitamin D deficiency, unspecified: Secondary | ICD-10-CM | POA: Diagnosis not present

## 2014-10-08 DIAGNOSIS — E119 Type 2 diabetes mellitus without complications: Secondary | ICD-10-CM | POA: Diagnosis not present

## 2014-10-08 DIAGNOSIS — R4182 Altered mental status, unspecified: Secondary | ICD-10-CM | POA: Diagnosis not present

## 2014-10-08 DIAGNOSIS — R262 Difficulty in walking, not elsewhere classified: Secondary | ICD-10-CM | POA: Diagnosis not present

## 2014-10-08 DIAGNOSIS — R319 Hematuria, unspecified: Secondary | ICD-10-CM | POA: Diagnosis not present

## 2014-10-08 DIAGNOSIS — E039 Hypothyroidism, unspecified: Secondary | ICD-10-CM | POA: Diagnosis not present

## 2014-10-08 DIAGNOSIS — R079 Chest pain, unspecified: Secondary | ICD-10-CM | POA: Diagnosis not present

## 2014-10-08 DIAGNOSIS — R2681 Unsteadiness on feet: Secondary | ICD-10-CM | POA: Diagnosis not present

## 2014-10-08 DIAGNOSIS — E785 Hyperlipidemia, unspecified: Secondary | ICD-10-CM | POA: Diagnosis not present

## 2014-10-08 DIAGNOSIS — D649 Anemia, unspecified: Secondary | ICD-10-CM | POA: Diagnosis not present

## 2014-10-08 DIAGNOSIS — M6281 Muscle weakness (generalized): Secondary | ICD-10-CM | POA: Diagnosis not present

## 2014-10-08 DIAGNOSIS — N390 Urinary tract infection, site not specified: Secondary | ICD-10-CM | POA: Diagnosis not present

## 2014-10-08 DIAGNOSIS — R5081 Fever presenting with conditions classified elsewhere: Secondary | ICD-10-CM | POA: Diagnosis not present

## 2014-10-09 DIAGNOSIS — R2681 Unsteadiness on feet: Secondary | ICD-10-CM | POA: Diagnosis not present

## 2014-10-09 DIAGNOSIS — M6281 Muscle weakness (generalized): Secondary | ICD-10-CM | POA: Diagnosis not present

## 2014-10-09 DIAGNOSIS — R079 Chest pain, unspecified: Secondary | ICD-10-CM | POA: Diagnosis not present

## 2014-10-09 DIAGNOSIS — E559 Vitamin D deficiency, unspecified: Secondary | ICD-10-CM | POA: Diagnosis not present

## 2014-10-09 DIAGNOSIS — I2510 Atherosclerotic heart disease of native coronary artery without angina pectoris: Secondary | ICD-10-CM | POA: Diagnosis not present

## 2014-10-09 DIAGNOSIS — N390 Urinary tract infection, site not specified: Secondary | ICD-10-CM | POA: Diagnosis not present

## 2014-10-09 DIAGNOSIS — R262 Difficulty in walking, not elsewhere classified: Secondary | ICD-10-CM | POA: Diagnosis not present

## 2014-10-09 DIAGNOSIS — I10 Essential (primary) hypertension: Secondary | ICD-10-CM | POA: Diagnosis not present

## 2014-10-09 DIAGNOSIS — R030 Elevated blood-pressure reading, without diagnosis of hypertension: Secondary | ICD-10-CM | POA: Diagnosis not present

## 2014-10-10 DIAGNOSIS — I10 Essential (primary) hypertension: Secondary | ICD-10-CM | POA: Diagnosis not present

## 2014-10-10 DIAGNOSIS — R079 Chest pain, unspecified: Secondary | ICD-10-CM | POA: Diagnosis not present

## 2014-10-10 DIAGNOSIS — R2681 Unsteadiness on feet: Secondary | ICD-10-CM | POA: Diagnosis not present

## 2014-10-10 DIAGNOSIS — N390 Urinary tract infection, site not specified: Secondary | ICD-10-CM | POA: Diagnosis not present

## 2014-10-10 DIAGNOSIS — R262 Difficulty in walking, not elsewhere classified: Secondary | ICD-10-CM | POA: Diagnosis not present

## 2014-10-10 DIAGNOSIS — M6281 Muscle weakness (generalized): Secondary | ICD-10-CM | POA: Diagnosis not present

## 2014-10-10 DIAGNOSIS — R030 Elevated blood-pressure reading, without diagnosis of hypertension: Secondary | ICD-10-CM | POA: Diagnosis not present

## 2014-10-10 DIAGNOSIS — I2510 Atherosclerotic heart disease of native coronary artery without angina pectoris: Secondary | ICD-10-CM | POA: Diagnosis not present

## 2014-10-10 DIAGNOSIS — E559 Vitamin D deficiency, unspecified: Secondary | ICD-10-CM | POA: Diagnosis not present

## 2014-10-12 DIAGNOSIS — R262 Difficulty in walking, not elsewhere classified: Secondary | ICD-10-CM | POA: Diagnosis not present

## 2014-10-12 DIAGNOSIS — R079 Chest pain, unspecified: Secondary | ICD-10-CM | POA: Diagnosis not present

## 2014-10-12 DIAGNOSIS — R2681 Unsteadiness on feet: Secondary | ICD-10-CM | POA: Diagnosis not present

## 2014-10-12 DIAGNOSIS — M6281 Muscle weakness (generalized): Secondary | ICD-10-CM | POA: Diagnosis not present

## 2014-10-14 DIAGNOSIS — R262 Difficulty in walking, not elsewhere classified: Secondary | ICD-10-CM | POA: Diagnosis not present

## 2014-10-14 DIAGNOSIS — R2681 Unsteadiness on feet: Secondary | ICD-10-CM | POA: Diagnosis not present

## 2014-10-14 DIAGNOSIS — R079 Chest pain, unspecified: Secondary | ICD-10-CM | POA: Diagnosis not present

## 2014-10-14 DIAGNOSIS — M6281 Muscle weakness (generalized): Secondary | ICD-10-CM | POA: Diagnosis not present

## 2014-11-06 DIAGNOSIS — E1051 Type 1 diabetes mellitus with diabetic peripheral angiopathy without gangrene: Secondary | ICD-10-CM | POA: Diagnosis not present

## 2014-11-09 DIAGNOSIS — F29 Unspecified psychosis not due to a substance or known physiological condition: Secondary | ICD-10-CM | POA: Diagnosis not present

## 2014-11-09 DIAGNOSIS — F0391 Unspecified dementia with behavioral disturbance: Secondary | ICD-10-CM | POA: Diagnosis not present

## 2014-11-13 DIAGNOSIS — M6281 Muscle weakness (generalized): Secondary | ICD-10-CM | POA: Diagnosis not present

## 2014-11-13 DIAGNOSIS — R2681 Unsteadiness on feet: Secondary | ICD-10-CM | POA: Diagnosis not present

## 2014-11-13 DIAGNOSIS — R262 Difficulty in walking, not elsewhere classified: Secondary | ICD-10-CM | POA: Diagnosis not present

## 2014-11-13 DIAGNOSIS — R079 Chest pain, unspecified: Secondary | ICD-10-CM | POA: Diagnosis not present

## 2014-11-25 DIAGNOSIS — I639 Cerebral infarction, unspecified: Secondary | ICD-10-CM | POA: Diagnosis not present

## 2014-11-25 DIAGNOSIS — E039 Hypothyroidism, unspecified: Secondary | ICD-10-CM | POA: Diagnosis not present

## 2014-11-25 DIAGNOSIS — K5900 Constipation, unspecified: Secondary | ICD-10-CM | POA: Diagnosis not present

## 2014-11-25 DIAGNOSIS — I2510 Atherosclerotic heart disease of native coronary artery without angina pectoris: Secondary | ICD-10-CM | POA: Diagnosis not present

## 2014-12-08 DIAGNOSIS — F328 Other depressive episodes: Secondary | ICD-10-CM | POA: Diagnosis not present

## 2014-12-08 DIAGNOSIS — F29 Unspecified psychosis not due to a substance or known physiological condition: Secondary | ICD-10-CM | POA: Diagnosis not present

## 2014-12-08 DIAGNOSIS — F0391 Unspecified dementia with behavioral disturbance: Secondary | ICD-10-CM | POA: Diagnosis not present

## 2015-01-11 DIAGNOSIS — R300 Dysuria: Secondary | ICD-10-CM | POA: Diagnosis not present

## 2015-01-11 DIAGNOSIS — F29 Unspecified psychosis not due to a substance or known physiological condition: Secondary | ICD-10-CM | POA: Diagnosis not present

## 2015-01-13 DIAGNOSIS — R300 Dysuria: Secondary | ICD-10-CM | POA: Diagnosis not present

## 2015-01-13 DIAGNOSIS — R319 Hematuria, unspecified: Secondary | ICD-10-CM | POA: Diagnosis not present

## 2015-01-13 DIAGNOSIS — N390 Urinary tract infection, site not specified: Secondary | ICD-10-CM | POA: Diagnosis not present

## 2015-01-14 DIAGNOSIS — D649 Anemia, unspecified: Secondary | ICD-10-CM | POA: Diagnosis not present

## 2015-01-14 DIAGNOSIS — E119 Type 2 diabetes mellitus without complications: Secondary | ICD-10-CM | POA: Diagnosis not present

## 2015-01-14 DIAGNOSIS — E559 Vitamin D deficiency, unspecified: Secondary | ICD-10-CM | POA: Diagnosis not present

## 2015-01-14 DIAGNOSIS — R109 Unspecified abdominal pain: Secondary | ICD-10-CM | POA: Diagnosis not present

## 2015-01-14 DIAGNOSIS — R509 Fever, unspecified: Secondary | ICD-10-CM | POA: Diagnosis not present

## 2015-01-14 DIAGNOSIS — N390 Urinary tract infection, site not specified: Secondary | ICD-10-CM | POA: Diagnosis not present

## 2015-01-14 DIAGNOSIS — R4182 Altered mental status, unspecified: Secondary | ICD-10-CM | POA: Diagnosis not present

## 2015-01-14 DIAGNOSIS — E860 Dehydration: Secondary | ICD-10-CM | POA: Diagnosis not present

## 2015-01-14 DIAGNOSIS — E785 Hyperlipidemia, unspecified: Secondary | ICD-10-CM | POA: Diagnosis not present

## 2015-01-14 DIAGNOSIS — Z79899 Other long term (current) drug therapy: Secondary | ICD-10-CM | POA: Diagnosis not present

## 2015-01-14 DIAGNOSIS — E039 Hypothyroidism, unspecified: Secondary | ICD-10-CM | POA: Diagnosis not present

## 2015-01-15 DIAGNOSIS — R109 Unspecified abdominal pain: Secondary | ICD-10-CM | POA: Diagnosis not present

## 2015-01-15 DIAGNOSIS — R509 Fever, unspecified: Secondary | ICD-10-CM | POA: Diagnosis not present

## 2015-01-15 DIAGNOSIS — E039 Hypothyroidism, unspecified: Secondary | ICD-10-CM | POA: Diagnosis not present

## 2015-01-15 DIAGNOSIS — E119 Type 2 diabetes mellitus without complications: Secondary | ICD-10-CM | POA: Diagnosis not present

## 2015-01-15 DIAGNOSIS — E860 Dehydration: Secondary | ICD-10-CM | POA: Diagnosis not present

## 2015-01-15 DIAGNOSIS — E785 Hyperlipidemia, unspecified: Secondary | ICD-10-CM | POA: Diagnosis not present

## 2015-01-15 DIAGNOSIS — E559 Vitamin D deficiency, unspecified: Secondary | ICD-10-CM | POA: Diagnosis not present

## 2015-01-15 DIAGNOSIS — N390 Urinary tract infection, site not specified: Secondary | ICD-10-CM | POA: Diagnosis not present

## 2015-01-18 DIAGNOSIS — E785 Hyperlipidemia, unspecified: Secondary | ICD-10-CM | POA: Diagnosis not present

## 2015-01-18 DIAGNOSIS — R109 Unspecified abdominal pain: Secondary | ICD-10-CM | POA: Diagnosis not present

## 2015-01-18 DIAGNOSIS — E119 Type 2 diabetes mellitus without complications: Secondary | ICD-10-CM | POA: Diagnosis not present

## 2015-01-18 DIAGNOSIS — E860 Dehydration: Secondary | ICD-10-CM | POA: Diagnosis not present

## 2015-01-18 DIAGNOSIS — N390 Urinary tract infection, site not specified: Secondary | ICD-10-CM | POA: Diagnosis not present

## 2015-01-18 DIAGNOSIS — R509 Fever, unspecified: Secondary | ICD-10-CM | POA: Diagnosis not present

## 2015-01-18 DIAGNOSIS — E559 Vitamin D deficiency, unspecified: Secondary | ICD-10-CM | POA: Diagnosis not present

## 2015-01-18 DIAGNOSIS — E039 Hypothyroidism, unspecified: Secondary | ICD-10-CM | POA: Diagnosis not present

## 2015-01-19 DIAGNOSIS — R0989 Other specified symptoms and signs involving the circulatory and respiratory systems: Secondary | ICD-10-CM | POA: Diagnosis not present

## 2015-01-19 DIAGNOSIS — R109 Unspecified abdominal pain: Secondary | ICD-10-CM | POA: Diagnosis not present

## 2015-01-20 DIAGNOSIS — N390 Urinary tract infection, site not specified: Secondary | ICD-10-CM | POA: Diagnosis not present

## 2015-01-20 DIAGNOSIS — E860 Dehydration: Secondary | ICD-10-CM | POA: Diagnosis not present

## 2015-01-20 DIAGNOSIS — E119 Type 2 diabetes mellitus without complications: Secondary | ICD-10-CM | POA: Diagnosis not present

## 2015-01-20 DIAGNOSIS — D72829 Elevated white blood cell count, unspecified: Secondary | ICD-10-CM | POA: Diagnosis not present

## 2015-01-20 DIAGNOSIS — R319 Hematuria, unspecified: Secondary | ICD-10-CM | POA: Diagnosis not present

## 2015-01-22 DIAGNOSIS — E119 Type 2 diabetes mellitus without complications: Secondary | ICD-10-CM | POA: Diagnosis not present

## 2015-01-22 DIAGNOSIS — Z79899 Other long term (current) drug therapy: Secondary | ICD-10-CM | POA: Diagnosis not present

## 2015-01-22 DIAGNOSIS — F29 Unspecified psychosis not due to a substance or known physiological condition: Secondary | ICD-10-CM | POA: Diagnosis not present

## 2015-01-22 DIAGNOSIS — K219 Gastro-esophageal reflux disease without esophagitis: Secondary | ICD-10-CM | POA: Diagnosis not present

## 2015-01-22 DIAGNOSIS — M2550 Pain in unspecified joint: Secondary | ICD-10-CM | POA: Diagnosis not present

## 2015-01-22 DIAGNOSIS — D72829 Elevated white blood cell count, unspecified: Secondary | ICD-10-CM | POA: Diagnosis not present

## 2015-01-22 DIAGNOSIS — E785 Hyperlipidemia, unspecified: Secondary | ICD-10-CM | POA: Diagnosis not present

## 2015-01-22 DIAGNOSIS — D649 Anemia, unspecified: Secondary | ICD-10-CM | POA: Diagnosis not present

## 2015-01-22 DIAGNOSIS — E860 Dehydration: Secondary | ICD-10-CM | POA: Diagnosis not present

## 2015-01-27 DIAGNOSIS — M6281 Muscle weakness (generalized): Secondary | ICD-10-CM | POA: Diagnosis not present

## 2015-01-27 DIAGNOSIS — R2681 Unsteadiness on feet: Secondary | ICD-10-CM | POA: Diagnosis not present

## 2015-01-27 DIAGNOSIS — R262 Difficulty in walking, not elsewhere classified: Secondary | ICD-10-CM | POA: Diagnosis not present

## 2015-01-27 DIAGNOSIS — R079 Chest pain, unspecified: Secondary | ICD-10-CM | POA: Diagnosis not present

## 2015-01-28 DIAGNOSIS — D649 Anemia, unspecified: Secondary | ICD-10-CM | POA: Diagnosis not present

## 2015-01-28 DIAGNOSIS — R262 Difficulty in walking, not elsewhere classified: Secondary | ICD-10-CM | POA: Diagnosis not present

## 2015-01-28 DIAGNOSIS — M2550 Pain in unspecified joint: Secondary | ICD-10-CM | POA: Diagnosis not present

## 2015-01-28 DIAGNOSIS — Z79899 Other long term (current) drug therapy: Secondary | ICD-10-CM | POA: Diagnosis not present

## 2015-01-28 DIAGNOSIS — D72829 Elevated white blood cell count, unspecified: Secondary | ICD-10-CM | POA: Diagnosis not present

## 2015-01-28 DIAGNOSIS — M6281 Muscle weakness (generalized): Secondary | ICD-10-CM | POA: Diagnosis not present

## 2015-01-28 DIAGNOSIS — I10 Essential (primary) hypertension: Secondary | ICD-10-CM | POA: Diagnosis not present

## 2015-01-28 DIAGNOSIS — R2681 Unsteadiness on feet: Secondary | ICD-10-CM | POA: Diagnosis not present

## 2015-01-28 DIAGNOSIS — R079 Chest pain, unspecified: Secondary | ICD-10-CM | POA: Diagnosis not present

## 2015-01-29 DIAGNOSIS — R2681 Unsteadiness on feet: Secondary | ICD-10-CM | POA: Diagnosis not present

## 2015-01-29 DIAGNOSIS — D72829 Elevated white blood cell count, unspecified: Secondary | ICD-10-CM | POA: Diagnosis not present

## 2015-01-29 DIAGNOSIS — M6281 Muscle weakness (generalized): Secondary | ICD-10-CM | POA: Diagnosis not present

## 2015-01-29 DIAGNOSIS — D649 Anemia, unspecified: Secondary | ICD-10-CM | POA: Diagnosis not present

## 2015-01-29 DIAGNOSIS — R262 Difficulty in walking, not elsewhere classified: Secondary | ICD-10-CM | POA: Diagnosis not present

## 2015-01-29 DIAGNOSIS — R079 Chest pain, unspecified: Secondary | ICD-10-CM | POA: Diagnosis not present

## 2015-01-30 DIAGNOSIS — M6281 Muscle weakness (generalized): Secondary | ICD-10-CM | POA: Diagnosis not present

## 2015-01-30 DIAGNOSIS — R2681 Unsteadiness on feet: Secondary | ICD-10-CM | POA: Diagnosis not present

## 2015-01-30 DIAGNOSIS — R262 Difficulty in walking, not elsewhere classified: Secondary | ICD-10-CM | POA: Diagnosis not present

## 2015-01-30 DIAGNOSIS — R079 Chest pain, unspecified: Secondary | ICD-10-CM | POA: Diagnosis not present

## 2015-01-31 DIAGNOSIS — M6281 Muscle weakness (generalized): Secondary | ICD-10-CM | POA: Diagnosis not present

## 2015-01-31 DIAGNOSIS — R2681 Unsteadiness on feet: Secondary | ICD-10-CM | POA: Diagnosis not present

## 2015-01-31 DIAGNOSIS — R079 Chest pain, unspecified: Secondary | ICD-10-CM | POA: Diagnosis not present

## 2015-01-31 DIAGNOSIS — R262 Difficulty in walking, not elsewhere classified: Secondary | ICD-10-CM | POA: Diagnosis not present

## 2015-02-02 DIAGNOSIS — F29 Unspecified psychosis not due to a substance or known physiological condition: Secondary | ICD-10-CM | POA: Diagnosis not present

## 2015-02-02 DIAGNOSIS — F0391 Unspecified dementia with behavioral disturbance: Secondary | ICD-10-CM | POA: Diagnosis not present

## 2015-02-02 DIAGNOSIS — F328 Other depressive episodes: Secondary | ICD-10-CM | POA: Diagnosis not present

## 2015-02-03 DIAGNOSIS — R262 Difficulty in walking, not elsewhere classified: Secondary | ICD-10-CM | POA: Diagnosis not present

## 2015-02-03 DIAGNOSIS — M6281 Muscle weakness (generalized): Secondary | ICD-10-CM | POA: Diagnosis not present

## 2015-02-03 DIAGNOSIS — R079 Chest pain, unspecified: Secondary | ICD-10-CM | POA: Diagnosis not present

## 2015-02-03 DIAGNOSIS — R2681 Unsteadiness on feet: Secondary | ICD-10-CM | POA: Diagnosis not present

## 2015-02-04 DIAGNOSIS — R079 Chest pain, unspecified: Secondary | ICD-10-CM | POA: Diagnosis not present

## 2015-02-04 DIAGNOSIS — R2681 Unsteadiness on feet: Secondary | ICD-10-CM | POA: Diagnosis not present

## 2015-02-04 DIAGNOSIS — R262 Difficulty in walking, not elsewhere classified: Secondary | ICD-10-CM | POA: Diagnosis not present

## 2015-02-04 DIAGNOSIS — M6281 Muscle weakness (generalized): Secondary | ICD-10-CM | POA: Diagnosis not present

## 2015-02-05 DIAGNOSIS — M6281 Muscle weakness (generalized): Secondary | ICD-10-CM | POA: Diagnosis not present

## 2015-02-05 DIAGNOSIS — R2681 Unsteadiness on feet: Secondary | ICD-10-CM | POA: Diagnosis not present

## 2015-02-05 DIAGNOSIS — R079 Chest pain, unspecified: Secondary | ICD-10-CM | POA: Diagnosis not present

## 2015-02-05 DIAGNOSIS — R262 Difficulty in walking, not elsewhere classified: Secondary | ICD-10-CM | POA: Diagnosis not present

## 2015-02-06 DIAGNOSIS — R262 Difficulty in walking, not elsewhere classified: Secondary | ICD-10-CM | POA: Diagnosis not present

## 2015-02-06 DIAGNOSIS — R079 Chest pain, unspecified: Secondary | ICD-10-CM | POA: Diagnosis not present

## 2015-02-06 DIAGNOSIS — M6281 Muscle weakness (generalized): Secondary | ICD-10-CM | POA: Diagnosis not present

## 2015-02-06 DIAGNOSIS — R2681 Unsteadiness on feet: Secondary | ICD-10-CM | POA: Diagnosis not present

## 2015-02-07 DIAGNOSIS — M6281 Muscle weakness (generalized): Secondary | ICD-10-CM | POA: Diagnosis not present

## 2015-02-07 DIAGNOSIS — R079 Chest pain, unspecified: Secondary | ICD-10-CM | POA: Diagnosis not present

## 2015-02-07 DIAGNOSIS — R262 Difficulty in walking, not elsewhere classified: Secondary | ICD-10-CM | POA: Diagnosis not present

## 2015-02-07 DIAGNOSIS — R2681 Unsteadiness on feet: Secondary | ICD-10-CM | POA: Diagnosis not present

## 2015-02-10 DIAGNOSIS — R079 Chest pain, unspecified: Secondary | ICD-10-CM | POA: Diagnosis not present

## 2015-02-10 DIAGNOSIS — R2681 Unsteadiness on feet: Secondary | ICD-10-CM | POA: Diagnosis not present

## 2015-02-10 DIAGNOSIS — M6281 Muscle weakness (generalized): Secondary | ICD-10-CM | POA: Diagnosis not present

## 2015-02-10 DIAGNOSIS — R262 Difficulty in walking, not elsewhere classified: Secondary | ICD-10-CM | POA: Diagnosis not present

## 2015-02-11 DIAGNOSIS — R079 Chest pain, unspecified: Secondary | ICD-10-CM | POA: Diagnosis not present

## 2015-02-11 DIAGNOSIS — R2681 Unsteadiness on feet: Secondary | ICD-10-CM | POA: Diagnosis not present

## 2015-02-11 DIAGNOSIS — M6281 Muscle weakness (generalized): Secondary | ICD-10-CM | POA: Diagnosis not present

## 2015-02-11 DIAGNOSIS — R262 Difficulty in walking, not elsewhere classified: Secondary | ICD-10-CM | POA: Diagnosis not present

## 2015-02-12 DIAGNOSIS — R2681 Unsteadiness on feet: Secondary | ICD-10-CM | POA: Diagnosis not present

## 2015-02-12 DIAGNOSIS — M6281 Muscle weakness (generalized): Secondary | ICD-10-CM | POA: Diagnosis not present

## 2015-02-12 DIAGNOSIS — R079 Chest pain, unspecified: Secondary | ICD-10-CM | POA: Diagnosis not present

## 2015-02-12 DIAGNOSIS — R262 Difficulty in walking, not elsewhere classified: Secondary | ICD-10-CM | POA: Diagnosis not present

## 2015-02-13 DIAGNOSIS — M6281 Muscle weakness (generalized): Secondary | ICD-10-CM | POA: Diagnosis not present

## 2015-02-13 DIAGNOSIS — R2681 Unsteadiness on feet: Secondary | ICD-10-CM | POA: Diagnosis not present

## 2015-02-13 DIAGNOSIS — R079 Chest pain, unspecified: Secondary | ICD-10-CM | POA: Diagnosis not present

## 2015-02-13 DIAGNOSIS — R262 Difficulty in walking, not elsewhere classified: Secondary | ICD-10-CM | POA: Diagnosis not present

## 2015-02-16 IMAGING — CR DG LUMBAR SPINE COMPLETE 4+V
5 series · 5 of 5 positions shown · non-contrast
Comparison: CT of the abdomen and pelvis 08/07/2009.

CLINICAL DATA: History of trauma from a fall.  Back pain.

EXAM:
LUMBAR SPINE - COMPLETE 4+ VIEW

[view not recorded (1 of 5)]
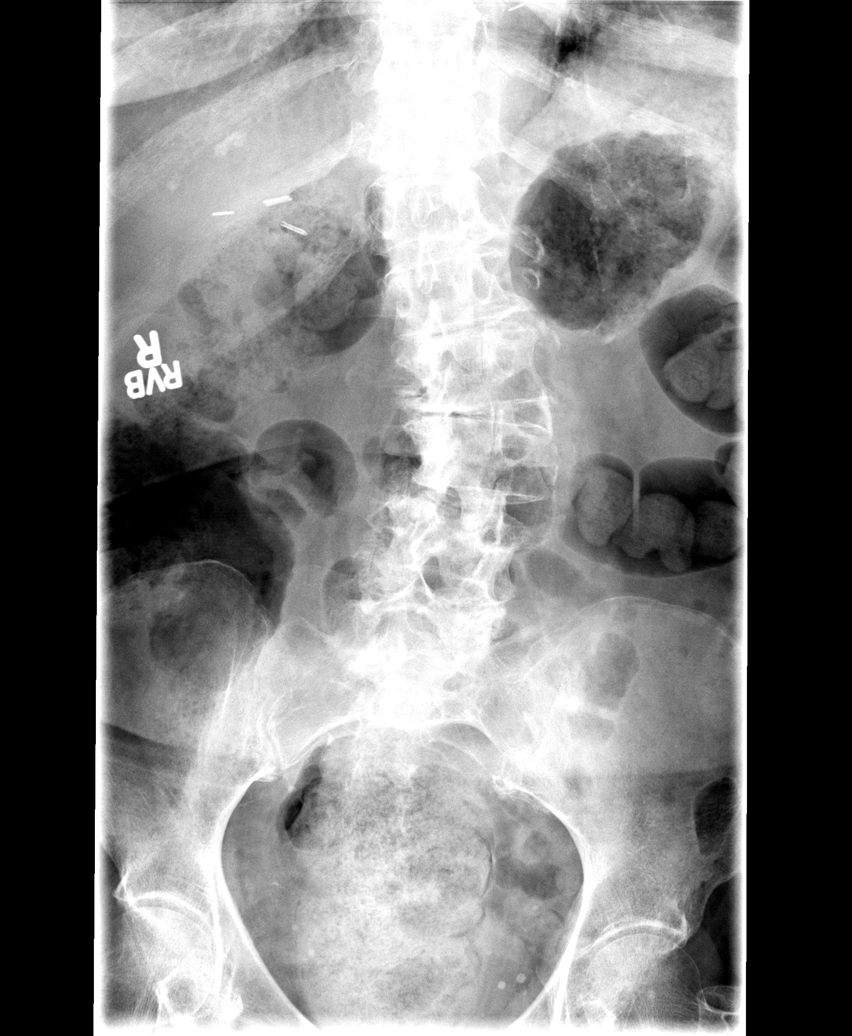

[view not recorded (2 of 5)]
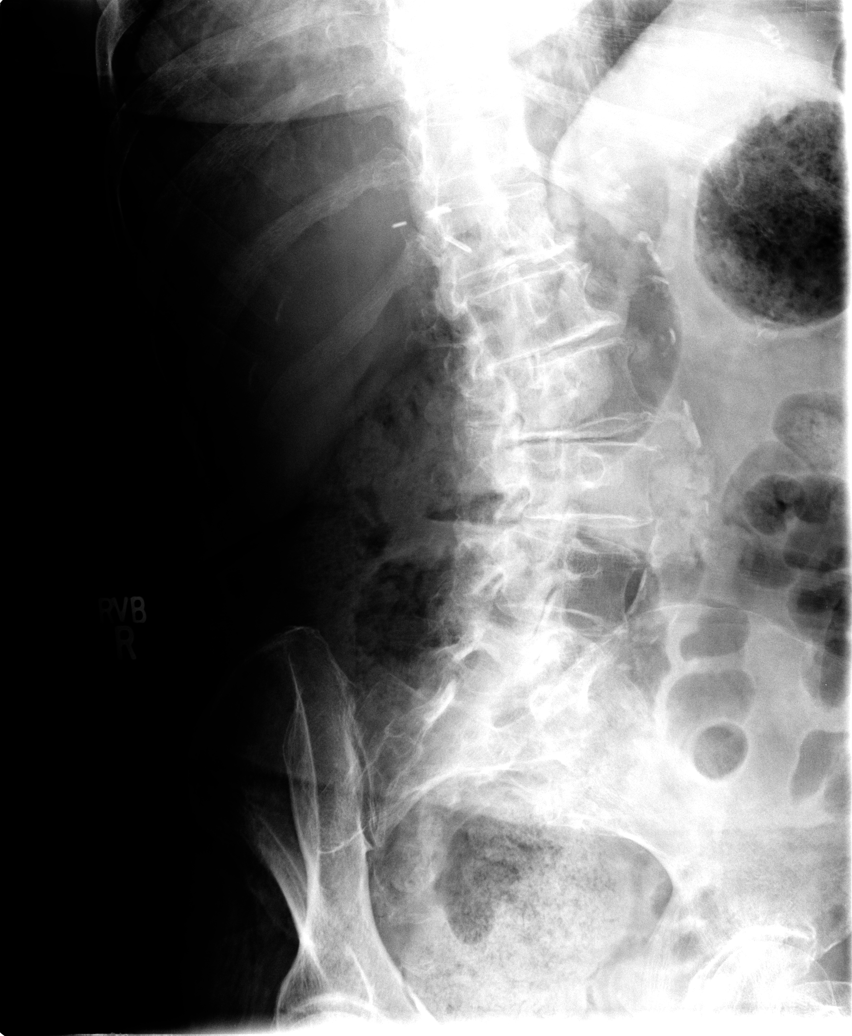

[view not recorded (3 of 5)]
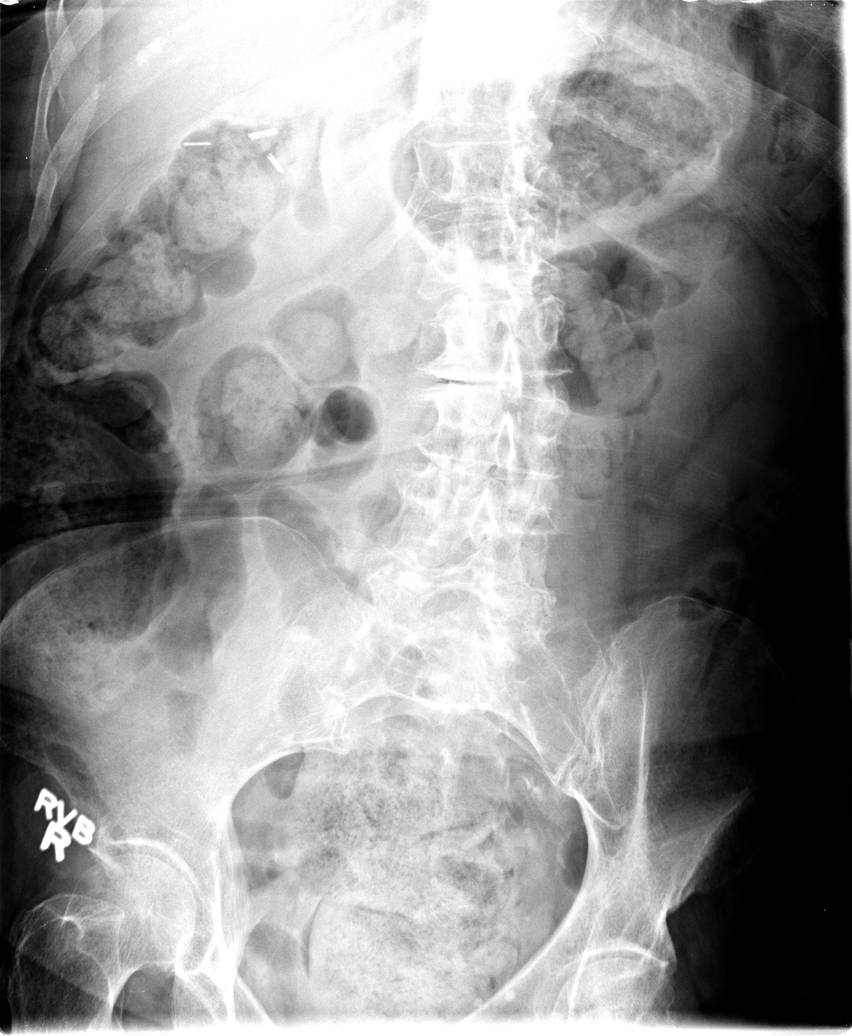

[view not recorded (4 of 5)]
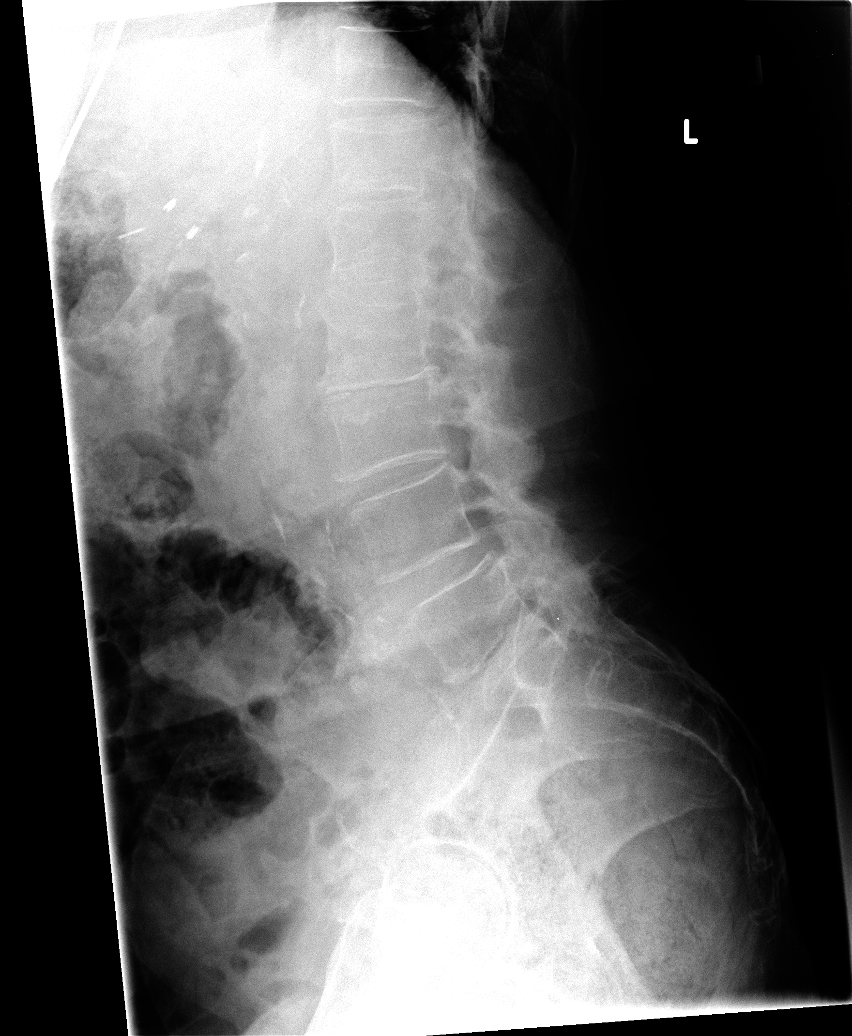

[view not recorded (5 of 5)]
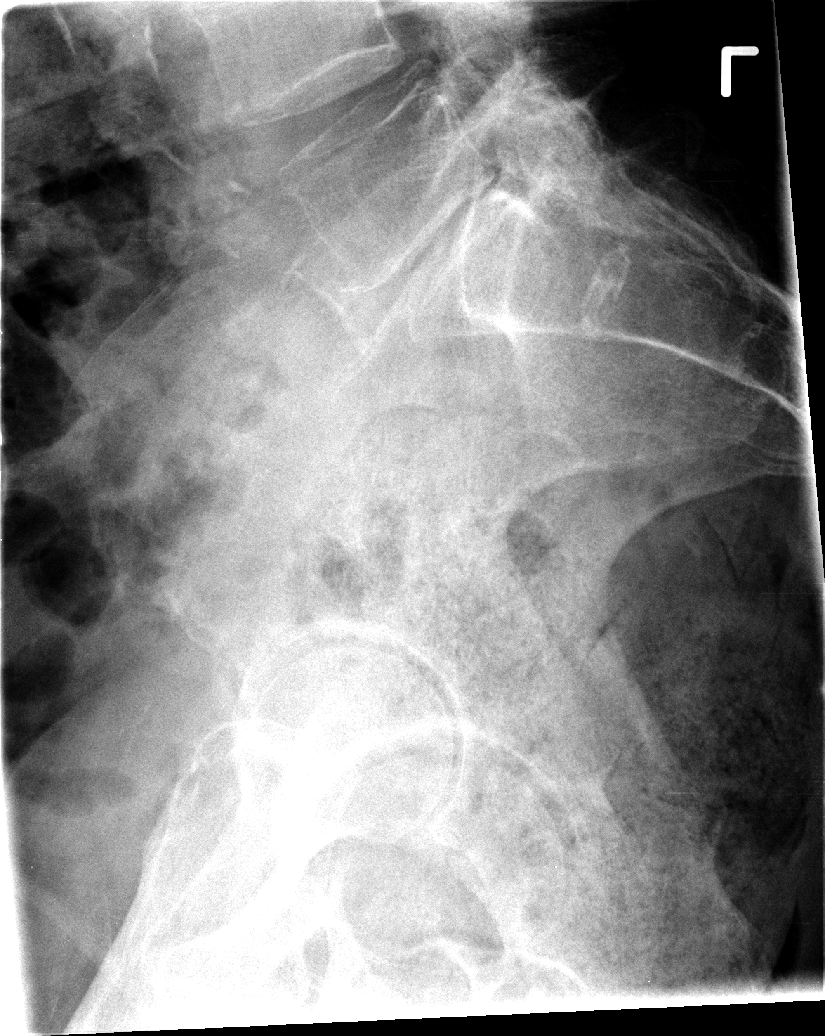

[5 of 5 positions shown; findings below may reference images not displayed]

FINDINGS: Levoscoliosis of the lumbar spine convex to the left at the level of
L3. Severe multilevel degenerative disc disease, most severe at
L1-L2, L2-L3 and L5-S1. Severe multilevel facet arthropathy, most
pronounced at L5-S1. No definite acute displaced fracture or
compression type fracture. Extensive atherosclerosis. Multiple
surgical clips throughout the right upper quadrant related to prior
cholecystectomy.
IMPRESSION: 1. No acute radiographic abnormality of the lumbar spine.
2. Multilevel degenerative disc disease, lumbar spondylosis and
lumbar levoscoliosis.
3. Atherosclerosis.

## 2015-02-16 IMAGING — CR DG FOREARM 2V*R*
2 series · 2 of 2 positions shown · non-contrast
Comparison: None.

CLINICAL DATA: Fell.  Right forearm pain.

EXAM:
RIGHT FOREARM - 2 VIEW

[view not recorded (1 of 2)]
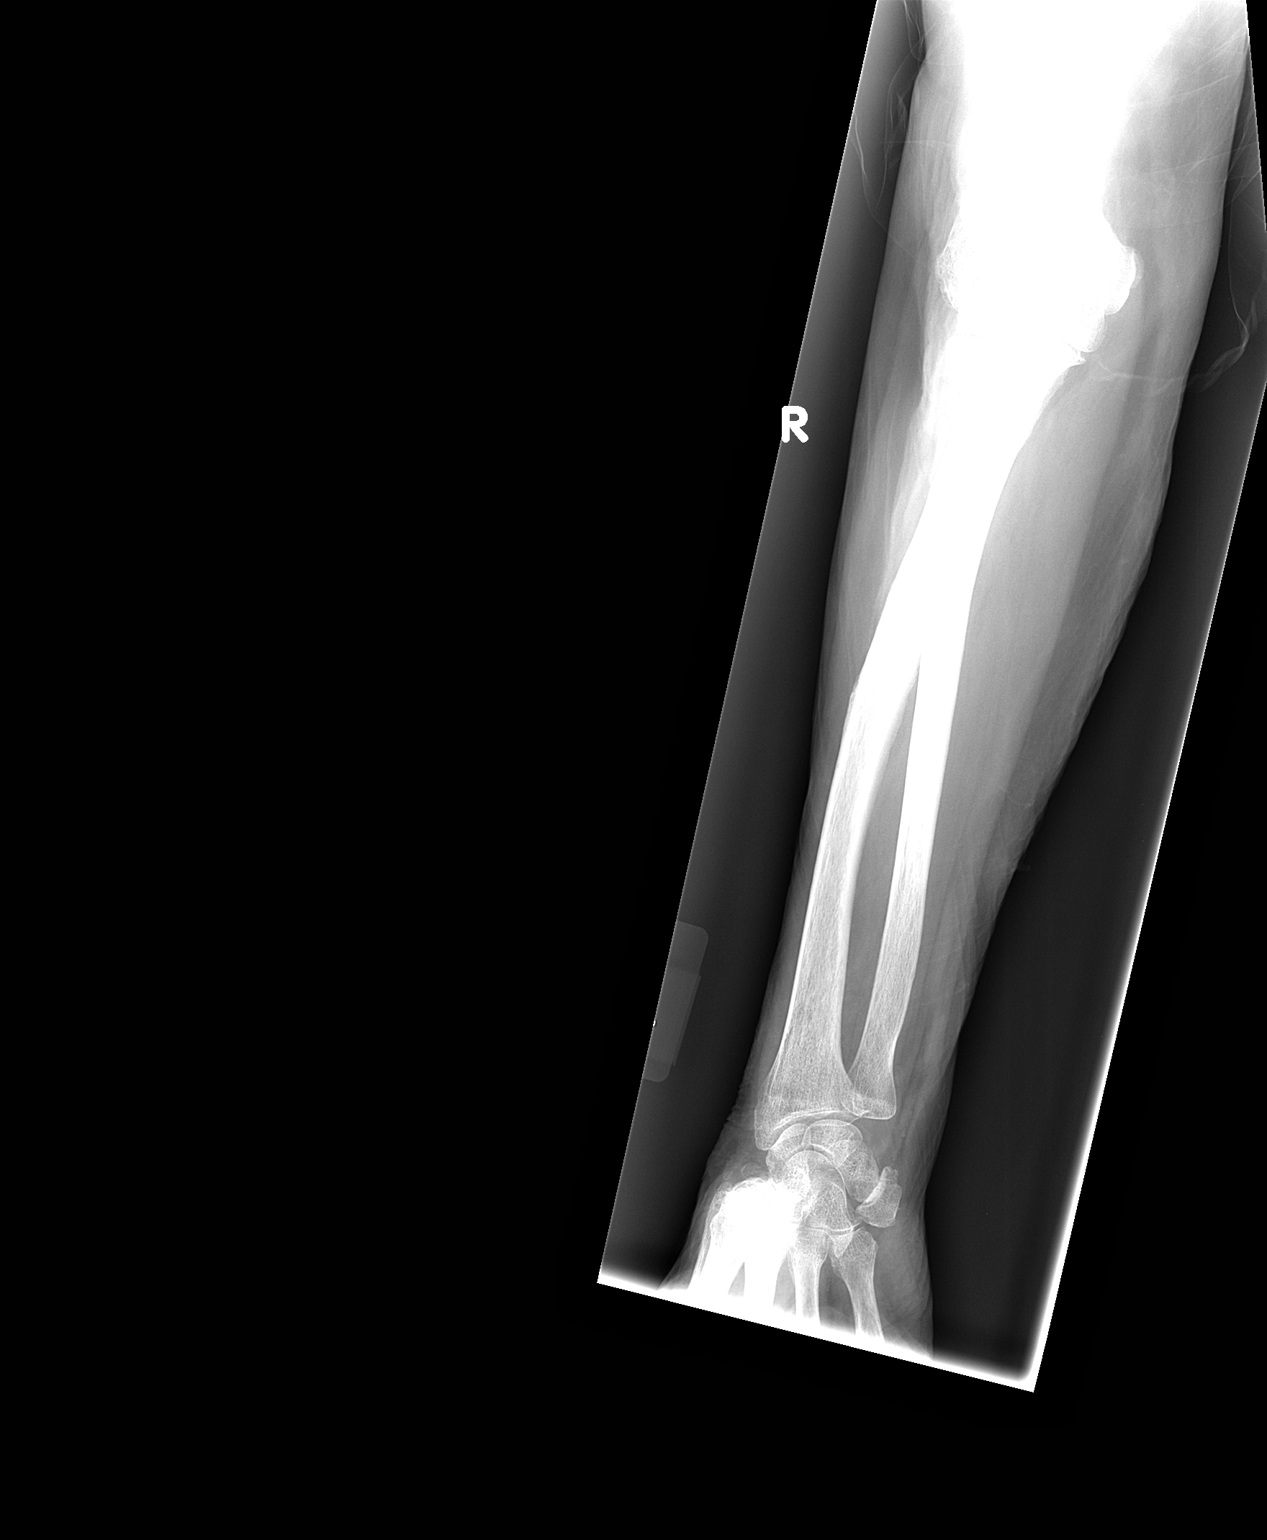

[view not recorded (2 of 2)]
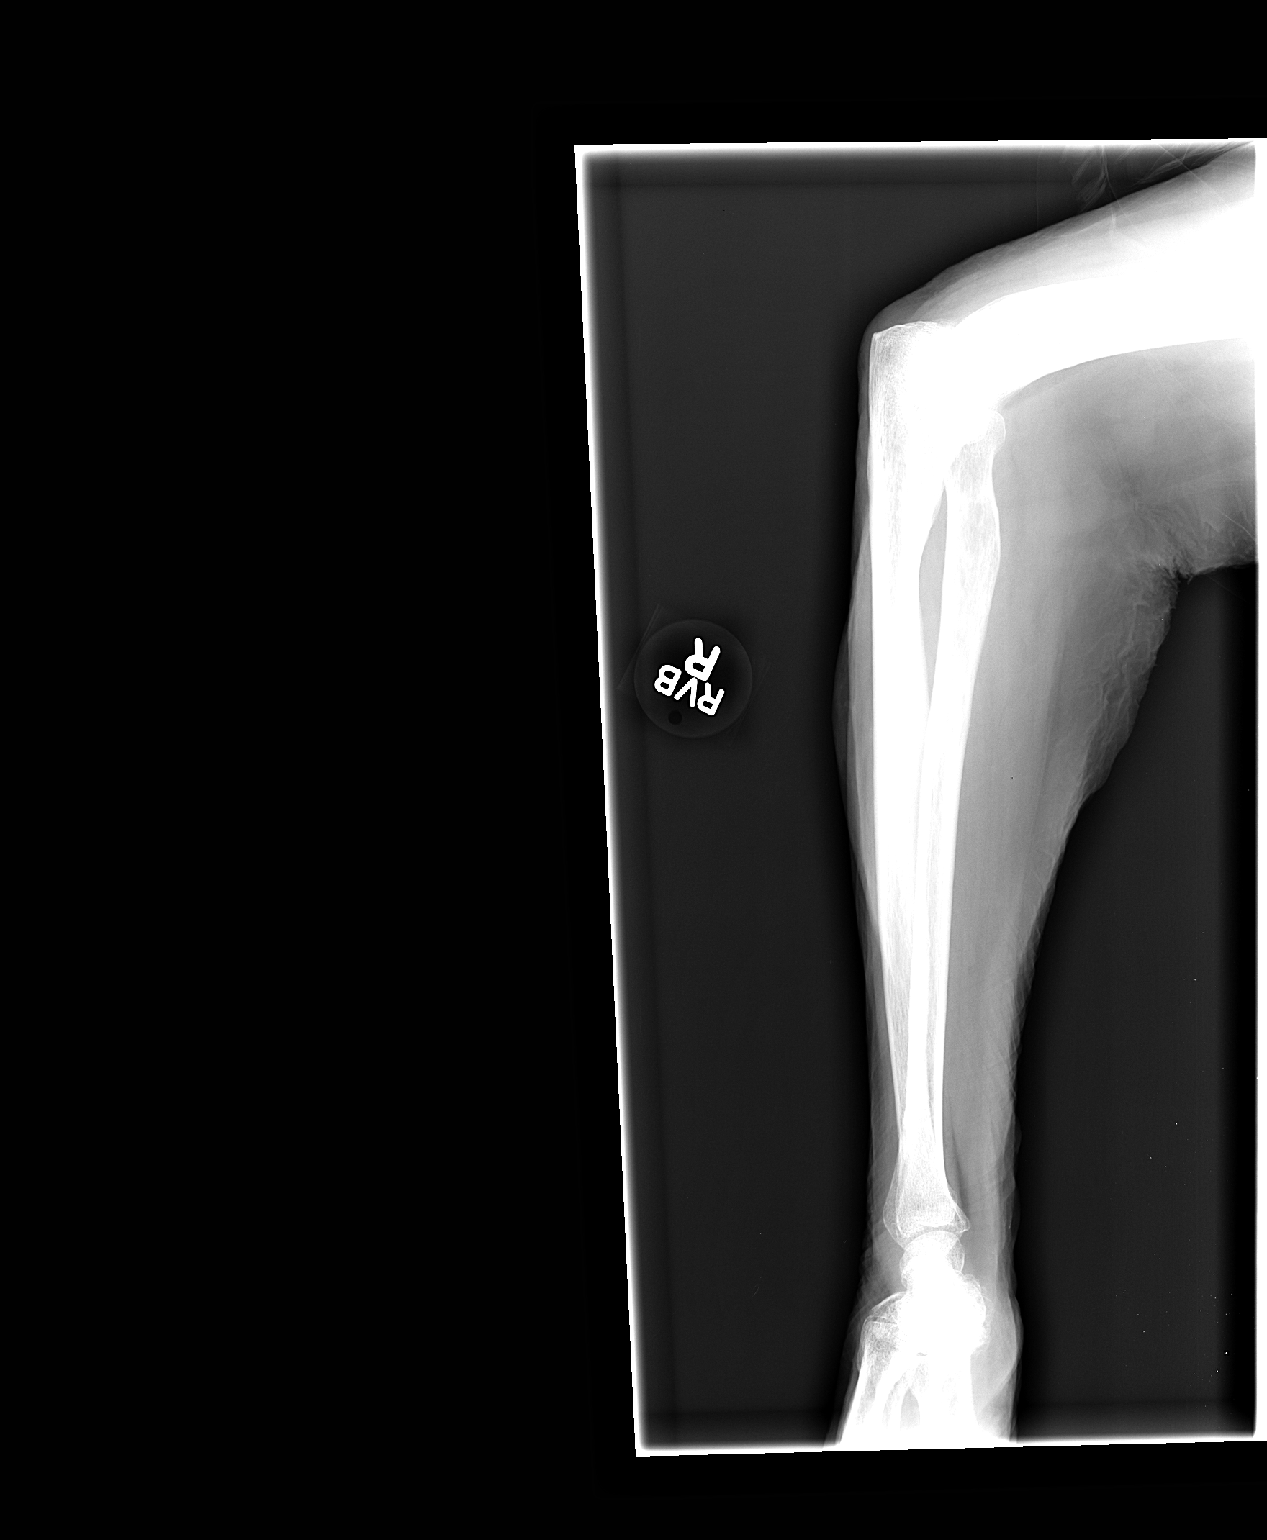

[2 of 2 positions shown; findings below may reference images not displayed]

FINDINGS: The wrist and elbow joints are grossly intact. No acute forearm
fracture is identified.
IMPRESSION: No acute bony findings.

## 2015-02-25 DIAGNOSIS — E039 Hypothyroidism, unspecified: Secondary | ICD-10-CM | POA: Diagnosis not present

## 2015-02-25 DIAGNOSIS — E785 Hyperlipidemia, unspecified: Secondary | ICD-10-CM | POA: Diagnosis not present

## 2015-02-26 DIAGNOSIS — E039 Hypothyroidism, unspecified: Secondary | ICD-10-CM | POA: Diagnosis not present

## 2015-02-26 DIAGNOSIS — E782 Mixed hyperlipidemia: Secondary | ICD-10-CM | POA: Diagnosis not present

## 2015-03-17 DIAGNOSIS — R079 Chest pain, unspecified: Secondary | ICD-10-CM | POA: Diagnosis not present

## 2015-03-17 DIAGNOSIS — R2681 Unsteadiness on feet: Secondary | ICD-10-CM | POA: Diagnosis not present

## 2015-03-17 DIAGNOSIS — R262 Difficulty in walking, not elsewhere classified: Secondary | ICD-10-CM | POA: Diagnosis not present

## 2015-03-17 DIAGNOSIS — M6281 Muscle weakness (generalized): Secondary | ICD-10-CM | POA: Diagnosis not present

## 2015-03-18 DIAGNOSIS — R2681 Unsteadiness on feet: Secondary | ICD-10-CM | POA: Diagnosis not present

## 2015-03-18 DIAGNOSIS — R262 Difficulty in walking, not elsewhere classified: Secondary | ICD-10-CM | POA: Diagnosis not present

## 2015-03-18 DIAGNOSIS — R079 Chest pain, unspecified: Secondary | ICD-10-CM | POA: Diagnosis not present

## 2015-03-18 DIAGNOSIS — M6281 Muscle weakness (generalized): Secondary | ICD-10-CM | POA: Diagnosis not present

## 2015-03-19 DIAGNOSIS — R079 Chest pain, unspecified: Secondary | ICD-10-CM | POA: Diagnosis not present

## 2015-03-19 DIAGNOSIS — R2681 Unsteadiness on feet: Secondary | ICD-10-CM | POA: Diagnosis not present

## 2015-03-19 DIAGNOSIS — R262 Difficulty in walking, not elsewhere classified: Secondary | ICD-10-CM | POA: Diagnosis not present

## 2015-03-19 DIAGNOSIS — M6281 Muscle weakness (generalized): Secondary | ICD-10-CM | POA: Diagnosis not present

## 2015-03-20 DIAGNOSIS — M6281 Muscle weakness (generalized): Secondary | ICD-10-CM | POA: Diagnosis not present

## 2015-03-20 DIAGNOSIS — R262 Difficulty in walking, not elsewhere classified: Secondary | ICD-10-CM | POA: Diagnosis not present

## 2015-03-20 DIAGNOSIS — R079 Chest pain, unspecified: Secondary | ICD-10-CM | POA: Diagnosis not present

## 2015-03-20 DIAGNOSIS — R2681 Unsteadiness on feet: Secondary | ICD-10-CM | POA: Diagnosis not present

## 2015-03-22 DIAGNOSIS — E119 Type 2 diabetes mellitus without complications: Secondary | ICD-10-CM | POA: Diagnosis not present

## 2015-03-22 DIAGNOSIS — R079 Chest pain, unspecified: Secondary | ICD-10-CM | POA: Diagnosis not present

## 2015-03-22 DIAGNOSIS — R2681 Unsteadiness on feet: Secondary | ICD-10-CM | POA: Diagnosis not present

## 2015-03-22 DIAGNOSIS — R262 Difficulty in walking, not elsewhere classified: Secondary | ICD-10-CM | POA: Diagnosis not present

## 2015-03-22 DIAGNOSIS — M6281 Muscle weakness (generalized): Secondary | ICD-10-CM | POA: Diagnosis not present

## 2015-03-22 DIAGNOSIS — I2510 Atherosclerotic heart disease of native coronary artery without angina pectoris: Secondary | ICD-10-CM | POA: Diagnosis not present

## 2015-03-22 DIAGNOSIS — E11649 Type 2 diabetes mellitus with hypoglycemia without coma: Secondary | ICD-10-CM | POA: Diagnosis not present

## 2015-03-22 DIAGNOSIS — I10 Essential (primary) hypertension: Secondary | ICD-10-CM | POA: Diagnosis not present

## 2015-03-24 ENCOUNTER — Other Ambulatory Visit (HOSPITAL_COMMUNITY): Payer: Self-pay | Admitting: Internal Medicine

## 2015-03-24 DIAGNOSIS — D649 Anemia, unspecified: Secondary | ICD-10-CM | POA: Diagnosis not present

## 2015-03-24 DIAGNOSIS — I639 Cerebral infarction, unspecified: Secondary | ICD-10-CM

## 2015-03-24 DIAGNOSIS — R079 Chest pain, unspecified: Secondary | ICD-10-CM | POA: Diagnosis not present

## 2015-03-24 DIAGNOSIS — R400 Somnolence: Secondary | ICD-10-CM | POA: Diagnosis not present

## 2015-03-24 DIAGNOSIS — E089 Diabetes mellitus due to underlying condition without complications: Secondary | ICD-10-CM | POA: Diagnosis not present

## 2015-03-24 DIAGNOSIS — R319 Hematuria, unspecified: Secondary | ICD-10-CM | POA: Diagnosis not present

## 2015-03-24 DIAGNOSIS — Z79899 Other long term (current) drug therapy: Secondary | ICD-10-CM | POA: Diagnosis not present

## 2015-03-24 DIAGNOSIS — M6281 Muscle weakness (generalized): Secondary | ICD-10-CM | POA: Diagnosis not present

## 2015-03-24 DIAGNOSIS — R2681 Unsteadiness on feet: Secondary | ICD-10-CM | POA: Diagnosis not present

## 2015-03-24 DIAGNOSIS — R262 Difficulty in walking, not elsewhere classified: Secondary | ICD-10-CM | POA: Diagnosis not present

## 2015-03-24 DIAGNOSIS — E039 Hypothyroidism, unspecified: Secondary | ICD-10-CM | POA: Diagnosis not present

## 2015-03-25 ENCOUNTER — Ambulatory Visit (HOSPITAL_COMMUNITY): Payer: Medicare Other

## 2015-03-25 DIAGNOSIS — R262 Difficulty in walking, not elsewhere classified: Secondary | ICD-10-CM | POA: Diagnosis not present

## 2015-03-25 DIAGNOSIS — R400 Somnolence: Secondary | ICD-10-CM | POA: Diagnosis not present

## 2015-03-25 DIAGNOSIS — M6281 Muscle weakness (generalized): Secondary | ICD-10-CM | POA: Diagnosis not present

## 2015-03-25 DIAGNOSIS — E11649 Type 2 diabetes mellitus with hypoglycemia without coma: Secondary | ICD-10-CM | POA: Diagnosis not present

## 2015-03-25 DIAGNOSIS — E039 Hypothyroidism, unspecified: Secondary | ICD-10-CM | POA: Diagnosis not present

## 2015-03-25 DIAGNOSIS — R2681 Unsteadiness on feet: Secondary | ICD-10-CM | POA: Diagnosis not present

## 2015-03-25 DIAGNOSIS — R079 Chest pain, unspecified: Secondary | ICD-10-CM | POA: Diagnosis not present

## 2015-03-26 DIAGNOSIS — R079 Chest pain, unspecified: Secondary | ICD-10-CM | POA: Diagnosis not present

## 2015-03-26 DIAGNOSIS — R262 Difficulty in walking, not elsewhere classified: Secondary | ICD-10-CM | POA: Diagnosis not present

## 2015-03-26 DIAGNOSIS — R2681 Unsteadiness on feet: Secondary | ICD-10-CM | POA: Diagnosis not present

## 2015-03-26 DIAGNOSIS — M6281 Muscle weakness (generalized): Secondary | ICD-10-CM | POA: Diagnosis not present

## 2015-03-27 ENCOUNTER — Other Ambulatory Visit (HOSPITAL_COMMUNITY): Payer: Medicare Other

## 2015-03-27 DIAGNOSIS — R2681 Unsteadiness on feet: Secondary | ICD-10-CM | POA: Diagnosis not present

## 2015-03-27 DIAGNOSIS — R079 Chest pain, unspecified: Secondary | ICD-10-CM | POA: Diagnosis not present

## 2015-03-27 DIAGNOSIS — R262 Difficulty in walking, not elsewhere classified: Secondary | ICD-10-CM | POA: Diagnosis not present

## 2015-03-27 DIAGNOSIS — M6281 Muscle weakness (generalized): Secondary | ICD-10-CM | POA: Diagnosis not present

## 2015-03-28 DIAGNOSIS — R2681 Unsteadiness on feet: Secondary | ICD-10-CM | POA: Diagnosis not present

## 2015-03-28 DIAGNOSIS — R262 Difficulty in walking, not elsewhere classified: Secondary | ICD-10-CM | POA: Diagnosis not present

## 2015-03-28 DIAGNOSIS — R079 Chest pain, unspecified: Secondary | ICD-10-CM | POA: Diagnosis not present

## 2015-03-28 DIAGNOSIS — M6281 Muscle weakness (generalized): Secondary | ICD-10-CM | POA: Diagnosis not present

## 2015-03-31 DIAGNOSIS — R079 Chest pain, unspecified: Secondary | ICD-10-CM | POA: Diagnosis not present

## 2015-03-31 DIAGNOSIS — R262 Difficulty in walking, not elsewhere classified: Secondary | ICD-10-CM | POA: Diagnosis not present

## 2015-03-31 DIAGNOSIS — R2681 Unsteadiness on feet: Secondary | ICD-10-CM | POA: Diagnosis not present

## 2015-03-31 DIAGNOSIS — M6281 Muscle weakness (generalized): Secondary | ICD-10-CM | POA: Diagnosis not present

## 2015-04-01 DIAGNOSIS — R262 Difficulty in walking, not elsewhere classified: Secondary | ICD-10-CM | POA: Diagnosis not present

## 2015-04-01 DIAGNOSIS — R2681 Unsteadiness on feet: Secondary | ICD-10-CM | POA: Diagnosis not present

## 2015-04-01 DIAGNOSIS — M6281 Muscle weakness (generalized): Secondary | ICD-10-CM | POA: Diagnosis not present

## 2015-04-01 DIAGNOSIS — R079 Chest pain, unspecified: Secondary | ICD-10-CM | POA: Diagnosis not present

## 2015-04-02 DIAGNOSIS — M6281 Muscle weakness (generalized): Secondary | ICD-10-CM | POA: Diagnosis not present

## 2015-04-02 DIAGNOSIS — R079 Chest pain, unspecified: Secondary | ICD-10-CM | POA: Diagnosis not present

## 2015-04-02 DIAGNOSIS — R2681 Unsteadiness on feet: Secondary | ICD-10-CM | POA: Diagnosis not present

## 2015-04-02 DIAGNOSIS — R262 Difficulty in walking, not elsewhere classified: Secondary | ICD-10-CM | POA: Diagnosis not present

## 2015-04-03 DIAGNOSIS — M6281 Muscle weakness (generalized): Secondary | ICD-10-CM | POA: Diagnosis not present

## 2015-04-03 DIAGNOSIS — M79675 Pain in left toe(s): Secondary | ICD-10-CM | POA: Diagnosis not present

## 2015-04-03 DIAGNOSIS — R079 Chest pain, unspecified: Secondary | ICD-10-CM | POA: Diagnosis not present

## 2015-04-03 DIAGNOSIS — L03032 Cellulitis of left toe: Secondary | ICD-10-CM | POA: Diagnosis not present

## 2015-04-03 DIAGNOSIS — M79672 Pain in left foot: Secondary | ICD-10-CM | POA: Diagnosis not present

## 2015-04-03 DIAGNOSIS — R262 Difficulty in walking, not elsewhere classified: Secondary | ICD-10-CM | POA: Diagnosis not present

## 2015-04-03 DIAGNOSIS — L600 Ingrowing nail: Secondary | ICD-10-CM | POA: Diagnosis not present

## 2015-04-03 DIAGNOSIS — R2681 Unsteadiness on feet: Secondary | ICD-10-CM | POA: Diagnosis not present

## 2015-04-04 DIAGNOSIS — M6281 Muscle weakness (generalized): Secondary | ICD-10-CM | POA: Diagnosis not present

## 2015-04-04 DIAGNOSIS — R2681 Unsteadiness on feet: Secondary | ICD-10-CM | POA: Diagnosis not present

## 2015-04-04 DIAGNOSIS — L03039 Cellulitis of unspecified toe: Secondary | ICD-10-CM | POA: Diagnosis not present

## 2015-04-04 DIAGNOSIS — R262 Difficulty in walking, not elsewhere classified: Secondary | ICD-10-CM | POA: Diagnosis not present

## 2015-04-04 DIAGNOSIS — R079 Chest pain, unspecified: Secondary | ICD-10-CM | POA: Diagnosis not present

## 2015-04-07 DIAGNOSIS — M6281 Muscle weakness (generalized): Secondary | ICD-10-CM | POA: Diagnosis not present

## 2015-04-07 DIAGNOSIS — R262 Difficulty in walking, not elsewhere classified: Secondary | ICD-10-CM | POA: Diagnosis not present

## 2015-04-07 DIAGNOSIS — R079 Chest pain, unspecified: Secondary | ICD-10-CM | POA: Diagnosis not present

## 2015-04-07 DIAGNOSIS — R2681 Unsteadiness on feet: Secondary | ICD-10-CM | POA: Diagnosis not present

## 2015-04-08 DIAGNOSIS — R079 Chest pain, unspecified: Secondary | ICD-10-CM | POA: Diagnosis not present

## 2015-04-08 DIAGNOSIS — R262 Difficulty in walking, not elsewhere classified: Secondary | ICD-10-CM | POA: Diagnosis not present

## 2015-04-08 DIAGNOSIS — R2681 Unsteadiness on feet: Secondary | ICD-10-CM | POA: Diagnosis not present

## 2015-04-08 DIAGNOSIS — M6281 Muscle weakness (generalized): Secondary | ICD-10-CM | POA: Diagnosis not present

## 2015-04-09 DIAGNOSIS — D649 Anemia, unspecified: Secondary | ICD-10-CM | POA: Diagnosis not present

## 2015-04-09 DIAGNOSIS — M6281 Muscle weakness (generalized): Secondary | ICD-10-CM | POA: Diagnosis not present

## 2015-04-09 DIAGNOSIS — R079 Chest pain, unspecified: Secondary | ICD-10-CM | POA: Diagnosis not present

## 2015-04-09 DIAGNOSIS — E039 Hypothyroidism, unspecified: Secondary | ICD-10-CM | POA: Diagnosis not present

## 2015-04-09 DIAGNOSIS — R262 Difficulty in walking, not elsewhere classified: Secondary | ICD-10-CM | POA: Diagnosis not present

## 2015-04-09 DIAGNOSIS — R2681 Unsteadiness on feet: Secondary | ICD-10-CM | POA: Diagnosis not present

## 2015-04-09 DIAGNOSIS — E785 Hyperlipidemia, unspecified: Secondary | ICD-10-CM | POA: Diagnosis not present

## 2015-04-09 DIAGNOSIS — E119 Type 2 diabetes mellitus without complications: Secondary | ICD-10-CM | POA: Diagnosis not present

## 2015-04-09 DIAGNOSIS — E559 Vitamin D deficiency, unspecified: Secondary | ICD-10-CM | POA: Diagnosis not present

## 2015-04-10 DIAGNOSIS — R2681 Unsteadiness on feet: Secondary | ICD-10-CM | POA: Diagnosis not present

## 2015-04-10 DIAGNOSIS — E039 Hypothyroidism, unspecified: Secondary | ICD-10-CM | POA: Diagnosis not present

## 2015-04-10 DIAGNOSIS — E559 Vitamin D deficiency, unspecified: Secondary | ICD-10-CM | POA: Diagnosis not present

## 2015-04-10 DIAGNOSIS — E119 Type 2 diabetes mellitus without complications: Secondary | ICD-10-CM | POA: Diagnosis not present

## 2015-04-10 DIAGNOSIS — M6281 Muscle weakness (generalized): Secondary | ICD-10-CM | POA: Diagnosis not present

## 2015-04-10 DIAGNOSIS — R262 Difficulty in walking, not elsewhere classified: Secondary | ICD-10-CM | POA: Diagnosis not present

## 2015-04-10 DIAGNOSIS — R079 Chest pain, unspecified: Secondary | ICD-10-CM | POA: Diagnosis not present

## 2015-04-11 DIAGNOSIS — M6281 Muscle weakness (generalized): Secondary | ICD-10-CM | POA: Diagnosis not present

## 2015-04-11 DIAGNOSIS — R079 Chest pain, unspecified: Secondary | ICD-10-CM | POA: Diagnosis not present

## 2015-04-11 DIAGNOSIS — R262 Difficulty in walking, not elsewhere classified: Secondary | ICD-10-CM | POA: Diagnosis not present

## 2015-04-11 DIAGNOSIS — R2681 Unsteadiness on feet: Secondary | ICD-10-CM | POA: Diagnosis not present

## 2015-04-14 DIAGNOSIS — R262 Difficulty in walking, not elsewhere classified: Secondary | ICD-10-CM | POA: Diagnosis not present

## 2015-04-14 DIAGNOSIS — R079 Chest pain, unspecified: Secondary | ICD-10-CM | POA: Diagnosis not present

## 2015-04-14 DIAGNOSIS — M6281 Muscle weakness (generalized): Secondary | ICD-10-CM | POA: Diagnosis not present

## 2015-04-14 DIAGNOSIS — R2681 Unsteadiness on feet: Secondary | ICD-10-CM | POA: Diagnosis not present

## 2015-04-15 DIAGNOSIS — M6281 Muscle weakness (generalized): Secondary | ICD-10-CM | POA: Diagnosis not present

## 2015-04-15 DIAGNOSIS — R262 Difficulty in walking, not elsewhere classified: Secondary | ICD-10-CM | POA: Diagnosis not present

## 2015-04-15 DIAGNOSIS — R2681 Unsteadiness on feet: Secondary | ICD-10-CM | POA: Diagnosis not present

## 2015-04-15 DIAGNOSIS — R079 Chest pain, unspecified: Secondary | ICD-10-CM | POA: Diagnosis not present

## 2015-04-16 DIAGNOSIS — L84 Corns and callosities: Secondary | ICD-10-CM | POA: Diagnosis not present

## 2015-04-16 DIAGNOSIS — R079 Chest pain, unspecified: Secondary | ICD-10-CM | POA: Diagnosis not present

## 2015-04-16 DIAGNOSIS — M6281 Muscle weakness (generalized): Secondary | ICD-10-CM | POA: Diagnosis not present

## 2015-04-16 DIAGNOSIS — R2681 Unsteadiness on feet: Secondary | ICD-10-CM | POA: Diagnosis not present

## 2015-04-16 DIAGNOSIS — R262 Difficulty in walking, not elsewhere classified: Secondary | ICD-10-CM | POA: Diagnosis not present

## 2015-04-16 DIAGNOSIS — E1051 Type 1 diabetes mellitus with diabetic peripheral angiopathy without gangrene: Secondary | ICD-10-CM | POA: Diagnosis not present

## 2015-04-17 DIAGNOSIS — M6281 Muscle weakness (generalized): Secondary | ICD-10-CM | POA: Diagnosis not present

## 2015-04-17 DIAGNOSIS — R079 Chest pain, unspecified: Secondary | ICD-10-CM | POA: Diagnosis not present

## 2015-04-17 DIAGNOSIS — L600 Ingrowing nail: Secondary | ICD-10-CM | POA: Diagnosis not present

## 2015-04-17 DIAGNOSIS — M79675 Pain in left toe(s): Secondary | ICD-10-CM | POA: Diagnosis not present

## 2015-04-17 DIAGNOSIS — R262 Difficulty in walking, not elsewhere classified: Secondary | ICD-10-CM | POA: Diagnosis not present

## 2015-04-17 DIAGNOSIS — R2681 Unsteadiness on feet: Secondary | ICD-10-CM | POA: Diagnosis not present

## 2015-04-18 DIAGNOSIS — R2681 Unsteadiness on feet: Secondary | ICD-10-CM | POA: Diagnosis not present

## 2015-04-18 DIAGNOSIS — R079 Chest pain, unspecified: Secondary | ICD-10-CM | POA: Diagnosis not present

## 2015-04-18 DIAGNOSIS — R262 Difficulty in walking, not elsewhere classified: Secondary | ICD-10-CM | POA: Diagnosis not present

## 2015-04-18 DIAGNOSIS — M6281 Muscle weakness (generalized): Secondary | ICD-10-CM | POA: Diagnosis not present

## 2015-04-21 DIAGNOSIS — R079 Chest pain, unspecified: Secondary | ICD-10-CM | POA: Diagnosis not present

## 2015-04-21 DIAGNOSIS — R2681 Unsteadiness on feet: Secondary | ICD-10-CM | POA: Diagnosis not present

## 2015-04-21 DIAGNOSIS — R262 Difficulty in walking, not elsewhere classified: Secondary | ICD-10-CM | POA: Diagnosis not present

## 2015-04-21 DIAGNOSIS — M6281 Muscle weakness (generalized): Secondary | ICD-10-CM | POA: Diagnosis not present

## 2015-04-22 DIAGNOSIS — M6281 Muscle weakness (generalized): Secondary | ICD-10-CM | POA: Diagnosis not present

## 2015-04-22 DIAGNOSIS — R079 Chest pain, unspecified: Secondary | ICD-10-CM | POA: Diagnosis not present

## 2015-04-22 DIAGNOSIS — R2681 Unsteadiness on feet: Secondary | ICD-10-CM | POA: Diagnosis not present

## 2015-04-22 DIAGNOSIS — R262 Difficulty in walking, not elsewhere classified: Secondary | ICD-10-CM | POA: Diagnosis not present

## 2015-04-23 DIAGNOSIS — R262 Difficulty in walking, not elsewhere classified: Secondary | ICD-10-CM | POA: Diagnosis not present

## 2015-04-23 DIAGNOSIS — R079 Chest pain, unspecified: Secondary | ICD-10-CM | POA: Diagnosis not present

## 2015-04-23 DIAGNOSIS — M6281 Muscle weakness (generalized): Secondary | ICD-10-CM | POA: Diagnosis not present

## 2015-04-23 DIAGNOSIS — R2681 Unsteadiness on feet: Secondary | ICD-10-CM | POA: Diagnosis not present

## 2015-04-24 DIAGNOSIS — R2681 Unsteadiness on feet: Secondary | ICD-10-CM | POA: Diagnosis not present

## 2015-04-24 DIAGNOSIS — M6281 Muscle weakness (generalized): Secondary | ICD-10-CM | POA: Diagnosis not present

## 2015-04-24 DIAGNOSIS — R262 Difficulty in walking, not elsewhere classified: Secondary | ICD-10-CM | POA: Diagnosis not present

## 2015-04-24 DIAGNOSIS — R079 Chest pain, unspecified: Secondary | ICD-10-CM | POA: Diagnosis not present

## 2015-04-25 DIAGNOSIS — M6281 Muscle weakness (generalized): Secondary | ICD-10-CM | POA: Diagnosis not present

## 2015-04-25 DIAGNOSIS — R2681 Unsteadiness on feet: Secondary | ICD-10-CM | POA: Diagnosis not present

## 2015-04-25 DIAGNOSIS — R079 Chest pain, unspecified: Secondary | ICD-10-CM | POA: Diagnosis not present

## 2015-04-25 DIAGNOSIS — R262 Difficulty in walking, not elsewhere classified: Secondary | ICD-10-CM | POA: Diagnosis not present

## 2015-04-28 DIAGNOSIS — R2681 Unsteadiness on feet: Secondary | ICD-10-CM | POA: Diagnosis not present

## 2015-04-28 DIAGNOSIS — R262 Difficulty in walking, not elsewhere classified: Secondary | ICD-10-CM | POA: Diagnosis not present

## 2015-04-28 DIAGNOSIS — R079 Chest pain, unspecified: Secondary | ICD-10-CM | POA: Diagnosis not present

## 2015-04-28 DIAGNOSIS — M6281 Muscle weakness (generalized): Secondary | ICD-10-CM | POA: Diagnosis not present

## 2015-04-29 DIAGNOSIS — R262 Difficulty in walking, not elsewhere classified: Secondary | ICD-10-CM | POA: Diagnosis not present

## 2015-04-29 DIAGNOSIS — R079 Chest pain, unspecified: Secondary | ICD-10-CM | POA: Diagnosis not present

## 2015-04-29 DIAGNOSIS — M6281 Muscle weakness (generalized): Secondary | ICD-10-CM | POA: Diagnosis not present

## 2015-04-29 DIAGNOSIS — R2681 Unsteadiness on feet: Secondary | ICD-10-CM | POA: Diagnosis not present

## 2015-04-30 DIAGNOSIS — M6281 Muscle weakness (generalized): Secondary | ICD-10-CM | POA: Diagnosis not present

## 2015-04-30 DIAGNOSIS — R079 Chest pain, unspecified: Secondary | ICD-10-CM | POA: Diagnosis not present

## 2015-04-30 DIAGNOSIS — R262 Difficulty in walking, not elsewhere classified: Secondary | ICD-10-CM | POA: Diagnosis not present

## 2015-04-30 DIAGNOSIS — R2681 Unsteadiness on feet: Secondary | ICD-10-CM | POA: Diagnosis not present

## 2015-05-01 DIAGNOSIS — R2681 Unsteadiness on feet: Secondary | ICD-10-CM | POA: Diagnosis not present

## 2015-05-01 DIAGNOSIS — R079 Chest pain, unspecified: Secondary | ICD-10-CM | POA: Diagnosis not present

## 2015-05-01 DIAGNOSIS — M6281 Muscle weakness (generalized): Secondary | ICD-10-CM | POA: Diagnosis not present

## 2015-05-01 DIAGNOSIS — R262 Difficulty in walking, not elsewhere classified: Secondary | ICD-10-CM | POA: Diagnosis not present

## 2015-05-02 DIAGNOSIS — R079 Chest pain, unspecified: Secondary | ICD-10-CM | POA: Diagnosis not present

## 2015-05-02 DIAGNOSIS — R262 Difficulty in walking, not elsewhere classified: Secondary | ICD-10-CM | POA: Diagnosis not present

## 2015-05-02 DIAGNOSIS — M6281 Muscle weakness (generalized): Secondary | ICD-10-CM | POA: Diagnosis not present

## 2015-05-02 DIAGNOSIS — R2681 Unsteadiness on feet: Secondary | ICD-10-CM | POA: Diagnosis not present

## 2015-05-05 DIAGNOSIS — R262 Difficulty in walking, not elsewhere classified: Secondary | ICD-10-CM | POA: Diagnosis not present

## 2015-05-05 DIAGNOSIS — M6281 Muscle weakness (generalized): Secondary | ICD-10-CM | POA: Diagnosis not present

## 2015-05-05 DIAGNOSIS — R2681 Unsteadiness on feet: Secondary | ICD-10-CM | POA: Diagnosis not present

## 2015-05-05 DIAGNOSIS — R079 Chest pain, unspecified: Secondary | ICD-10-CM | POA: Diagnosis not present

## 2015-05-07 DIAGNOSIS — H1130 Conjunctival hemorrhage, unspecified eye: Secondary | ICD-10-CM | POA: Diagnosis not present

## 2015-05-09 DIAGNOSIS — H1130 Conjunctival hemorrhage, unspecified eye: Secondary | ICD-10-CM | POA: Diagnosis not present

## 2015-05-12 DIAGNOSIS — E039 Hypothyroidism, unspecified: Secondary | ICD-10-CM | POA: Diagnosis not present

## 2015-05-12 DIAGNOSIS — R4182 Altered mental status, unspecified: Secondary | ICD-10-CM | POA: Diagnosis not present

## 2015-05-12 DIAGNOSIS — R300 Dysuria: Secondary | ICD-10-CM | POA: Diagnosis not present

## 2015-06-25 DIAGNOSIS — B351 Tinea unguium: Secondary | ICD-10-CM | POA: Diagnosis not present

## 2015-06-25 DIAGNOSIS — E1151 Type 2 diabetes mellitus with diabetic peripheral angiopathy without gangrene: Secondary | ICD-10-CM | POA: Diagnosis not present

## 2015-07-01 DIAGNOSIS — R2681 Unsteadiness on feet: Secondary | ICD-10-CM | POA: Diagnosis not present

## 2015-07-01 DIAGNOSIS — R079 Chest pain, unspecified: Secondary | ICD-10-CM | POA: Diagnosis not present

## 2015-07-01 DIAGNOSIS — R262 Difficulty in walking, not elsewhere classified: Secondary | ICD-10-CM | POA: Diagnosis not present

## 2015-07-01 DIAGNOSIS — M6281 Muscle weakness (generalized): Secondary | ICD-10-CM | POA: Diagnosis not present

## 2015-07-03 DIAGNOSIS — M6281 Muscle weakness (generalized): Secondary | ICD-10-CM | POA: Diagnosis not present

## 2015-07-03 DIAGNOSIS — R079 Chest pain, unspecified: Secondary | ICD-10-CM | POA: Diagnosis not present

## 2015-07-03 DIAGNOSIS — R2681 Unsteadiness on feet: Secondary | ICD-10-CM | POA: Diagnosis not present

## 2015-07-03 DIAGNOSIS — R262 Difficulty in walking, not elsewhere classified: Secondary | ICD-10-CM | POA: Diagnosis not present

## 2015-07-04 DIAGNOSIS — M6281 Muscle weakness (generalized): Secondary | ICD-10-CM | POA: Diagnosis not present

## 2015-07-04 DIAGNOSIS — R079 Chest pain, unspecified: Secondary | ICD-10-CM | POA: Diagnosis not present

## 2015-07-04 DIAGNOSIS — R2681 Unsteadiness on feet: Secondary | ICD-10-CM | POA: Diagnosis not present

## 2015-07-04 DIAGNOSIS — R262 Difficulty in walking, not elsewhere classified: Secondary | ICD-10-CM | POA: Diagnosis not present

## 2015-07-05 DIAGNOSIS — R2681 Unsteadiness on feet: Secondary | ICD-10-CM | POA: Diagnosis not present

## 2015-07-05 DIAGNOSIS — R262 Difficulty in walking, not elsewhere classified: Secondary | ICD-10-CM | POA: Diagnosis not present

## 2015-07-05 DIAGNOSIS — M6281 Muscle weakness (generalized): Secondary | ICD-10-CM | POA: Diagnosis not present

## 2015-07-05 DIAGNOSIS — R079 Chest pain, unspecified: Secondary | ICD-10-CM | POA: Diagnosis not present

## 2015-07-07 DIAGNOSIS — R079 Chest pain, unspecified: Secondary | ICD-10-CM | POA: Diagnosis not present

## 2015-07-07 DIAGNOSIS — R262 Difficulty in walking, not elsewhere classified: Secondary | ICD-10-CM | POA: Diagnosis not present

## 2015-07-07 DIAGNOSIS — R2681 Unsteadiness on feet: Secondary | ICD-10-CM | POA: Diagnosis not present

## 2015-07-07 DIAGNOSIS — M6281 Muscle weakness (generalized): Secondary | ICD-10-CM | POA: Diagnosis not present

## 2015-07-08 DIAGNOSIS — R2681 Unsteadiness on feet: Secondary | ICD-10-CM | POA: Diagnosis not present

## 2015-07-08 DIAGNOSIS — M6281 Muscle weakness (generalized): Secondary | ICD-10-CM | POA: Diagnosis not present

## 2015-07-08 DIAGNOSIS — R079 Chest pain, unspecified: Secondary | ICD-10-CM | POA: Diagnosis not present

## 2015-07-08 DIAGNOSIS — R262 Difficulty in walking, not elsewhere classified: Secondary | ICD-10-CM | POA: Diagnosis not present

## 2015-07-09 DIAGNOSIS — R2681 Unsteadiness on feet: Secondary | ICD-10-CM | POA: Diagnosis not present

## 2015-07-09 DIAGNOSIS — M6281 Muscle weakness (generalized): Secondary | ICD-10-CM | POA: Diagnosis not present

## 2015-07-09 DIAGNOSIS — R079 Chest pain, unspecified: Secondary | ICD-10-CM | POA: Diagnosis not present

## 2015-07-09 DIAGNOSIS — R262 Difficulty in walking, not elsewhere classified: Secondary | ICD-10-CM | POA: Diagnosis not present

## 2015-07-10 DIAGNOSIS — R079 Chest pain, unspecified: Secondary | ICD-10-CM | POA: Diagnosis not present

## 2015-07-10 DIAGNOSIS — R2681 Unsteadiness on feet: Secondary | ICD-10-CM | POA: Diagnosis not present

## 2015-07-10 DIAGNOSIS — M6281 Muscle weakness (generalized): Secondary | ICD-10-CM | POA: Diagnosis not present

## 2015-07-10 DIAGNOSIS — E039 Hypothyroidism, unspecified: Secondary | ICD-10-CM | POA: Diagnosis not present

## 2015-07-10 DIAGNOSIS — E785 Hyperlipidemia, unspecified: Secondary | ICD-10-CM | POA: Diagnosis not present

## 2015-07-10 DIAGNOSIS — E119 Type 2 diabetes mellitus without complications: Secondary | ICD-10-CM | POA: Diagnosis not present

## 2015-07-10 DIAGNOSIS — F29 Unspecified psychosis not due to a substance or known physiological condition: Secondary | ICD-10-CM | POA: Diagnosis not present

## 2015-07-10 DIAGNOSIS — I10 Essential (primary) hypertension: Secondary | ICD-10-CM | POA: Diagnosis not present

## 2015-07-10 DIAGNOSIS — D649 Anemia, unspecified: Secondary | ICD-10-CM | POA: Diagnosis not present

## 2015-07-10 DIAGNOSIS — R262 Difficulty in walking, not elsewhere classified: Secondary | ICD-10-CM | POA: Diagnosis not present

## 2015-07-11 DIAGNOSIS — R2681 Unsteadiness on feet: Secondary | ICD-10-CM | POA: Diagnosis not present

## 2015-07-11 DIAGNOSIS — M6281 Muscle weakness (generalized): Secondary | ICD-10-CM | POA: Diagnosis not present

## 2015-07-11 DIAGNOSIS — R079 Chest pain, unspecified: Secondary | ICD-10-CM | POA: Diagnosis not present

## 2015-07-11 DIAGNOSIS — R262 Difficulty in walking, not elsewhere classified: Secondary | ICD-10-CM | POA: Diagnosis not present

## 2015-07-14 DIAGNOSIS — M6281 Muscle weakness (generalized): Secondary | ICD-10-CM | POA: Diagnosis not present

## 2015-07-14 DIAGNOSIS — R2681 Unsteadiness on feet: Secondary | ICD-10-CM | POA: Diagnosis not present

## 2015-07-14 DIAGNOSIS — R4182 Altered mental status, unspecified: Secondary | ICD-10-CM | POA: Diagnosis not present

## 2015-07-14 DIAGNOSIS — R079 Chest pain, unspecified: Secondary | ICD-10-CM | POA: Diagnosis not present

## 2015-07-14 DIAGNOSIS — R262 Difficulty in walking, not elsewhere classified: Secondary | ICD-10-CM | POA: Diagnosis not present

## 2015-07-15 DIAGNOSIS — R079 Chest pain, unspecified: Secondary | ICD-10-CM | POA: Diagnosis not present

## 2015-07-15 DIAGNOSIS — R262 Difficulty in walking, not elsewhere classified: Secondary | ICD-10-CM | POA: Diagnosis not present

## 2015-07-15 DIAGNOSIS — R2681 Unsteadiness on feet: Secondary | ICD-10-CM | POA: Diagnosis not present

## 2015-07-15 DIAGNOSIS — M6281 Muscle weakness (generalized): Secondary | ICD-10-CM | POA: Diagnosis not present

## 2015-07-16 DIAGNOSIS — R262 Difficulty in walking, not elsewhere classified: Secondary | ICD-10-CM | POA: Diagnosis not present

## 2015-07-16 DIAGNOSIS — R633 Feeding difficulties: Secondary | ICD-10-CM | POA: Diagnosis not present

## 2015-07-16 DIAGNOSIS — R293 Abnormal posture: Secondary | ICD-10-CM | POA: Diagnosis not present

## 2015-07-16 DIAGNOSIS — R269 Unspecified abnormalities of gait and mobility: Secondary | ICD-10-CM | POA: Diagnosis not present

## 2015-07-16 DIAGNOSIS — M6281 Muscle weakness (generalized): Secondary | ICD-10-CM | POA: Diagnosis not present

## 2015-07-16 DIAGNOSIS — R2681 Unsteadiness on feet: Secondary | ICD-10-CM | POA: Diagnosis not present

## 2015-07-16 DIAGNOSIS — R279 Unspecified lack of coordination: Secondary | ICD-10-CM | POA: Diagnosis not present

## 2015-07-16 DIAGNOSIS — R079 Chest pain, unspecified: Secondary | ICD-10-CM | POA: Diagnosis not present

## 2015-07-17 DIAGNOSIS — R2681 Unsteadiness on feet: Secondary | ICD-10-CM | POA: Diagnosis not present

## 2015-07-17 DIAGNOSIS — M6281 Muscle weakness (generalized): Secondary | ICD-10-CM | POA: Diagnosis not present

## 2015-07-17 DIAGNOSIS — R633 Feeding difficulties: Secondary | ICD-10-CM | POA: Diagnosis not present

## 2015-07-17 DIAGNOSIS — R279 Unspecified lack of coordination: Secondary | ICD-10-CM | POA: Diagnosis not present

## 2015-07-17 DIAGNOSIS — R293 Abnormal posture: Secondary | ICD-10-CM | POA: Diagnosis not present

## 2015-07-17 DIAGNOSIS — R079 Chest pain, unspecified: Secondary | ICD-10-CM | POA: Diagnosis not present

## 2015-07-18 DIAGNOSIS — M6281 Muscle weakness (generalized): Secondary | ICD-10-CM | POA: Diagnosis not present

## 2015-07-18 DIAGNOSIS — R279 Unspecified lack of coordination: Secondary | ICD-10-CM | POA: Diagnosis not present

## 2015-07-18 DIAGNOSIS — R633 Feeding difficulties: Secondary | ICD-10-CM | POA: Diagnosis not present

## 2015-07-18 DIAGNOSIS — R2681 Unsteadiness on feet: Secondary | ICD-10-CM | POA: Diagnosis not present

## 2015-07-18 DIAGNOSIS — R079 Chest pain, unspecified: Secondary | ICD-10-CM | POA: Diagnosis not present

## 2015-07-18 DIAGNOSIS — R293 Abnormal posture: Secondary | ICD-10-CM | POA: Diagnosis not present

## 2015-07-28 DIAGNOSIS — R633 Feeding difficulties: Secondary | ICD-10-CM | POA: Diagnosis not present

## 2015-07-28 DIAGNOSIS — R293 Abnormal posture: Secondary | ICD-10-CM | POA: Diagnosis not present

## 2015-07-28 DIAGNOSIS — R079 Chest pain, unspecified: Secondary | ICD-10-CM | POA: Diagnosis not present

## 2015-07-28 DIAGNOSIS — R2681 Unsteadiness on feet: Secondary | ICD-10-CM | POA: Diagnosis not present

## 2015-07-28 DIAGNOSIS — R279 Unspecified lack of coordination: Secondary | ICD-10-CM | POA: Diagnosis not present

## 2015-07-28 DIAGNOSIS — M6281 Muscle weakness (generalized): Secondary | ICD-10-CM | POA: Diagnosis not present

## 2015-07-29 DIAGNOSIS — M6281 Muscle weakness (generalized): Secondary | ICD-10-CM | POA: Diagnosis not present

## 2015-07-29 DIAGNOSIS — R279 Unspecified lack of coordination: Secondary | ICD-10-CM | POA: Diagnosis not present

## 2015-07-29 DIAGNOSIS — R293 Abnormal posture: Secondary | ICD-10-CM | POA: Diagnosis not present

## 2015-07-29 DIAGNOSIS — R2681 Unsteadiness on feet: Secondary | ICD-10-CM | POA: Diagnosis not present

## 2015-07-29 DIAGNOSIS — R633 Feeding difficulties: Secondary | ICD-10-CM | POA: Diagnosis not present

## 2015-07-29 DIAGNOSIS — R079 Chest pain, unspecified: Secondary | ICD-10-CM | POA: Diagnosis not present

## 2015-07-31 DIAGNOSIS — M6281 Muscle weakness (generalized): Secondary | ICD-10-CM | POA: Diagnosis not present

## 2015-07-31 DIAGNOSIS — R079 Chest pain, unspecified: Secondary | ICD-10-CM | POA: Diagnosis not present

## 2015-07-31 DIAGNOSIS — R279 Unspecified lack of coordination: Secondary | ICD-10-CM | POA: Diagnosis not present

## 2015-07-31 DIAGNOSIS — R2681 Unsteadiness on feet: Secondary | ICD-10-CM | POA: Diagnosis not present

## 2015-07-31 DIAGNOSIS — R293 Abnormal posture: Secondary | ICD-10-CM | POA: Diagnosis not present

## 2015-07-31 DIAGNOSIS — R633 Feeding difficulties: Secondary | ICD-10-CM | POA: Diagnosis not present

## 2015-08-01 DIAGNOSIS — R293 Abnormal posture: Secondary | ICD-10-CM | POA: Diagnosis not present

## 2015-08-01 DIAGNOSIS — R279 Unspecified lack of coordination: Secondary | ICD-10-CM | POA: Diagnosis not present

## 2015-08-01 DIAGNOSIS — R2681 Unsteadiness on feet: Secondary | ICD-10-CM | POA: Diagnosis not present

## 2015-08-01 DIAGNOSIS — R079 Chest pain, unspecified: Secondary | ICD-10-CM | POA: Diagnosis not present

## 2015-08-01 DIAGNOSIS — M6281 Muscle weakness (generalized): Secondary | ICD-10-CM | POA: Diagnosis not present

## 2015-08-01 DIAGNOSIS — R633 Feeding difficulties: Secondary | ICD-10-CM | POA: Diagnosis not present

## 2015-08-04 DIAGNOSIS — R293 Abnormal posture: Secondary | ICD-10-CM | POA: Diagnosis not present

## 2015-08-04 DIAGNOSIS — R279 Unspecified lack of coordination: Secondary | ICD-10-CM | POA: Diagnosis not present

## 2015-08-04 DIAGNOSIS — M6281 Muscle weakness (generalized): Secondary | ICD-10-CM | POA: Diagnosis not present

## 2015-08-04 DIAGNOSIS — R079 Chest pain, unspecified: Secondary | ICD-10-CM | POA: Diagnosis not present

## 2015-08-04 DIAGNOSIS — R633 Feeding difficulties: Secondary | ICD-10-CM | POA: Diagnosis not present

## 2015-08-04 DIAGNOSIS — R2681 Unsteadiness on feet: Secondary | ICD-10-CM | POA: Diagnosis not present

## 2015-08-05 DIAGNOSIS — R633 Feeding difficulties: Secondary | ICD-10-CM | POA: Diagnosis not present

## 2015-08-05 DIAGNOSIS — R293 Abnormal posture: Secondary | ICD-10-CM | POA: Diagnosis not present

## 2015-08-05 DIAGNOSIS — R279 Unspecified lack of coordination: Secondary | ICD-10-CM | POA: Diagnosis not present

## 2015-08-05 DIAGNOSIS — M6281 Muscle weakness (generalized): Secondary | ICD-10-CM | POA: Diagnosis not present

## 2015-08-05 DIAGNOSIS — R079 Chest pain, unspecified: Secondary | ICD-10-CM | POA: Diagnosis not present

## 2015-08-05 DIAGNOSIS — R2681 Unsteadiness on feet: Secondary | ICD-10-CM | POA: Diagnosis not present

## 2015-08-06 DIAGNOSIS — R079 Chest pain, unspecified: Secondary | ICD-10-CM | POA: Diagnosis not present

## 2015-08-06 DIAGNOSIS — R293 Abnormal posture: Secondary | ICD-10-CM | POA: Diagnosis not present

## 2015-08-06 DIAGNOSIS — R279 Unspecified lack of coordination: Secondary | ICD-10-CM | POA: Diagnosis not present

## 2015-08-06 DIAGNOSIS — R633 Feeding difficulties: Secondary | ICD-10-CM | POA: Diagnosis not present

## 2015-08-06 DIAGNOSIS — M6281 Muscle weakness (generalized): Secondary | ICD-10-CM | POA: Diagnosis not present

## 2015-08-06 DIAGNOSIS — R2681 Unsteadiness on feet: Secondary | ICD-10-CM | POA: Diagnosis not present

## 2015-08-07 DIAGNOSIS — R079 Chest pain, unspecified: Secondary | ICD-10-CM | POA: Diagnosis not present

## 2015-08-07 DIAGNOSIS — M6281 Muscle weakness (generalized): Secondary | ICD-10-CM | POA: Diagnosis not present

## 2015-08-07 DIAGNOSIS — R633 Feeding difficulties: Secondary | ICD-10-CM | POA: Diagnosis not present

## 2015-08-07 DIAGNOSIS — R279 Unspecified lack of coordination: Secondary | ICD-10-CM | POA: Diagnosis not present

## 2015-08-07 DIAGNOSIS — R293 Abnormal posture: Secondary | ICD-10-CM | POA: Diagnosis not present

## 2015-08-07 DIAGNOSIS — R2681 Unsteadiness on feet: Secondary | ICD-10-CM | POA: Diagnosis not present

## 2015-08-08 DIAGNOSIS — F339 Major depressive disorder, recurrent, unspecified: Secondary | ICD-10-CM | POA: Diagnosis not present

## 2015-08-08 DIAGNOSIS — R079 Chest pain, unspecified: Secondary | ICD-10-CM | POA: Diagnosis not present

## 2015-08-08 DIAGNOSIS — F29 Unspecified psychosis not due to a substance or known physiological condition: Secondary | ICD-10-CM | POA: Diagnosis not present

## 2015-08-08 DIAGNOSIS — R2681 Unsteadiness on feet: Secondary | ICD-10-CM | POA: Diagnosis not present

## 2015-08-08 DIAGNOSIS — F0391 Unspecified dementia with behavioral disturbance: Secondary | ICD-10-CM | POA: Diagnosis not present

## 2015-08-08 DIAGNOSIS — R279 Unspecified lack of coordination: Secondary | ICD-10-CM | POA: Diagnosis not present

## 2015-08-08 DIAGNOSIS — R633 Feeding difficulties: Secondary | ICD-10-CM | POA: Diagnosis not present

## 2015-08-08 DIAGNOSIS — M6281 Muscle weakness (generalized): Secondary | ICD-10-CM | POA: Diagnosis not present

## 2015-08-08 DIAGNOSIS — R293 Abnormal posture: Secondary | ICD-10-CM | POA: Diagnosis not present

## 2015-08-11 DIAGNOSIS — R2681 Unsteadiness on feet: Secondary | ICD-10-CM | POA: Diagnosis not present

## 2015-08-11 DIAGNOSIS — R633 Feeding difficulties: Secondary | ICD-10-CM | POA: Diagnosis not present

## 2015-08-11 DIAGNOSIS — M6281 Muscle weakness (generalized): Secondary | ICD-10-CM | POA: Diagnosis not present

## 2015-08-11 DIAGNOSIS — R279 Unspecified lack of coordination: Secondary | ICD-10-CM | POA: Diagnosis not present

## 2015-08-11 DIAGNOSIS — R293 Abnormal posture: Secondary | ICD-10-CM | POA: Diagnosis not present

## 2015-08-11 DIAGNOSIS — R079 Chest pain, unspecified: Secondary | ICD-10-CM | POA: Diagnosis not present

## 2015-08-12 DIAGNOSIS — R293 Abnormal posture: Secondary | ICD-10-CM | POA: Diagnosis not present

## 2015-08-12 DIAGNOSIS — M6281 Muscle weakness (generalized): Secondary | ICD-10-CM | POA: Diagnosis not present

## 2015-08-12 DIAGNOSIS — R079 Chest pain, unspecified: Secondary | ICD-10-CM | POA: Diagnosis not present

## 2015-08-12 DIAGNOSIS — R279 Unspecified lack of coordination: Secondary | ICD-10-CM | POA: Diagnosis not present

## 2015-08-12 DIAGNOSIS — R2681 Unsteadiness on feet: Secondary | ICD-10-CM | POA: Diagnosis not present

## 2015-08-12 DIAGNOSIS — R633 Feeding difficulties: Secondary | ICD-10-CM | POA: Diagnosis not present

## 2015-08-13 DIAGNOSIS — M6281 Muscle weakness (generalized): Secondary | ICD-10-CM | POA: Diagnosis not present

## 2015-08-13 DIAGNOSIS — R079 Chest pain, unspecified: Secondary | ICD-10-CM | POA: Diagnosis not present

## 2015-08-14 DIAGNOSIS — R079 Chest pain, unspecified: Secondary | ICD-10-CM | POA: Diagnosis not present

## 2015-08-14 DIAGNOSIS — M6281 Muscle weakness (generalized): Secondary | ICD-10-CM | POA: Diagnosis not present

## 2015-08-15 DIAGNOSIS — R079 Chest pain, unspecified: Secondary | ICD-10-CM | POA: Diagnosis not present

## 2015-08-15 DIAGNOSIS — M6281 Muscle weakness (generalized): Secondary | ICD-10-CM | POA: Diagnosis not present

## 2015-08-18 DIAGNOSIS — M6281 Muscle weakness (generalized): Secondary | ICD-10-CM | POA: Diagnosis not present

## 2015-08-18 DIAGNOSIS — R079 Chest pain, unspecified: Secondary | ICD-10-CM | POA: Diagnosis not present

## 2015-08-19 DIAGNOSIS — R079 Chest pain, unspecified: Secondary | ICD-10-CM | POA: Diagnosis not present

## 2015-08-19 DIAGNOSIS — M6281 Muscle weakness (generalized): Secondary | ICD-10-CM | POA: Diagnosis not present

## 2015-08-20 DIAGNOSIS — R079 Chest pain, unspecified: Secondary | ICD-10-CM | POA: Diagnosis not present

## 2015-08-20 DIAGNOSIS — M6281 Muscle weakness (generalized): Secondary | ICD-10-CM | POA: Diagnosis not present

## 2015-08-21 DIAGNOSIS — M6281 Muscle weakness (generalized): Secondary | ICD-10-CM | POA: Diagnosis not present

## 2015-08-21 DIAGNOSIS — R079 Chest pain, unspecified: Secondary | ICD-10-CM | POA: Diagnosis not present

## 2015-08-22 DIAGNOSIS — R079 Chest pain, unspecified: Secondary | ICD-10-CM | POA: Diagnosis not present

## 2015-08-22 DIAGNOSIS — M6281 Muscle weakness (generalized): Secondary | ICD-10-CM | POA: Diagnosis not present

## 2015-08-25 DIAGNOSIS — R079 Chest pain, unspecified: Secondary | ICD-10-CM | POA: Diagnosis not present

## 2015-08-25 DIAGNOSIS — M6281 Muscle weakness (generalized): Secondary | ICD-10-CM | POA: Diagnosis not present

## 2015-09-09 DIAGNOSIS — E1140 Type 2 diabetes mellitus with diabetic neuropathy, unspecified: Secondary | ICD-10-CM | POA: Diagnosis not present

## 2015-09-09 DIAGNOSIS — E1151 Type 2 diabetes mellitus with diabetic peripheral angiopathy without gangrene: Secondary | ICD-10-CM | POA: Diagnosis not present

## 2015-09-18 DIAGNOSIS — L509 Urticaria, unspecified: Secondary | ICD-10-CM | POA: Diagnosis not present

## 2015-09-18 DIAGNOSIS — F068 Other specified mental disorders due to known physiological condition: Secondary | ICD-10-CM | POA: Diagnosis not present

## 2015-09-18 DIAGNOSIS — E119 Type 2 diabetes mellitus without complications: Secondary | ICD-10-CM | POA: Diagnosis not present

## 2015-09-21 DIAGNOSIS — F339 Major depressive disorder, recurrent, unspecified: Secondary | ICD-10-CM | POA: Diagnosis not present

## 2015-09-21 DIAGNOSIS — F29 Unspecified psychosis not due to a substance or known physiological condition: Secondary | ICD-10-CM | POA: Diagnosis not present

## 2015-09-21 DIAGNOSIS — F0391 Unspecified dementia with behavioral disturbance: Secondary | ICD-10-CM | POA: Diagnosis not present

## 2015-10-08 DIAGNOSIS — E785 Hyperlipidemia, unspecified: Secondary | ICD-10-CM | POA: Diagnosis not present

## 2015-10-08 DIAGNOSIS — R4182 Altered mental status, unspecified: Secondary | ICD-10-CM | POA: Diagnosis not present

## 2015-10-08 DIAGNOSIS — E559 Vitamin D deficiency, unspecified: Secondary | ICD-10-CM | POA: Diagnosis not present

## 2015-10-08 DIAGNOSIS — E119 Type 2 diabetes mellitus without complications: Secondary | ICD-10-CM | POA: Diagnosis not present

## 2015-10-08 DIAGNOSIS — I10 Essential (primary) hypertension: Secondary | ICD-10-CM | POA: Diagnosis not present

## 2015-10-08 DIAGNOSIS — Z79899 Other long term (current) drug therapy: Secondary | ICD-10-CM | POA: Diagnosis not present

## 2015-11-22 DIAGNOSIS — F29 Unspecified psychosis not due to a substance or known physiological condition: Secondary | ICD-10-CM | POA: Diagnosis not present

## 2015-11-22 DIAGNOSIS — F0391 Unspecified dementia with behavioral disturbance: Secondary | ICD-10-CM | POA: Diagnosis not present

## 2015-11-22 DIAGNOSIS — F339 Major depressive disorder, recurrent, unspecified: Secondary | ICD-10-CM | POA: Diagnosis not present

## 2015-11-25 DIAGNOSIS — E1151 Type 2 diabetes mellitus with diabetic peripheral angiopathy without gangrene: Secondary | ICD-10-CM | POA: Diagnosis not present

## 2015-11-25 DIAGNOSIS — E1140 Type 2 diabetes mellitus with diabetic neuropathy, unspecified: Secondary | ICD-10-CM | POA: Diagnosis not present

## 2015-12-15 DIAGNOSIS — R21 Rash and other nonspecific skin eruption: Secondary | ICD-10-CM | POA: Diagnosis not present

## 2015-12-15 DIAGNOSIS — R400 Somnolence: Secondary | ICD-10-CM | POA: Diagnosis not present

## 2015-12-15 DIAGNOSIS — Z79899 Other long term (current) drug therapy: Secondary | ICD-10-CM | POA: Diagnosis not present

## 2015-12-15 DIAGNOSIS — D649 Anemia, unspecified: Secondary | ICD-10-CM | POA: Diagnosis not present

## 2015-12-15 DIAGNOSIS — R319 Hematuria, unspecified: Secondary | ICD-10-CM | POA: Diagnosis not present

## 2015-12-15 DIAGNOSIS — R4182 Altered mental status, unspecified: Secondary | ICD-10-CM | POA: Diagnosis not present

## 2015-12-15 DIAGNOSIS — N390 Urinary tract infection, site not specified: Secondary | ICD-10-CM | POA: Diagnosis not present

## 2016-01-02 DIAGNOSIS — F0391 Unspecified dementia with behavioral disturbance: Secondary | ICD-10-CM | POA: Diagnosis not present

## 2016-01-02 DIAGNOSIS — F339 Major depressive disorder, recurrent, unspecified: Secondary | ICD-10-CM | POA: Diagnosis not present

## 2016-01-02 DIAGNOSIS — F29 Unspecified psychosis not due to a substance or known physiological condition: Secondary | ICD-10-CM | POA: Diagnosis not present

## 2016-01-07 DIAGNOSIS — D649 Anemia, unspecified: Secondary | ICD-10-CM | POA: Diagnosis not present

## 2016-01-07 DIAGNOSIS — I10 Essential (primary) hypertension: Secondary | ICD-10-CM | POA: Diagnosis not present

## 2016-01-07 DIAGNOSIS — E039 Hypothyroidism, unspecified: Secondary | ICD-10-CM | POA: Diagnosis not present

## 2016-01-07 DIAGNOSIS — E119 Type 2 diabetes mellitus without complications: Secondary | ICD-10-CM | POA: Diagnosis not present

## 2016-01-27 DIAGNOSIS — M25511 Pain in right shoulder: Secondary | ICD-10-CM | POA: Diagnosis not present

## 2016-01-27 DIAGNOSIS — M6281 Muscle weakness (generalized): Secondary | ICD-10-CM | POA: Diagnosis not present

## 2016-01-27 DIAGNOSIS — E119 Type 2 diabetes mellitus without complications: Secondary | ICD-10-CM | POA: Diagnosis not present

## 2016-01-27 DIAGNOSIS — M1990 Unspecified osteoarthritis, unspecified site: Secondary | ICD-10-CM | POA: Diagnosis not present

## 2016-01-28 DIAGNOSIS — M6281 Muscle weakness (generalized): Secondary | ICD-10-CM | POA: Diagnosis not present

## 2016-01-28 DIAGNOSIS — M25511 Pain in right shoulder: Secondary | ICD-10-CM | POA: Diagnosis not present

## 2016-01-28 DIAGNOSIS — E119 Type 2 diabetes mellitus without complications: Secondary | ICD-10-CM | POA: Diagnosis not present

## 2016-01-28 DIAGNOSIS — M1990 Unspecified osteoarthritis, unspecified site: Secondary | ICD-10-CM | POA: Diagnosis not present

## 2016-01-30 DIAGNOSIS — E119 Type 2 diabetes mellitus without complications: Secondary | ICD-10-CM | POA: Diagnosis not present

## 2016-01-30 DIAGNOSIS — M6281 Muscle weakness (generalized): Secondary | ICD-10-CM | POA: Diagnosis not present

## 2016-01-30 DIAGNOSIS — M1990 Unspecified osteoarthritis, unspecified site: Secondary | ICD-10-CM | POA: Diagnosis not present

## 2016-01-30 DIAGNOSIS — M25511 Pain in right shoulder: Secondary | ICD-10-CM | POA: Diagnosis not present

## 2016-02-02 DIAGNOSIS — M25511 Pain in right shoulder: Secondary | ICD-10-CM | POA: Diagnosis not present

## 2016-02-02 DIAGNOSIS — E119 Type 2 diabetes mellitus without complications: Secondary | ICD-10-CM | POA: Diagnosis not present

## 2016-02-02 DIAGNOSIS — M6281 Muscle weakness (generalized): Secondary | ICD-10-CM | POA: Diagnosis not present

## 2016-02-02 DIAGNOSIS — M1990 Unspecified osteoarthritis, unspecified site: Secondary | ICD-10-CM | POA: Diagnosis not present

## 2016-02-03 DIAGNOSIS — M1990 Unspecified osteoarthritis, unspecified site: Secondary | ICD-10-CM | POA: Diagnosis not present

## 2016-02-03 DIAGNOSIS — M25511 Pain in right shoulder: Secondary | ICD-10-CM | POA: Diagnosis not present

## 2016-02-03 DIAGNOSIS — M6281 Muscle weakness (generalized): Secondary | ICD-10-CM | POA: Diagnosis not present

## 2016-02-03 DIAGNOSIS — E119 Type 2 diabetes mellitus without complications: Secondary | ICD-10-CM | POA: Diagnosis not present

## 2016-02-04 DIAGNOSIS — M6281 Muscle weakness (generalized): Secondary | ICD-10-CM | POA: Diagnosis not present

## 2016-02-04 DIAGNOSIS — M1990 Unspecified osteoarthritis, unspecified site: Secondary | ICD-10-CM | POA: Diagnosis not present

## 2016-02-04 DIAGNOSIS — M25511 Pain in right shoulder: Secondary | ICD-10-CM | POA: Diagnosis not present

## 2016-02-04 DIAGNOSIS — E119 Type 2 diabetes mellitus without complications: Secondary | ICD-10-CM | POA: Diagnosis not present

## 2016-02-05 DIAGNOSIS — M1990 Unspecified osteoarthritis, unspecified site: Secondary | ICD-10-CM | POA: Diagnosis not present

## 2016-02-05 DIAGNOSIS — E119 Type 2 diabetes mellitus without complications: Secondary | ICD-10-CM | POA: Diagnosis not present

## 2016-02-05 DIAGNOSIS — M6281 Muscle weakness (generalized): Secondary | ICD-10-CM | POA: Diagnosis not present

## 2016-02-05 DIAGNOSIS — M25511 Pain in right shoulder: Secondary | ICD-10-CM | POA: Diagnosis not present

## 2016-02-06 DIAGNOSIS — M6281 Muscle weakness (generalized): Secondary | ICD-10-CM | POA: Diagnosis not present

## 2016-02-06 DIAGNOSIS — M25511 Pain in right shoulder: Secondary | ICD-10-CM | POA: Diagnosis not present

## 2016-02-06 DIAGNOSIS — E119 Type 2 diabetes mellitus without complications: Secondary | ICD-10-CM | POA: Diagnosis not present

## 2016-02-06 DIAGNOSIS — M1990 Unspecified osteoarthritis, unspecified site: Secondary | ICD-10-CM | POA: Diagnosis not present

## 2016-02-09 DIAGNOSIS — R627 Adult failure to thrive: Secondary | ICD-10-CM | POA: Diagnosis not present

## 2016-02-09 DIAGNOSIS — M25511 Pain in right shoulder: Secondary | ICD-10-CM | POA: Diagnosis not present

## 2016-02-09 DIAGNOSIS — E119 Type 2 diabetes mellitus without complications: Secondary | ICD-10-CM | POA: Diagnosis not present

## 2016-02-09 DIAGNOSIS — M1990 Unspecified osteoarthritis, unspecified site: Secondary | ICD-10-CM | POA: Diagnosis not present

## 2016-02-09 DIAGNOSIS — M6281 Muscle weakness (generalized): Secondary | ICD-10-CM | POA: Diagnosis not present

## 2016-02-09 DIAGNOSIS — F068 Other specified mental disorders due to known physiological condition: Secondary | ICD-10-CM | POA: Diagnosis not present

## 2016-02-09 DIAGNOSIS — E46 Unspecified protein-calorie malnutrition: Secondary | ICD-10-CM | POA: Diagnosis not present

## 2016-02-10 DIAGNOSIS — M6281 Muscle weakness (generalized): Secondary | ICD-10-CM | POA: Diagnosis not present

## 2016-02-10 DIAGNOSIS — E1151 Type 2 diabetes mellitus with diabetic peripheral angiopathy without gangrene: Secondary | ICD-10-CM | POA: Diagnosis not present

## 2016-02-10 DIAGNOSIS — E119 Type 2 diabetes mellitus without complications: Secondary | ICD-10-CM | POA: Diagnosis not present

## 2016-02-10 DIAGNOSIS — M1990 Unspecified osteoarthritis, unspecified site: Secondary | ICD-10-CM | POA: Diagnosis not present

## 2016-02-10 DIAGNOSIS — M25511 Pain in right shoulder: Secondary | ICD-10-CM | POA: Diagnosis not present

## 2016-02-10 DIAGNOSIS — E1140 Type 2 diabetes mellitus with diabetic neuropathy, unspecified: Secondary | ICD-10-CM | POA: Diagnosis not present

## 2016-02-10 DIAGNOSIS — R4182 Altered mental status, unspecified: Secondary | ICD-10-CM | POA: Diagnosis not present

## 2016-02-11 DIAGNOSIS — E119 Type 2 diabetes mellitus without complications: Secondary | ICD-10-CM | POA: Diagnosis not present

## 2016-02-11 DIAGNOSIS — M1990 Unspecified osteoarthritis, unspecified site: Secondary | ICD-10-CM | POA: Diagnosis not present

## 2016-02-11 DIAGNOSIS — M6281 Muscle weakness (generalized): Secondary | ICD-10-CM | POA: Diagnosis not present

## 2016-02-11 DIAGNOSIS — M25511 Pain in right shoulder: Secondary | ICD-10-CM | POA: Diagnosis not present

## 2016-02-12 DIAGNOSIS — M25511 Pain in right shoulder: Secondary | ICD-10-CM | POA: Diagnosis not present

## 2016-02-12 DIAGNOSIS — E119 Type 2 diabetes mellitus without complications: Secondary | ICD-10-CM | POA: Diagnosis not present

## 2016-02-12 DIAGNOSIS — M1990 Unspecified osteoarthritis, unspecified site: Secondary | ICD-10-CM | POA: Diagnosis not present

## 2016-02-12 DIAGNOSIS — M6281 Muscle weakness (generalized): Secondary | ICD-10-CM | POA: Diagnosis not present

## 2016-02-13 DIAGNOSIS — M1990 Unspecified osteoarthritis, unspecified site: Secondary | ICD-10-CM | POA: Diagnosis not present

## 2016-02-13 DIAGNOSIS — M25511 Pain in right shoulder: Secondary | ICD-10-CM | POA: Diagnosis not present

## 2016-02-13 DIAGNOSIS — R1310 Dysphagia, unspecified: Secondary | ICD-10-CM | POA: Diagnosis not present

## 2016-02-13 DIAGNOSIS — M6281 Muscle weakness (generalized): Secondary | ICD-10-CM | POA: Diagnosis not present

## 2016-02-13 DIAGNOSIS — E119 Type 2 diabetes mellitus without complications: Secondary | ICD-10-CM | POA: Diagnosis not present

## 2016-02-20 DIAGNOSIS — F068 Other specified mental disorders due to known physiological condition: Secondary | ICD-10-CM | POA: Diagnosis not present

## 2016-02-20 DIAGNOSIS — E46 Unspecified protein-calorie malnutrition: Secondary | ICD-10-CM | POA: Diagnosis not present

## 2016-02-20 DIAGNOSIS — H109 Unspecified conjunctivitis: Secondary | ICD-10-CM | POA: Diagnosis not present

## 2016-02-23 DIAGNOSIS — F068 Other specified mental disorders due to known physiological condition: Secondary | ICD-10-CM | POA: Diagnosis not present

## 2016-02-23 DIAGNOSIS — R319 Hematuria, unspecified: Secondary | ICD-10-CM | POA: Diagnosis not present

## 2016-02-23 DIAGNOSIS — R69 Illness, unspecified: Secondary | ICD-10-CM | POA: Diagnosis not present

## 2016-02-23 DIAGNOSIS — N390 Urinary tract infection, site not specified: Secondary | ICD-10-CM | POA: Diagnosis not present

## 2016-02-26 DIAGNOSIS — F068 Other specified mental disorders due to known physiological condition: Secondary | ICD-10-CM | POA: Diagnosis not present

## 2016-02-26 DIAGNOSIS — N390 Urinary tract infection, site not specified: Secondary | ICD-10-CM | POA: Diagnosis not present

## 2016-02-26 DIAGNOSIS — E119 Type 2 diabetes mellitus without complications: Secondary | ICD-10-CM | POA: Diagnosis not present

## 2016-02-26 DIAGNOSIS — R21 Rash and other nonspecific skin eruption: Secondary | ICD-10-CM | POA: Diagnosis not present

## 2016-03-05 DIAGNOSIS — F29 Unspecified psychosis not due to a substance or known physiological condition: Secondary | ICD-10-CM | POA: Diagnosis not present

## 2016-03-05 DIAGNOSIS — F339 Major depressive disorder, recurrent, unspecified: Secondary | ICD-10-CM | POA: Diagnosis not present

## 2016-03-05 DIAGNOSIS — F0391 Unspecified dementia with behavioral disturbance: Secondary | ICD-10-CM | POA: Diagnosis not present

## 2016-03-11 DIAGNOSIS — R627 Adult failure to thrive: Secondary | ICD-10-CM | POA: Diagnosis not present

## 2016-03-11 DIAGNOSIS — E46 Unspecified protein-calorie malnutrition: Secondary | ICD-10-CM | POA: Diagnosis not present

## 2016-03-11 DIAGNOSIS — F068 Other specified mental disorders due to known physiological condition: Secondary | ICD-10-CM | POA: Diagnosis not present

## 2016-03-11 DIAGNOSIS — N390 Urinary tract infection, site not specified: Secondary | ICD-10-CM | POA: Diagnosis not present

## 2016-03-14 DIAGNOSIS — M6281 Muscle weakness (generalized): Secondary | ICD-10-CM | POA: Diagnosis not present

## 2016-03-14 DIAGNOSIS — E119 Type 2 diabetes mellitus without complications: Secondary | ICD-10-CM | POA: Diagnosis not present

## 2016-03-14 DIAGNOSIS — R1310 Dysphagia, unspecified: Secondary | ICD-10-CM | POA: Diagnosis not present

## 2016-03-14 DIAGNOSIS — R41841 Cognitive communication deficit: Secondary | ICD-10-CM | POA: Diagnosis not present

## 2016-03-15 DIAGNOSIS — E119 Type 2 diabetes mellitus without complications: Secondary | ICD-10-CM | POA: Diagnosis not present

## 2016-03-15 DIAGNOSIS — R1310 Dysphagia, unspecified: Secondary | ICD-10-CM | POA: Diagnosis not present

## 2016-03-15 DIAGNOSIS — M6281 Muscle weakness (generalized): Secondary | ICD-10-CM | POA: Diagnosis not present

## 2016-03-15 DIAGNOSIS — R41841 Cognitive communication deficit: Secondary | ICD-10-CM | POA: Diagnosis not present

## 2016-03-16 DIAGNOSIS — R1310 Dysphagia, unspecified: Secondary | ICD-10-CM | POA: Diagnosis not present

## 2016-03-16 DIAGNOSIS — R41841 Cognitive communication deficit: Secondary | ICD-10-CM | POA: Diagnosis not present

## 2016-03-16 DIAGNOSIS — E119 Type 2 diabetes mellitus without complications: Secondary | ICD-10-CM | POA: Diagnosis not present

## 2016-03-16 DIAGNOSIS — M6281 Muscle weakness (generalized): Secondary | ICD-10-CM | POA: Diagnosis not present

## 2016-03-17 DIAGNOSIS — R41841 Cognitive communication deficit: Secondary | ICD-10-CM | POA: Diagnosis not present

## 2016-03-17 DIAGNOSIS — M6281 Muscle weakness (generalized): Secondary | ICD-10-CM | POA: Diagnosis not present

## 2016-03-17 DIAGNOSIS — R1310 Dysphagia, unspecified: Secondary | ICD-10-CM | POA: Diagnosis not present

## 2016-03-17 DIAGNOSIS — E119 Type 2 diabetes mellitus without complications: Secondary | ICD-10-CM | POA: Diagnosis not present

## 2016-03-18 DIAGNOSIS — R41841 Cognitive communication deficit: Secondary | ICD-10-CM | POA: Diagnosis not present

## 2016-03-18 DIAGNOSIS — E119 Type 2 diabetes mellitus without complications: Secondary | ICD-10-CM | POA: Diagnosis not present

## 2016-03-18 DIAGNOSIS — R1310 Dysphagia, unspecified: Secondary | ICD-10-CM | POA: Diagnosis not present

## 2016-03-18 DIAGNOSIS — M6281 Muscle weakness (generalized): Secondary | ICD-10-CM | POA: Diagnosis not present

## 2016-03-19 DIAGNOSIS — R41841 Cognitive communication deficit: Secondary | ICD-10-CM | POA: Diagnosis not present

## 2016-03-19 DIAGNOSIS — M6281 Muscle weakness (generalized): Secondary | ICD-10-CM | POA: Diagnosis not present

## 2016-03-19 DIAGNOSIS — R1310 Dysphagia, unspecified: Secondary | ICD-10-CM | POA: Diagnosis not present

## 2016-03-19 DIAGNOSIS — E119 Type 2 diabetes mellitus without complications: Secondary | ICD-10-CM | POA: Diagnosis not present

## 2016-03-22 DIAGNOSIS — E119 Type 2 diabetes mellitus without complications: Secondary | ICD-10-CM | POA: Diagnosis not present

## 2016-03-22 DIAGNOSIS — R41841 Cognitive communication deficit: Secondary | ICD-10-CM | POA: Diagnosis not present

## 2016-03-22 DIAGNOSIS — M6281 Muscle weakness (generalized): Secondary | ICD-10-CM | POA: Diagnosis not present

## 2016-03-22 DIAGNOSIS — R1310 Dysphagia, unspecified: Secondary | ICD-10-CM | POA: Diagnosis not present

## 2016-03-22 DIAGNOSIS — Z961 Presence of intraocular lens: Secondary | ICD-10-CM | POA: Diagnosis not present

## 2016-03-22 DIAGNOSIS — Z794 Long term (current) use of insulin: Secondary | ICD-10-CM | POA: Diagnosis not present

## 2016-03-22 DIAGNOSIS — H353131 Nonexudative age-related macular degeneration, bilateral, early dry stage: Secondary | ICD-10-CM | POA: Diagnosis not present

## 2016-03-23 DIAGNOSIS — E119 Type 2 diabetes mellitus without complications: Secondary | ICD-10-CM | POA: Diagnosis not present

## 2016-03-23 DIAGNOSIS — R41841 Cognitive communication deficit: Secondary | ICD-10-CM | POA: Diagnosis not present

## 2016-03-23 DIAGNOSIS — M6281 Muscle weakness (generalized): Secondary | ICD-10-CM | POA: Diagnosis not present

## 2016-03-23 DIAGNOSIS — R1310 Dysphagia, unspecified: Secondary | ICD-10-CM | POA: Diagnosis not present

## 2016-03-24 DIAGNOSIS — R1310 Dysphagia, unspecified: Secondary | ICD-10-CM | POA: Diagnosis not present

## 2016-03-24 DIAGNOSIS — E119 Type 2 diabetes mellitus without complications: Secondary | ICD-10-CM | POA: Diagnosis not present

## 2016-03-24 DIAGNOSIS — M6281 Muscle weakness (generalized): Secondary | ICD-10-CM | POA: Diagnosis not present

## 2016-03-24 DIAGNOSIS — R41841 Cognitive communication deficit: Secondary | ICD-10-CM | POA: Diagnosis not present

## 2016-03-25 DIAGNOSIS — E119 Type 2 diabetes mellitus without complications: Secondary | ICD-10-CM | POA: Diagnosis not present

## 2016-03-25 DIAGNOSIS — R1310 Dysphagia, unspecified: Secondary | ICD-10-CM | POA: Diagnosis not present

## 2016-03-25 DIAGNOSIS — R41841 Cognitive communication deficit: Secondary | ICD-10-CM | POA: Diagnosis not present

## 2016-03-25 DIAGNOSIS — M6281 Muscle weakness (generalized): Secondary | ICD-10-CM | POA: Diagnosis not present

## 2016-03-26 DIAGNOSIS — R41841 Cognitive communication deficit: Secondary | ICD-10-CM | POA: Diagnosis not present

## 2016-03-26 DIAGNOSIS — E119 Type 2 diabetes mellitus without complications: Secondary | ICD-10-CM | POA: Diagnosis not present

## 2016-03-26 DIAGNOSIS — M6281 Muscle weakness (generalized): Secondary | ICD-10-CM | POA: Diagnosis not present

## 2016-03-26 DIAGNOSIS — R1310 Dysphagia, unspecified: Secondary | ICD-10-CM | POA: Diagnosis not present

## 2016-03-27 DIAGNOSIS — R1310 Dysphagia, unspecified: Secondary | ICD-10-CM | POA: Diagnosis not present

## 2016-03-27 DIAGNOSIS — M6281 Muscle weakness (generalized): Secondary | ICD-10-CM | POA: Diagnosis not present

## 2016-03-27 DIAGNOSIS — E119 Type 2 diabetes mellitus without complications: Secondary | ICD-10-CM | POA: Diagnosis not present

## 2016-03-27 DIAGNOSIS — R41841 Cognitive communication deficit: Secondary | ICD-10-CM | POA: Diagnosis not present

## 2016-03-28 DIAGNOSIS — R41841 Cognitive communication deficit: Secondary | ICD-10-CM | POA: Diagnosis not present

## 2016-03-28 DIAGNOSIS — R1310 Dysphagia, unspecified: Secondary | ICD-10-CM | POA: Diagnosis not present

## 2016-03-28 DIAGNOSIS — E119 Type 2 diabetes mellitus without complications: Secondary | ICD-10-CM | POA: Diagnosis not present

## 2016-03-28 DIAGNOSIS — M6281 Muscle weakness (generalized): Secondary | ICD-10-CM | POA: Diagnosis not present

## 2016-03-29 DIAGNOSIS — E119 Type 2 diabetes mellitus without complications: Secondary | ICD-10-CM | POA: Diagnosis not present

## 2016-03-29 DIAGNOSIS — R1310 Dysphagia, unspecified: Secondary | ICD-10-CM | POA: Diagnosis not present

## 2016-03-29 DIAGNOSIS — M6281 Muscle weakness (generalized): Secondary | ICD-10-CM | POA: Diagnosis not present

## 2016-03-29 DIAGNOSIS — R41841 Cognitive communication deficit: Secondary | ICD-10-CM | POA: Diagnosis not present

## 2016-03-30 DIAGNOSIS — R41841 Cognitive communication deficit: Secondary | ICD-10-CM | POA: Diagnosis not present

## 2016-03-30 DIAGNOSIS — E119 Type 2 diabetes mellitus without complications: Secondary | ICD-10-CM | POA: Diagnosis not present

## 2016-03-30 DIAGNOSIS — R1310 Dysphagia, unspecified: Secondary | ICD-10-CM | POA: Diagnosis not present

## 2016-03-30 DIAGNOSIS — M6281 Muscle weakness (generalized): Secondary | ICD-10-CM | POA: Diagnosis not present

## 2016-03-31 DIAGNOSIS — R41841 Cognitive communication deficit: Secondary | ICD-10-CM | POA: Diagnosis not present

## 2016-03-31 DIAGNOSIS — R1310 Dysphagia, unspecified: Secondary | ICD-10-CM | POA: Diagnosis not present

## 2016-03-31 DIAGNOSIS — M6281 Muscle weakness (generalized): Secondary | ICD-10-CM | POA: Diagnosis not present

## 2016-03-31 DIAGNOSIS — E119 Type 2 diabetes mellitus without complications: Secondary | ICD-10-CM | POA: Diagnosis not present

## 2016-04-01 DIAGNOSIS — R1310 Dysphagia, unspecified: Secondary | ICD-10-CM | POA: Diagnosis not present

## 2016-04-01 DIAGNOSIS — R41841 Cognitive communication deficit: Secondary | ICD-10-CM | POA: Diagnosis not present

## 2016-04-01 DIAGNOSIS — E119 Type 2 diabetes mellitus without complications: Secondary | ICD-10-CM | POA: Diagnosis not present

## 2016-04-01 DIAGNOSIS — M6281 Muscle weakness (generalized): Secondary | ICD-10-CM | POA: Diagnosis not present

## 2016-04-02 DIAGNOSIS — M6281 Muscle weakness (generalized): Secondary | ICD-10-CM | POA: Diagnosis not present

## 2016-04-02 DIAGNOSIS — R41841 Cognitive communication deficit: Secondary | ICD-10-CM | POA: Diagnosis not present

## 2016-04-02 DIAGNOSIS — E119 Type 2 diabetes mellitus without complications: Secondary | ICD-10-CM | POA: Diagnosis not present

## 2016-04-02 DIAGNOSIS — R1310 Dysphagia, unspecified: Secondary | ICD-10-CM | POA: Diagnosis not present

## 2016-04-05 DIAGNOSIS — E119 Type 2 diabetes mellitus without complications: Secondary | ICD-10-CM | POA: Diagnosis not present

## 2016-04-05 DIAGNOSIS — R1310 Dysphagia, unspecified: Secondary | ICD-10-CM | POA: Diagnosis not present

## 2016-04-05 DIAGNOSIS — R41841 Cognitive communication deficit: Secondary | ICD-10-CM | POA: Diagnosis not present

## 2016-04-05 DIAGNOSIS — M6281 Muscle weakness (generalized): Secondary | ICD-10-CM | POA: Diagnosis not present

## 2016-04-06 DIAGNOSIS — R1310 Dysphagia, unspecified: Secondary | ICD-10-CM | POA: Diagnosis not present

## 2016-04-06 DIAGNOSIS — K219 Gastro-esophageal reflux disease without esophagitis: Secondary | ICD-10-CM | POA: Diagnosis not present

## 2016-04-06 DIAGNOSIS — M6281 Muscle weakness (generalized): Secondary | ICD-10-CM | POA: Diagnosis not present

## 2016-04-06 DIAGNOSIS — E119 Type 2 diabetes mellitus without complications: Secondary | ICD-10-CM | POA: Diagnosis not present

## 2016-04-06 DIAGNOSIS — R41841 Cognitive communication deficit: Secondary | ICD-10-CM | POA: Diagnosis not present

## 2016-04-07 DIAGNOSIS — R41841 Cognitive communication deficit: Secondary | ICD-10-CM | POA: Diagnosis not present

## 2016-04-07 DIAGNOSIS — R1310 Dysphagia, unspecified: Secondary | ICD-10-CM | POA: Diagnosis not present

## 2016-04-07 DIAGNOSIS — M6281 Muscle weakness (generalized): Secondary | ICD-10-CM | POA: Diagnosis not present

## 2016-04-07 DIAGNOSIS — E119 Type 2 diabetes mellitus without complications: Secondary | ICD-10-CM | POA: Diagnosis not present

## 2016-04-08 DIAGNOSIS — E039 Hypothyroidism, unspecified: Secondary | ICD-10-CM | POA: Diagnosis not present

## 2016-04-08 DIAGNOSIS — M6281 Muscle weakness (generalized): Secondary | ICD-10-CM | POA: Diagnosis not present

## 2016-04-08 DIAGNOSIS — E119 Type 2 diabetes mellitus without complications: Secondary | ICD-10-CM | POA: Diagnosis not present

## 2016-04-08 DIAGNOSIS — E785 Hyperlipidemia, unspecified: Secondary | ICD-10-CM | POA: Diagnosis not present

## 2016-04-08 DIAGNOSIS — R41841 Cognitive communication deficit: Secondary | ICD-10-CM | POA: Diagnosis not present

## 2016-04-08 DIAGNOSIS — I10 Essential (primary) hypertension: Secondary | ICD-10-CM | POA: Diagnosis not present

## 2016-04-08 DIAGNOSIS — R1310 Dysphagia, unspecified: Secondary | ICD-10-CM | POA: Diagnosis not present

## 2016-04-08 DIAGNOSIS — E559 Vitamin D deficiency, unspecified: Secondary | ICD-10-CM | POA: Diagnosis not present

## 2016-04-08 DIAGNOSIS — D649 Anemia, unspecified: Secondary | ICD-10-CM | POA: Diagnosis not present

## 2016-04-09 DIAGNOSIS — M6281 Muscle weakness (generalized): Secondary | ICD-10-CM | POA: Diagnosis not present

## 2016-04-09 DIAGNOSIS — R41841 Cognitive communication deficit: Secondary | ICD-10-CM | POA: Diagnosis not present

## 2016-04-09 DIAGNOSIS — E119 Type 2 diabetes mellitus without complications: Secondary | ICD-10-CM | POA: Diagnosis not present

## 2016-04-09 DIAGNOSIS — R1310 Dysphagia, unspecified: Secondary | ICD-10-CM | POA: Diagnosis not present

## 2016-04-12 DIAGNOSIS — R41841 Cognitive communication deficit: Secondary | ICD-10-CM | POA: Diagnosis not present

## 2016-04-12 DIAGNOSIS — R1310 Dysphagia, unspecified: Secondary | ICD-10-CM | POA: Diagnosis not present

## 2016-04-12 DIAGNOSIS — M6281 Muscle weakness (generalized): Secondary | ICD-10-CM | POA: Diagnosis not present

## 2016-04-12 DIAGNOSIS — E119 Type 2 diabetes mellitus without complications: Secondary | ICD-10-CM | POA: Diagnosis not present

## 2016-04-13 DIAGNOSIS — R1310 Dysphagia, unspecified: Secondary | ICD-10-CM | POA: Diagnosis not present

## 2016-04-13 DIAGNOSIS — E119 Type 2 diabetes mellitus without complications: Secondary | ICD-10-CM | POA: Diagnosis not present

## 2016-04-13 DIAGNOSIS — M6281 Muscle weakness (generalized): Secondary | ICD-10-CM | POA: Diagnosis not present

## 2016-04-13 DIAGNOSIS — R41841 Cognitive communication deficit: Secondary | ICD-10-CM | POA: Diagnosis not present

## 2016-04-14 DIAGNOSIS — R41841 Cognitive communication deficit: Secondary | ICD-10-CM | POA: Diagnosis not present

## 2016-04-14 DIAGNOSIS — E119 Type 2 diabetes mellitus without complications: Secondary | ICD-10-CM | POA: Diagnosis not present

## 2016-04-14 DIAGNOSIS — R1310 Dysphagia, unspecified: Secondary | ICD-10-CM | POA: Diagnosis not present

## 2016-04-14 DIAGNOSIS — M6281 Muscle weakness (generalized): Secondary | ICD-10-CM | POA: Diagnosis not present

## 2016-04-15 DIAGNOSIS — N390 Urinary tract infection, site not specified: Secondary | ICD-10-CM | POA: Diagnosis not present

## 2016-04-15 DIAGNOSIS — R1310 Dysphagia, unspecified: Secondary | ICD-10-CM | POA: Diagnosis not present

## 2016-04-15 DIAGNOSIS — M6281 Muscle weakness (generalized): Secondary | ICD-10-CM | POA: Diagnosis not present

## 2016-04-15 DIAGNOSIS — K5900 Constipation, unspecified: Secondary | ICD-10-CM | POA: Diagnosis not present

## 2016-04-15 DIAGNOSIS — R41841 Cognitive communication deficit: Secondary | ICD-10-CM | POA: Diagnosis not present

## 2016-04-15 DIAGNOSIS — R197 Diarrhea, unspecified: Secondary | ICD-10-CM | POA: Diagnosis not present

## 2016-04-15 DIAGNOSIS — E119 Type 2 diabetes mellitus without complications: Secondary | ICD-10-CM | POA: Diagnosis not present

## 2016-04-16 DIAGNOSIS — R1310 Dysphagia, unspecified: Secondary | ICD-10-CM | POA: Diagnosis not present

## 2016-04-16 DIAGNOSIS — M6281 Muscle weakness (generalized): Secondary | ICD-10-CM | POA: Diagnosis not present

## 2016-04-16 DIAGNOSIS — R41841 Cognitive communication deficit: Secondary | ICD-10-CM | POA: Diagnosis not present

## 2016-04-16 DIAGNOSIS — E119 Type 2 diabetes mellitus without complications: Secondary | ICD-10-CM | POA: Diagnosis not present

## 2016-04-17 DIAGNOSIS — F0391 Unspecified dementia with behavioral disturbance: Secondary | ICD-10-CM | POA: Diagnosis not present

## 2016-04-17 DIAGNOSIS — F29 Unspecified psychosis not due to a substance or known physiological condition: Secondary | ICD-10-CM | POA: Diagnosis not present

## 2016-04-17 DIAGNOSIS — F339 Major depressive disorder, recurrent, unspecified: Secondary | ICD-10-CM | POA: Diagnosis not present

## 2016-04-18 DIAGNOSIS — R1310 Dysphagia, unspecified: Secondary | ICD-10-CM | POA: Diagnosis not present

## 2016-04-18 DIAGNOSIS — M6281 Muscle weakness (generalized): Secondary | ICD-10-CM | POA: Diagnosis not present

## 2016-04-18 DIAGNOSIS — R41841 Cognitive communication deficit: Secondary | ICD-10-CM | POA: Diagnosis not present

## 2016-04-18 DIAGNOSIS — E119 Type 2 diabetes mellitus without complications: Secondary | ICD-10-CM | POA: Diagnosis not present

## 2016-04-20 DIAGNOSIS — M6281 Muscle weakness (generalized): Secondary | ICD-10-CM | POA: Diagnosis not present

## 2016-04-20 DIAGNOSIS — R1310 Dysphagia, unspecified: Secondary | ICD-10-CM | POA: Diagnosis not present

## 2016-04-20 DIAGNOSIS — E119 Type 2 diabetes mellitus without complications: Secondary | ICD-10-CM | POA: Diagnosis not present

## 2016-04-20 DIAGNOSIS — R41841 Cognitive communication deficit: Secondary | ICD-10-CM | POA: Diagnosis not present

## 2016-04-21 DIAGNOSIS — M6281 Muscle weakness (generalized): Secondary | ICD-10-CM | POA: Diagnosis not present

## 2016-04-21 DIAGNOSIS — E119 Type 2 diabetes mellitus without complications: Secondary | ICD-10-CM | POA: Diagnosis not present

## 2016-04-21 DIAGNOSIS — R1310 Dysphagia, unspecified: Secondary | ICD-10-CM | POA: Diagnosis not present

## 2016-04-21 DIAGNOSIS — R41841 Cognitive communication deficit: Secondary | ICD-10-CM | POA: Diagnosis not present

## 2016-04-22 DIAGNOSIS — E119 Type 2 diabetes mellitus without complications: Secondary | ICD-10-CM | POA: Diagnosis not present

## 2016-04-22 DIAGNOSIS — R41841 Cognitive communication deficit: Secondary | ICD-10-CM | POA: Diagnosis not present

## 2016-04-22 DIAGNOSIS — R1310 Dysphagia, unspecified: Secondary | ICD-10-CM | POA: Diagnosis not present

## 2016-04-22 DIAGNOSIS — M6281 Muscle weakness (generalized): Secondary | ICD-10-CM | POA: Diagnosis not present

## 2016-04-24 DIAGNOSIS — E119 Type 2 diabetes mellitus without complications: Secondary | ICD-10-CM | POA: Diagnosis not present

## 2016-04-24 DIAGNOSIS — R41841 Cognitive communication deficit: Secondary | ICD-10-CM | POA: Diagnosis not present

## 2016-04-24 DIAGNOSIS — R1310 Dysphagia, unspecified: Secondary | ICD-10-CM | POA: Diagnosis not present

## 2016-04-24 DIAGNOSIS — M6281 Muscle weakness (generalized): Secondary | ICD-10-CM | POA: Diagnosis not present

## 2016-04-25 DIAGNOSIS — E119 Type 2 diabetes mellitus without complications: Secondary | ICD-10-CM | POA: Diagnosis not present

## 2016-04-25 DIAGNOSIS — R1310 Dysphagia, unspecified: Secondary | ICD-10-CM | POA: Diagnosis not present

## 2016-04-25 DIAGNOSIS — R41841 Cognitive communication deficit: Secondary | ICD-10-CM | POA: Diagnosis not present

## 2016-04-25 DIAGNOSIS — M6281 Muscle weakness (generalized): Secondary | ICD-10-CM | POA: Diagnosis not present

## 2016-04-26 DIAGNOSIS — R1310 Dysphagia, unspecified: Secondary | ICD-10-CM | POA: Diagnosis not present

## 2016-04-26 DIAGNOSIS — M6281 Muscle weakness (generalized): Secondary | ICD-10-CM | POA: Diagnosis not present

## 2016-04-26 DIAGNOSIS — E119 Type 2 diabetes mellitus without complications: Secondary | ICD-10-CM | POA: Diagnosis not present

## 2016-04-26 DIAGNOSIS — R41841 Cognitive communication deficit: Secondary | ICD-10-CM | POA: Diagnosis not present

## 2016-04-27 DIAGNOSIS — E1151 Type 2 diabetes mellitus with diabetic peripheral angiopathy without gangrene: Secondary | ICD-10-CM | POA: Diagnosis not present

## 2016-04-27 DIAGNOSIS — E1140 Type 2 diabetes mellitus with diabetic neuropathy, unspecified: Secondary | ICD-10-CM | POA: Diagnosis not present

## 2016-04-29 DIAGNOSIS — R41841 Cognitive communication deficit: Secondary | ICD-10-CM | POA: Diagnosis not present

## 2016-04-29 DIAGNOSIS — R1310 Dysphagia, unspecified: Secondary | ICD-10-CM | POA: Diagnosis not present

## 2016-04-29 DIAGNOSIS — M6281 Muscle weakness (generalized): Secondary | ICD-10-CM | POA: Diagnosis not present

## 2016-04-29 DIAGNOSIS — E119 Type 2 diabetes mellitus without complications: Secondary | ICD-10-CM | POA: Diagnosis not present

## 2016-04-30 DIAGNOSIS — R41841 Cognitive communication deficit: Secondary | ICD-10-CM | POA: Diagnosis not present

## 2016-04-30 DIAGNOSIS — E119 Type 2 diabetes mellitus without complications: Secondary | ICD-10-CM | POA: Diagnosis not present

## 2016-04-30 DIAGNOSIS — M6281 Muscle weakness (generalized): Secondary | ICD-10-CM | POA: Diagnosis not present

## 2016-04-30 DIAGNOSIS — R1310 Dysphagia, unspecified: Secondary | ICD-10-CM | POA: Diagnosis not present

## 2016-05-01 DIAGNOSIS — R41841 Cognitive communication deficit: Secondary | ICD-10-CM | POA: Diagnosis not present

## 2016-05-01 DIAGNOSIS — R1310 Dysphagia, unspecified: Secondary | ICD-10-CM | POA: Diagnosis not present

## 2016-05-01 DIAGNOSIS — E119 Type 2 diabetes mellitus without complications: Secondary | ICD-10-CM | POA: Diagnosis not present

## 2016-05-01 DIAGNOSIS — M6281 Muscle weakness (generalized): Secondary | ICD-10-CM | POA: Diagnosis not present

## 2016-05-02 DIAGNOSIS — M6281 Muscle weakness (generalized): Secondary | ICD-10-CM | POA: Diagnosis not present

## 2016-05-02 DIAGNOSIS — R41841 Cognitive communication deficit: Secondary | ICD-10-CM | POA: Diagnosis not present

## 2016-05-02 DIAGNOSIS — E119 Type 2 diabetes mellitus without complications: Secondary | ICD-10-CM | POA: Diagnosis not present

## 2016-05-02 DIAGNOSIS — R1310 Dysphagia, unspecified: Secondary | ICD-10-CM | POA: Diagnosis not present

## 2016-05-04 DIAGNOSIS — R41841 Cognitive communication deficit: Secondary | ICD-10-CM | POA: Diagnosis not present

## 2016-05-04 DIAGNOSIS — M6281 Muscle weakness (generalized): Secondary | ICD-10-CM | POA: Diagnosis not present

## 2016-05-04 DIAGNOSIS — R1310 Dysphagia, unspecified: Secondary | ICD-10-CM | POA: Diagnosis not present

## 2016-05-04 DIAGNOSIS — E119 Type 2 diabetes mellitus without complications: Secondary | ICD-10-CM | POA: Diagnosis not present

## 2016-05-05 DIAGNOSIS — R1310 Dysphagia, unspecified: Secondary | ICD-10-CM | POA: Diagnosis not present

## 2016-05-05 DIAGNOSIS — M6281 Muscle weakness (generalized): Secondary | ICD-10-CM | POA: Diagnosis not present

## 2016-05-05 DIAGNOSIS — E119 Type 2 diabetes mellitus without complications: Secondary | ICD-10-CM | POA: Diagnosis not present

## 2016-05-05 DIAGNOSIS — R41841 Cognitive communication deficit: Secondary | ICD-10-CM | POA: Diagnosis not present

## 2016-05-06 DIAGNOSIS — M6281 Muscle weakness (generalized): Secondary | ICD-10-CM | POA: Diagnosis not present

## 2016-05-06 DIAGNOSIS — E119 Type 2 diabetes mellitus without complications: Secondary | ICD-10-CM | POA: Diagnosis not present

## 2016-05-06 DIAGNOSIS — R1310 Dysphagia, unspecified: Secondary | ICD-10-CM | POA: Diagnosis not present

## 2016-05-06 DIAGNOSIS — R41841 Cognitive communication deficit: Secondary | ICD-10-CM | POA: Diagnosis not present

## 2016-05-07 DIAGNOSIS — R41841 Cognitive communication deficit: Secondary | ICD-10-CM | POA: Diagnosis not present

## 2016-05-07 DIAGNOSIS — E119 Type 2 diabetes mellitus without complications: Secondary | ICD-10-CM | POA: Diagnosis not present

## 2016-05-07 DIAGNOSIS — R1310 Dysphagia, unspecified: Secondary | ICD-10-CM | POA: Diagnosis not present

## 2016-05-07 DIAGNOSIS — M6281 Muscle weakness (generalized): Secondary | ICD-10-CM | POA: Diagnosis not present

## 2016-05-09 DIAGNOSIS — M6281 Muscle weakness (generalized): Secondary | ICD-10-CM | POA: Diagnosis not present

## 2016-05-09 DIAGNOSIS — R1310 Dysphagia, unspecified: Secondary | ICD-10-CM | POA: Diagnosis not present

## 2016-05-09 DIAGNOSIS — E119 Type 2 diabetes mellitus without complications: Secondary | ICD-10-CM | POA: Diagnosis not present

## 2016-05-09 DIAGNOSIS — R41841 Cognitive communication deficit: Secondary | ICD-10-CM | POA: Diagnosis not present

## 2016-05-10 DIAGNOSIS — R41841 Cognitive communication deficit: Secondary | ICD-10-CM | POA: Diagnosis not present

## 2016-05-10 DIAGNOSIS — E119 Type 2 diabetes mellitus without complications: Secondary | ICD-10-CM | POA: Diagnosis not present

## 2016-05-10 DIAGNOSIS — M6281 Muscle weakness (generalized): Secondary | ICD-10-CM | POA: Diagnosis not present

## 2016-05-10 DIAGNOSIS — R1310 Dysphagia, unspecified: Secondary | ICD-10-CM | POA: Diagnosis not present

## 2016-05-11 DIAGNOSIS — R1310 Dysphagia, unspecified: Secondary | ICD-10-CM | POA: Diagnosis not present

## 2016-05-11 DIAGNOSIS — R41841 Cognitive communication deficit: Secondary | ICD-10-CM | POA: Diagnosis not present

## 2016-05-11 DIAGNOSIS — E119 Type 2 diabetes mellitus without complications: Secondary | ICD-10-CM | POA: Diagnosis not present

## 2016-05-11 DIAGNOSIS — M6281 Muscle weakness (generalized): Secondary | ICD-10-CM | POA: Diagnosis not present

## 2016-05-13 DIAGNOSIS — R41841 Cognitive communication deficit: Secondary | ICD-10-CM | POA: Diagnosis not present

## 2016-05-13 DIAGNOSIS — E119 Type 2 diabetes mellitus without complications: Secondary | ICD-10-CM | POA: Diagnosis not present

## 2016-05-13 DIAGNOSIS — R1310 Dysphagia, unspecified: Secondary | ICD-10-CM | POA: Diagnosis not present

## 2016-05-13 DIAGNOSIS — M6281 Muscle weakness (generalized): Secondary | ICD-10-CM | POA: Diagnosis not present

## 2016-05-14 DIAGNOSIS — E119 Type 2 diabetes mellitus without complications: Secondary | ICD-10-CM | POA: Diagnosis not present

## 2016-05-14 DIAGNOSIS — M6281 Muscle weakness (generalized): Secondary | ICD-10-CM | POA: Diagnosis not present

## 2016-05-14 DIAGNOSIS — R41841 Cognitive communication deficit: Secondary | ICD-10-CM | POA: Diagnosis not present

## 2016-05-14 DIAGNOSIS — R1310 Dysphagia, unspecified: Secondary | ICD-10-CM | POA: Diagnosis not present

## 2016-05-16 DIAGNOSIS — R41841 Cognitive communication deficit: Secondary | ICD-10-CM | POA: Diagnosis not present

## 2016-05-16 DIAGNOSIS — R1310 Dysphagia, unspecified: Secondary | ICD-10-CM | POA: Diagnosis not present

## 2016-05-16 DIAGNOSIS — E119 Type 2 diabetes mellitus without complications: Secondary | ICD-10-CM | POA: Diagnosis not present

## 2016-05-16 DIAGNOSIS — M6281 Muscle weakness (generalized): Secondary | ICD-10-CM | POA: Diagnosis not present

## 2016-05-17 DIAGNOSIS — R41841 Cognitive communication deficit: Secondary | ICD-10-CM | POA: Diagnosis not present

## 2016-05-17 DIAGNOSIS — M6281 Muscle weakness (generalized): Secondary | ICD-10-CM | POA: Diagnosis not present

## 2016-05-17 DIAGNOSIS — R1310 Dysphagia, unspecified: Secondary | ICD-10-CM | POA: Diagnosis not present

## 2016-05-17 DIAGNOSIS — E119 Type 2 diabetes mellitus without complications: Secondary | ICD-10-CM | POA: Diagnosis not present

## 2016-05-18 DIAGNOSIS — R1310 Dysphagia, unspecified: Secondary | ICD-10-CM | POA: Diagnosis not present

## 2016-05-18 DIAGNOSIS — R41841 Cognitive communication deficit: Secondary | ICD-10-CM | POA: Diagnosis not present

## 2016-05-18 DIAGNOSIS — M6281 Muscle weakness (generalized): Secondary | ICD-10-CM | POA: Diagnosis not present

## 2016-05-18 DIAGNOSIS — E119 Type 2 diabetes mellitus without complications: Secondary | ICD-10-CM | POA: Diagnosis not present

## 2016-05-20 DIAGNOSIS — E119 Type 2 diabetes mellitus without complications: Secondary | ICD-10-CM | POA: Diagnosis not present

## 2016-05-20 DIAGNOSIS — R41841 Cognitive communication deficit: Secondary | ICD-10-CM | POA: Diagnosis not present

## 2016-05-20 DIAGNOSIS — M6281 Muscle weakness (generalized): Secondary | ICD-10-CM | POA: Diagnosis not present

## 2016-05-20 DIAGNOSIS — R1310 Dysphagia, unspecified: Secondary | ICD-10-CM | POA: Diagnosis not present

## 2016-05-21 DIAGNOSIS — E119 Type 2 diabetes mellitus without complications: Secondary | ICD-10-CM | POA: Diagnosis not present

## 2016-05-21 DIAGNOSIS — M6281 Muscle weakness (generalized): Secondary | ICD-10-CM | POA: Diagnosis not present

## 2016-05-21 DIAGNOSIS — R1310 Dysphagia, unspecified: Secondary | ICD-10-CM | POA: Diagnosis not present

## 2016-05-21 DIAGNOSIS — R41841 Cognitive communication deficit: Secondary | ICD-10-CM | POA: Diagnosis not present

## 2016-05-23 DIAGNOSIS — M6281 Muscle weakness (generalized): Secondary | ICD-10-CM | POA: Diagnosis not present

## 2016-05-23 DIAGNOSIS — E119 Type 2 diabetes mellitus without complications: Secondary | ICD-10-CM | POA: Diagnosis not present

## 2016-05-23 DIAGNOSIS — R1310 Dysphagia, unspecified: Secondary | ICD-10-CM | POA: Diagnosis not present

## 2016-05-23 DIAGNOSIS — R41841 Cognitive communication deficit: Secondary | ICD-10-CM | POA: Diagnosis not present

## 2016-05-24 DIAGNOSIS — R21 Rash and other nonspecific skin eruption: Secondary | ICD-10-CM | POA: Diagnosis not present

## 2016-05-24 DIAGNOSIS — R05 Cough: Secondary | ICD-10-CM | POA: Diagnosis not present

## 2016-05-24 DIAGNOSIS — M6281 Muscle weakness (generalized): Secondary | ICD-10-CM | POA: Diagnosis not present

## 2016-05-24 DIAGNOSIS — E119 Type 2 diabetes mellitus without complications: Secondary | ICD-10-CM | POA: Diagnosis not present

## 2016-05-24 DIAGNOSIS — R41841 Cognitive communication deficit: Secondary | ICD-10-CM | POA: Diagnosis not present

## 2016-05-24 DIAGNOSIS — R1310 Dysphagia, unspecified: Secondary | ICD-10-CM | POA: Diagnosis not present

## 2016-05-24 DIAGNOSIS — R627 Adult failure to thrive: Secondary | ICD-10-CM | POA: Diagnosis not present

## 2016-05-25 DIAGNOSIS — M6281 Muscle weakness (generalized): Secondary | ICD-10-CM | POA: Diagnosis not present

## 2016-05-25 DIAGNOSIS — R1310 Dysphagia, unspecified: Secondary | ICD-10-CM | POA: Diagnosis not present

## 2016-05-25 DIAGNOSIS — R0989 Other specified symptoms and signs involving the circulatory and respiratory systems: Secondary | ICD-10-CM | POA: Diagnosis not present

## 2016-05-25 DIAGNOSIS — D649 Anemia, unspecified: Secondary | ICD-10-CM | POA: Diagnosis not present

## 2016-05-25 DIAGNOSIS — E119 Type 2 diabetes mellitus without complications: Secondary | ICD-10-CM | POA: Diagnosis not present

## 2016-05-25 DIAGNOSIS — R41841 Cognitive communication deficit: Secondary | ICD-10-CM | POA: Diagnosis not present

## 2016-05-25 DIAGNOSIS — R5081 Fever presenting with conditions classified elsewhere: Secondary | ICD-10-CM | POA: Diagnosis not present

## 2016-05-27 DIAGNOSIS — E119 Type 2 diabetes mellitus without complications: Secondary | ICD-10-CM | POA: Diagnosis not present

## 2016-05-27 DIAGNOSIS — M6281 Muscle weakness (generalized): Secondary | ICD-10-CM | POA: Diagnosis not present

## 2016-05-27 DIAGNOSIS — R41841 Cognitive communication deficit: Secondary | ICD-10-CM | POA: Diagnosis not present

## 2016-05-27 DIAGNOSIS — R1310 Dysphagia, unspecified: Secondary | ICD-10-CM | POA: Diagnosis not present

## 2016-05-28 DIAGNOSIS — R1310 Dysphagia, unspecified: Secondary | ICD-10-CM | POA: Diagnosis not present

## 2016-05-28 DIAGNOSIS — M6281 Muscle weakness (generalized): Secondary | ICD-10-CM | POA: Diagnosis not present

## 2016-05-28 DIAGNOSIS — R41841 Cognitive communication deficit: Secondary | ICD-10-CM | POA: Diagnosis not present

## 2016-05-28 DIAGNOSIS — E119 Type 2 diabetes mellitus without complications: Secondary | ICD-10-CM | POA: Diagnosis not present

## 2016-05-29 DIAGNOSIS — F339 Major depressive disorder, recurrent, unspecified: Secondary | ICD-10-CM | POA: Diagnosis not present

## 2016-05-29 DIAGNOSIS — F29 Unspecified psychosis not due to a substance or known physiological condition: Secondary | ICD-10-CM | POA: Diagnosis not present

## 2016-05-29 DIAGNOSIS — F0391 Unspecified dementia with behavioral disturbance: Secondary | ICD-10-CM | POA: Diagnosis not present

## 2016-05-31 DIAGNOSIS — R0981 Nasal congestion: Secondary | ICD-10-CM | POA: Diagnosis not present

## 2016-05-31 DIAGNOSIS — T7840XS Allergy, unspecified, sequela: Secondary | ICD-10-CM | POA: Diagnosis not present

## 2016-05-31 DIAGNOSIS — R05 Cough: Secondary | ICD-10-CM | POA: Diagnosis not present

## 2016-05-31 DIAGNOSIS — K219 Gastro-esophageal reflux disease without esophagitis: Secondary | ICD-10-CM | POA: Diagnosis not present

## 2016-06-03 DIAGNOSIS — R05 Cough: Secondary | ICD-10-CM | POA: Diagnosis not present

## 2016-06-03 DIAGNOSIS — K219 Gastro-esophageal reflux disease without esophagitis: Secondary | ICD-10-CM | POA: Diagnosis not present

## 2016-06-03 DIAGNOSIS — R0981 Nasal congestion: Secondary | ICD-10-CM | POA: Diagnosis not present

## 2016-06-03 DIAGNOSIS — T7840XS Allergy, unspecified, sequela: Secondary | ICD-10-CM | POA: Diagnosis not present

## 2016-06-15 DIAGNOSIS — T7840XS Allergy, unspecified, sequela: Secondary | ICD-10-CM | POA: Diagnosis not present

## 2016-06-15 DIAGNOSIS — R0981 Nasal congestion: Secondary | ICD-10-CM | POA: Diagnosis not present

## 2016-06-15 DIAGNOSIS — R05 Cough: Secondary | ICD-10-CM | POA: Diagnosis not present

## 2016-07-09 DIAGNOSIS — Z789 Other specified health status: Secondary | ICD-10-CM | POA: Diagnosis not present

## 2016-07-09 DIAGNOSIS — E119 Type 2 diabetes mellitus without complications: Secondary | ICD-10-CM | POA: Diagnosis not present

## 2016-07-09 DIAGNOSIS — E559 Vitamin D deficiency, unspecified: Secondary | ICD-10-CM | POA: Diagnosis not present

## 2016-07-09 DIAGNOSIS — E785 Hyperlipidemia, unspecified: Secondary | ICD-10-CM | POA: Diagnosis not present

## 2016-07-09 DIAGNOSIS — R4182 Altered mental status, unspecified: Secondary | ICD-10-CM | POA: Diagnosis not present

## 2016-07-12 DIAGNOSIS — E1140 Type 2 diabetes mellitus with diabetic neuropathy, unspecified: Secondary | ICD-10-CM | POA: Diagnosis not present

## 2016-07-12 DIAGNOSIS — E1151 Type 2 diabetes mellitus with diabetic peripheral angiopathy without gangrene: Secondary | ICD-10-CM | POA: Diagnosis not present

## 2016-07-31 DIAGNOSIS — F29 Unspecified psychosis not due to a substance or known physiological condition: Secondary | ICD-10-CM | POA: Diagnosis not present

## 2016-07-31 DIAGNOSIS — F339 Major depressive disorder, recurrent, unspecified: Secondary | ICD-10-CM | POA: Diagnosis not present

## 2016-07-31 DIAGNOSIS — F0391 Unspecified dementia with behavioral disturbance: Secondary | ICD-10-CM | POA: Diagnosis not present

## 2016-08-07 DIAGNOSIS — R05 Cough: Secondary | ICD-10-CM | POA: Diagnosis not present

## 2016-08-07 DIAGNOSIS — R509 Fever, unspecified: Secondary | ICD-10-CM | POA: Diagnosis not present

## 2016-08-07 DIAGNOSIS — R093 Abnormal sputum: Secondary | ICD-10-CM | POA: Diagnosis not present

## 2016-08-07 DIAGNOSIS — R5081 Fever presenting with conditions classified elsewhere: Secondary | ICD-10-CM | POA: Diagnosis not present

## 2016-08-07 DIAGNOSIS — R0682 Tachypnea, not elsewhere classified: Secondary | ICD-10-CM | POA: Diagnosis not present

## 2016-08-08 DIAGNOSIS — F0390 Unspecified dementia without behavioral disturbance: Secondary | ICD-10-CM | POA: Diagnosis not present

## 2016-08-08 DIAGNOSIS — B349 Viral infection, unspecified: Secondary | ICD-10-CM | POA: Diagnosis not present

## 2016-08-08 DIAGNOSIS — R05 Cough: Secondary | ICD-10-CM | POA: Diagnosis not present

## 2016-09-02 DIAGNOSIS — D490 Neoplasm of unspecified behavior of digestive system: Secondary | ICD-10-CM | POA: Diagnosis not present

## 2016-09-02 DIAGNOSIS — I2510 Atherosclerotic heart disease of native coronary artery without angina pectoris: Secondary | ICD-10-CM | POA: Diagnosis not present

## 2016-09-02 DIAGNOSIS — E785 Hyperlipidemia, unspecified: Secondary | ICD-10-CM | POA: Diagnosis not present

## 2016-09-02 DIAGNOSIS — F39 Unspecified mood [affective] disorder: Secondary | ICD-10-CM | POA: Diagnosis not present

## 2016-09-11 DIAGNOSIS — F29 Unspecified psychosis not due to a substance or known physiological condition: Secondary | ICD-10-CM | POA: Diagnosis not present

## 2016-09-11 DIAGNOSIS — F339 Major depressive disorder, recurrent, unspecified: Secondary | ICD-10-CM | POA: Diagnosis not present

## 2016-09-11 DIAGNOSIS — F0391 Unspecified dementia with behavioral disturbance: Secondary | ICD-10-CM | POA: Diagnosis not present

## 2016-09-30 DIAGNOSIS — Z8673 Personal history of transient ischemic attack (TIA), and cerebral infarction without residual deficits: Secondary | ICD-10-CM | POA: Diagnosis not present

## 2016-09-30 DIAGNOSIS — F0390 Unspecified dementia without behavioral disturbance: Secondary | ICD-10-CM | POA: Diagnosis not present

## 2016-09-30 DIAGNOSIS — I2510 Atherosclerotic heart disease of native coronary artery without angina pectoris: Secondary | ICD-10-CM | POA: Diagnosis not present

## 2016-09-30 DIAGNOSIS — E785 Hyperlipidemia, unspecified: Secondary | ICD-10-CM | POA: Diagnosis not present

## 2016-10-05 DIAGNOSIS — E1140 Type 2 diabetes mellitus with diabetic neuropathy, unspecified: Secondary | ICD-10-CM | POA: Diagnosis not present

## 2016-10-05 DIAGNOSIS — E1151 Type 2 diabetes mellitus with diabetic peripheral angiopathy without gangrene: Secondary | ICD-10-CM | POA: Diagnosis not present

## 2016-10-06 DIAGNOSIS — E119 Type 2 diabetes mellitus without complications: Secondary | ICD-10-CM | POA: Diagnosis not present

## 2016-10-06 DIAGNOSIS — Z8673 Personal history of transient ischemic attack (TIA), and cerebral infarction without residual deficits: Secondary | ICD-10-CM | POA: Diagnosis not present

## 2016-10-06 DIAGNOSIS — R2681 Unsteadiness on feet: Secondary | ICD-10-CM | POA: Diagnosis not present

## 2016-10-07 DIAGNOSIS — R4182 Altered mental status, unspecified: Secondary | ICD-10-CM | POA: Diagnosis not present

## 2016-10-07 DIAGNOSIS — E559 Vitamin D deficiency, unspecified: Secondary | ICD-10-CM | POA: Diagnosis not present

## 2016-10-07 DIAGNOSIS — E785 Hyperlipidemia, unspecified: Secondary | ICD-10-CM | POA: Diagnosis not present

## 2016-10-07 DIAGNOSIS — Z8673 Personal history of transient ischemic attack (TIA), and cerebral infarction without residual deficits: Secondary | ICD-10-CM | POA: Diagnosis not present

## 2016-10-07 DIAGNOSIS — R2681 Unsteadiness on feet: Secondary | ICD-10-CM | POA: Diagnosis not present

## 2016-10-07 DIAGNOSIS — E039 Hypothyroidism, unspecified: Secondary | ICD-10-CM | POA: Diagnosis not present

## 2016-10-07 DIAGNOSIS — E119 Type 2 diabetes mellitus without complications: Secondary | ICD-10-CM | POA: Diagnosis not present

## 2016-10-07 DIAGNOSIS — D649 Anemia, unspecified: Secondary | ICD-10-CM | POA: Diagnosis not present

## 2016-10-08 DIAGNOSIS — Z8673 Personal history of transient ischemic attack (TIA), and cerebral infarction without residual deficits: Secondary | ICD-10-CM | POA: Diagnosis not present

## 2016-10-08 DIAGNOSIS — E119 Type 2 diabetes mellitus without complications: Secondary | ICD-10-CM | POA: Diagnosis not present

## 2016-10-08 DIAGNOSIS — R2681 Unsteadiness on feet: Secondary | ICD-10-CM | POA: Diagnosis not present

## 2016-10-11 DIAGNOSIS — R2681 Unsteadiness on feet: Secondary | ICD-10-CM | POA: Diagnosis not present

## 2016-10-11 DIAGNOSIS — Z8673 Personal history of transient ischemic attack (TIA), and cerebral infarction without residual deficits: Secondary | ICD-10-CM | POA: Diagnosis not present

## 2016-10-11 DIAGNOSIS — E119 Type 2 diabetes mellitus without complications: Secondary | ICD-10-CM | POA: Diagnosis not present

## 2016-10-12 DIAGNOSIS — E119 Type 2 diabetes mellitus without complications: Secondary | ICD-10-CM | POA: Diagnosis not present

## 2016-10-12 DIAGNOSIS — Z8673 Personal history of transient ischemic attack (TIA), and cerebral infarction without residual deficits: Secondary | ICD-10-CM | POA: Diagnosis not present

## 2016-10-12 DIAGNOSIS — R2681 Unsteadiness on feet: Secondary | ICD-10-CM | POA: Diagnosis not present

## 2016-10-13 DIAGNOSIS — Z8673 Personal history of transient ischemic attack (TIA), and cerebral infarction without residual deficits: Secondary | ICD-10-CM | POA: Diagnosis not present

## 2016-10-13 DIAGNOSIS — E119 Type 2 diabetes mellitus without complications: Secondary | ICD-10-CM | POA: Diagnosis not present

## 2016-10-13 DIAGNOSIS — R2681 Unsteadiness on feet: Secondary | ICD-10-CM | POA: Diagnosis not present

## 2016-10-14 DIAGNOSIS — Z8673 Personal history of transient ischemic attack (TIA), and cerebral infarction without residual deficits: Secondary | ICD-10-CM | POA: Diagnosis not present

## 2016-10-14 DIAGNOSIS — R2681 Unsteadiness on feet: Secondary | ICD-10-CM | POA: Diagnosis not present

## 2016-10-14 DIAGNOSIS — E119 Type 2 diabetes mellitus without complications: Secondary | ICD-10-CM | POA: Diagnosis not present

## 2016-10-15 DIAGNOSIS — R2681 Unsteadiness on feet: Secondary | ICD-10-CM | POA: Diagnosis not present

## 2016-10-15 DIAGNOSIS — Z8673 Personal history of transient ischemic attack (TIA), and cerebral infarction without residual deficits: Secondary | ICD-10-CM | POA: Diagnosis not present

## 2016-10-15 DIAGNOSIS — E119 Type 2 diabetes mellitus without complications: Secondary | ICD-10-CM | POA: Diagnosis not present

## 2016-10-18 DIAGNOSIS — R2681 Unsteadiness on feet: Secondary | ICD-10-CM | POA: Diagnosis not present

## 2016-10-18 DIAGNOSIS — E119 Type 2 diabetes mellitus without complications: Secondary | ICD-10-CM | POA: Diagnosis not present

## 2016-10-18 DIAGNOSIS — Z8673 Personal history of transient ischemic attack (TIA), and cerebral infarction without residual deficits: Secondary | ICD-10-CM | POA: Diagnosis not present

## 2016-10-19 DIAGNOSIS — E119 Type 2 diabetes mellitus without complications: Secondary | ICD-10-CM | POA: Diagnosis not present

## 2016-10-19 DIAGNOSIS — Z8673 Personal history of transient ischemic attack (TIA), and cerebral infarction without residual deficits: Secondary | ICD-10-CM | POA: Diagnosis not present

## 2016-10-19 DIAGNOSIS — R2681 Unsteadiness on feet: Secondary | ICD-10-CM | POA: Diagnosis not present

## 2016-10-20 DIAGNOSIS — E119 Type 2 diabetes mellitus without complications: Secondary | ICD-10-CM | POA: Diagnosis not present

## 2016-10-20 DIAGNOSIS — Z8673 Personal history of transient ischemic attack (TIA), and cerebral infarction without residual deficits: Secondary | ICD-10-CM | POA: Diagnosis not present

## 2016-10-20 DIAGNOSIS — R2681 Unsteadiness on feet: Secondary | ICD-10-CM | POA: Diagnosis not present

## 2016-10-23 DIAGNOSIS — R2681 Unsteadiness on feet: Secondary | ICD-10-CM | POA: Diagnosis not present

## 2016-10-23 DIAGNOSIS — E119 Type 2 diabetes mellitus without complications: Secondary | ICD-10-CM | POA: Diagnosis not present

## 2016-10-23 DIAGNOSIS — Z8673 Personal history of transient ischemic attack (TIA), and cerebral infarction without residual deficits: Secondary | ICD-10-CM | POA: Diagnosis not present

## 2016-10-25 DIAGNOSIS — R2681 Unsteadiness on feet: Secondary | ICD-10-CM | POA: Diagnosis not present

## 2016-10-25 DIAGNOSIS — Z8673 Personal history of transient ischemic attack (TIA), and cerebral infarction without residual deficits: Secondary | ICD-10-CM | POA: Diagnosis not present

## 2016-10-25 DIAGNOSIS — E119 Type 2 diabetes mellitus without complications: Secondary | ICD-10-CM | POA: Diagnosis not present

## 2016-10-26 DIAGNOSIS — E119 Type 2 diabetes mellitus without complications: Secondary | ICD-10-CM | POA: Diagnosis not present

## 2016-10-26 DIAGNOSIS — R2681 Unsteadiness on feet: Secondary | ICD-10-CM | POA: Diagnosis not present

## 2016-10-26 DIAGNOSIS — Z8673 Personal history of transient ischemic attack (TIA), and cerebral infarction without residual deficits: Secondary | ICD-10-CM | POA: Diagnosis not present

## 2016-10-27 DIAGNOSIS — Z8673 Personal history of transient ischemic attack (TIA), and cerebral infarction without residual deficits: Secondary | ICD-10-CM | POA: Diagnosis not present

## 2016-10-27 DIAGNOSIS — R2681 Unsteadiness on feet: Secondary | ICD-10-CM | POA: Diagnosis not present

## 2016-10-27 DIAGNOSIS — E119 Type 2 diabetes mellitus without complications: Secondary | ICD-10-CM | POA: Diagnosis not present

## 2016-10-29 DIAGNOSIS — E119 Type 2 diabetes mellitus without complications: Secondary | ICD-10-CM | POA: Diagnosis not present

## 2016-10-29 DIAGNOSIS — R2681 Unsteadiness on feet: Secondary | ICD-10-CM | POA: Diagnosis not present

## 2016-10-29 DIAGNOSIS — Z8673 Personal history of transient ischemic attack (TIA), and cerebral infarction without residual deficits: Secondary | ICD-10-CM | POA: Diagnosis not present

## 2016-11-01 DIAGNOSIS — H109 Unspecified conjunctivitis: Secondary | ICD-10-CM | POA: Diagnosis not present

## 2016-11-01 DIAGNOSIS — R2681 Unsteadiness on feet: Secondary | ICD-10-CM | POA: Diagnosis not present

## 2016-11-01 DIAGNOSIS — R05 Cough: Secondary | ICD-10-CM | POA: Diagnosis not present

## 2016-11-01 DIAGNOSIS — R062 Wheezing: Secondary | ICD-10-CM | POA: Diagnosis not present

## 2016-11-01 DIAGNOSIS — Z8673 Personal history of transient ischemic attack (TIA), and cerebral infarction without residual deficits: Secondary | ICD-10-CM | POA: Diagnosis not present

## 2016-11-01 DIAGNOSIS — E119 Type 2 diabetes mellitus without complications: Secondary | ICD-10-CM | POA: Diagnosis not present

## 2016-11-01 DIAGNOSIS — R531 Weakness: Secondary | ICD-10-CM | POA: Diagnosis not present

## 2016-11-02 DIAGNOSIS — D649 Anemia, unspecified: Secondary | ICD-10-CM | POA: Diagnosis not present

## 2016-11-02 DIAGNOSIS — R2681 Unsteadiness on feet: Secondary | ICD-10-CM | POA: Diagnosis not present

## 2016-11-02 DIAGNOSIS — E119 Type 2 diabetes mellitus without complications: Secondary | ICD-10-CM | POA: Diagnosis not present

## 2016-11-02 DIAGNOSIS — R918 Other nonspecific abnormal finding of lung field: Secondary | ICD-10-CM | POA: Diagnosis not present

## 2016-11-02 DIAGNOSIS — Z8673 Personal history of transient ischemic attack (TIA), and cerebral infarction without residual deficits: Secondary | ICD-10-CM | POA: Diagnosis not present

## 2016-11-02 DIAGNOSIS — R05 Cough: Secondary | ICD-10-CM | POA: Diagnosis not present

## 2016-11-03 DIAGNOSIS — E119 Type 2 diabetes mellitus without complications: Secondary | ICD-10-CM | POA: Diagnosis not present

## 2016-11-03 DIAGNOSIS — R2681 Unsteadiness on feet: Secondary | ICD-10-CM | POA: Diagnosis not present

## 2016-11-03 DIAGNOSIS — Z8673 Personal history of transient ischemic attack (TIA), and cerebral infarction without residual deficits: Secondary | ICD-10-CM | POA: Diagnosis not present

## 2016-11-04 DIAGNOSIS — H019 Unspecified inflammation of eyelid: Secondary | ICD-10-CM | POA: Diagnosis not present

## 2016-11-04 DIAGNOSIS — R2681 Unsteadiness on feet: Secondary | ICD-10-CM | POA: Diagnosis not present

## 2016-11-04 DIAGNOSIS — Z8673 Personal history of transient ischemic attack (TIA), and cerebral infarction without residual deficits: Secondary | ICD-10-CM | POA: Diagnosis not present

## 2016-11-04 DIAGNOSIS — E119 Type 2 diabetes mellitus without complications: Secondary | ICD-10-CM | POA: Diagnosis not present

## 2016-11-04 DIAGNOSIS — R531 Weakness: Secondary | ICD-10-CM | POA: Diagnosis not present

## 2016-11-04 DIAGNOSIS — R05 Cough: Secondary | ICD-10-CM | POA: Diagnosis not present

## 2016-11-04 DIAGNOSIS — R062 Wheezing: Secondary | ICD-10-CM | POA: Diagnosis not present

## 2016-11-05 DIAGNOSIS — Z8673 Personal history of transient ischemic attack (TIA), and cerebral infarction without residual deficits: Secondary | ICD-10-CM | POA: Diagnosis not present

## 2016-11-05 DIAGNOSIS — E119 Type 2 diabetes mellitus without complications: Secondary | ICD-10-CM | POA: Diagnosis not present

## 2016-11-05 DIAGNOSIS — R2681 Unsteadiness on feet: Secondary | ICD-10-CM | POA: Diagnosis not present

## 2016-11-08 DIAGNOSIS — Z8673 Personal history of transient ischemic attack (TIA), and cerebral infarction without residual deficits: Secondary | ICD-10-CM | POA: Diagnosis not present

## 2016-11-08 DIAGNOSIS — R2681 Unsteadiness on feet: Secondary | ICD-10-CM | POA: Diagnosis not present

## 2016-11-08 DIAGNOSIS — E119 Type 2 diabetes mellitus without complications: Secondary | ICD-10-CM | POA: Diagnosis not present

## 2016-11-09 DIAGNOSIS — E119 Type 2 diabetes mellitus without complications: Secondary | ICD-10-CM | POA: Diagnosis not present

## 2016-11-09 DIAGNOSIS — Z8673 Personal history of transient ischemic attack (TIA), and cerebral infarction without residual deficits: Secondary | ICD-10-CM | POA: Diagnosis not present

## 2016-11-09 DIAGNOSIS — R2681 Unsteadiness on feet: Secondary | ICD-10-CM | POA: Diagnosis not present

## 2016-11-10 DIAGNOSIS — E119 Type 2 diabetes mellitus without complications: Secondary | ICD-10-CM | POA: Diagnosis not present

## 2016-11-10 DIAGNOSIS — Z8673 Personal history of transient ischemic attack (TIA), and cerebral infarction without residual deficits: Secondary | ICD-10-CM | POA: Diagnosis not present

## 2016-11-10 DIAGNOSIS — R2681 Unsteadiness on feet: Secondary | ICD-10-CM | POA: Diagnosis not present

## 2016-11-11 DIAGNOSIS — E119 Type 2 diabetes mellitus without complications: Secondary | ICD-10-CM | POA: Diagnosis not present

## 2016-11-11 DIAGNOSIS — Z8673 Personal history of transient ischemic attack (TIA), and cerebral infarction without residual deficits: Secondary | ICD-10-CM | POA: Diagnosis not present

## 2016-11-11 DIAGNOSIS — R2681 Unsteadiness on feet: Secondary | ICD-10-CM | POA: Diagnosis not present

## 2016-11-12 DIAGNOSIS — E119 Type 2 diabetes mellitus without complications: Secondary | ICD-10-CM | POA: Diagnosis not present

## 2016-11-12 DIAGNOSIS — Z8673 Personal history of transient ischemic attack (TIA), and cerebral infarction without residual deficits: Secondary | ICD-10-CM | POA: Diagnosis not present

## 2016-11-12 DIAGNOSIS — R2681 Unsteadiness on feet: Secondary | ICD-10-CM | POA: Diagnosis not present

## 2016-11-15 DIAGNOSIS — Z8673 Personal history of transient ischemic attack (TIA), and cerebral infarction without residual deficits: Secondary | ICD-10-CM | POA: Diagnosis not present

## 2016-11-15 DIAGNOSIS — E119 Type 2 diabetes mellitus without complications: Secondary | ICD-10-CM | POA: Diagnosis not present

## 2016-11-15 DIAGNOSIS — R2681 Unsteadiness on feet: Secondary | ICD-10-CM | POA: Diagnosis not present

## 2016-11-16 DIAGNOSIS — R2681 Unsteadiness on feet: Secondary | ICD-10-CM | POA: Diagnosis not present

## 2016-11-16 DIAGNOSIS — Z8673 Personal history of transient ischemic attack (TIA), and cerebral infarction without residual deficits: Secondary | ICD-10-CM | POA: Diagnosis not present

## 2016-11-16 DIAGNOSIS — E119 Type 2 diabetes mellitus without complications: Secondary | ICD-10-CM | POA: Diagnosis not present

## 2016-11-17 DIAGNOSIS — E119 Type 2 diabetes mellitus without complications: Secondary | ICD-10-CM | POA: Diagnosis not present

## 2016-11-17 DIAGNOSIS — Z8673 Personal history of transient ischemic attack (TIA), and cerebral infarction without residual deficits: Secondary | ICD-10-CM | POA: Diagnosis not present

## 2016-11-17 DIAGNOSIS — R2681 Unsteadiness on feet: Secondary | ICD-10-CM | POA: Diagnosis not present

## 2016-11-18 DIAGNOSIS — E119 Type 2 diabetes mellitus without complications: Secondary | ICD-10-CM | POA: Diagnosis not present

## 2016-11-18 DIAGNOSIS — Z8673 Personal history of transient ischemic attack (TIA), and cerebral infarction without residual deficits: Secondary | ICD-10-CM | POA: Diagnosis not present

## 2016-11-18 DIAGNOSIS — R2681 Unsteadiness on feet: Secondary | ICD-10-CM | POA: Diagnosis not present

## 2016-11-19 DIAGNOSIS — E119 Type 2 diabetes mellitus without complications: Secondary | ICD-10-CM | POA: Diagnosis not present

## 2016-11-19 DIAGNOSIS — R2681 Unsteadiness on feet: Secondary | ICD-10-CM | POA: Diagnosis not present

## 2016-11-19 DIAGNOSIS — Z8673 Personal history of transient ischemic attack (TIA), and cerebral infarction without residual deficits: Secondary | ICD-10-CM | POA: Diagnosis not present

## 2016-11-22 DIAGNOSIS — R2681 Unsteadiness on feet: Secondary | ICD-10-CM | POA: Diagnosis not present

## 2016-11-22 DIAGNOSIS — E119 Type 2 diabetes mellitus without complications: Secondary | ICD-10-CM | POA: Diagnosis not present

## 2016-11-22 DIAGNOSIS — Z8673 Personal history of transient ischemic attack (TIA), and cerebral infarction without residual deficits: Secondary | ICD-10-CM | POA: Diagnosis not present

## 2016-11-23 DIAGNOSIS — Z8673 Personal history of transient ischemic attack (TIA), and cerebral infarction without residual deficits: Secondary | ICD-10-CM | POA: Diagnosis not present

## 2016-11-23 DIAGNOSIS — R2681 Unsteadiness on feet: Secondary | ICD-10-CM | POA: Diagnosis not present

## 2016-11-23 DIAGNOSIS — E119 Type 2 diabetes mellitus without complications: Secondary | ICD-10-CM | POA: Diagnosis not present

## 2016-11-24 DIAGNOSIS — E119 Type 2 diabetes mellitus without complications: Secondary | ICD-10-CM | POA: Diagnosis not present

## 2016-11-24 DIAGNOSIS — R2681 Unsteadiness on feet: Secondary | ICD-10-CM | POA: Diagnosis not present

## 2016-11-24 DIAGNOSIS — Z8673 Personal history of transient ischemic attack (TIA), and cerebral infarction without residual deficits: Secondary | ICD-10-CM | POA: Diagnosis not present

## 2016-11-25 DIAGNOSIS — R531 Weakness: Secondary | ICD-10-CM | POA: Diagnosis not present

## 2016-11-25 DIAGNOSIS — R1310 Dysphagia, unspecified: Secondary | ICD-10-CM | POA: Diagnosis not present

## 2016-11-25 DIAGNOSIS — R2681 Unsteadiness on feet: Secondary | ICD-10-CM | POA: Diagnosis not present

## 2016-11-25 DIAGNOSIS — E119 Type 2 diabetes mellitus without complications: Secondary | ICD-10-CM | POA: Diagnosis not present

## 2016-11-25 DIAGNOSIS — F0390 Unspecified dementia without behavioral disturbance: Secondary | ICD-10-CM | POA: Diagnosis not present

## 2016-11-25 DIAGNOSIS — H109 Unspecified conjunctivitis: Secondary | ICD-10-CM | POA: Diagnosis not present

## 2016-11-25 DIAGNOSIS — Z8673 Personal history of transient ischemic attack (TIA), and cerebral infarction without residual deficits: Secondary | ICD-10-CM | POA: Diagnosis not present

## 2016-11-26 DIAGNOSIS — Z8673 Personal history of transient ischemic attack (TIA), and cerebral infarction without residual deficits: Secondary | ICD-10-CM | POA: Diagnosis not present

## 2016-11-26 DIAGNOSIS — R2681 Unsteadiness on feet: Secondary | ICD-10-CM | POA: Diagnosis not present

## 2016-11-26 DIAGNOSIS — E119 Type 2 diabetes mellitus without complications: Secondary | ICD-10-CM | POA: Diagnosis not present

## 2016-11-27 DIAGNOSIS — R2681 Unsteadiness on feet: Secondary | ICD-10-CM | POA: Diagnosis not present

## 2016-11-27 DIAGNOSIS — E119 Type 2 diabetes mellitus without complications: Secondary | ICD-10-CM | POA: Diagnosis not present

## 2016-11-27 DIAGNOSIS — Z8673 Personal history of transient ischemic attack (TIA), and cerebral infarction without residual deficits: Secondary | ICD-10-CM | POA: Diagnosis not present

## 2016-11-28 DIAGNOSIS — E119 Type 2 diabetes mellitus without complications: Secondary | ICD-10-CM | POA: Diagnosis not present

## 2016-11-28 DIAGNOSIS — R2681 Unsteadiness on feet: Secondary | ICD-10-CM | POA: Diagnosis not present

## 2016-11-28 DIAGNOSIS — Z8673 Personal history of transient ischemic attack (TIA), and cerebral infarction without residual deficits: Secondary | ICD-10-CM | POA: Diagnosis not present

## 2016-11-29 DIAGNOSIS — R2681 Unsteadiness on feet: Secondary | ICD-10-CM | POA: Diagnosis not present

## 2016-11-29 DIAGNOSIS — Z8673 Personal history of transient ischemic attack (TIA), and cerebral infarction without residual deficits: Secondary | ICD-10-CM | POA: Diagnosis not present

## 2016-11-29 DIAGNOSIS — E119 Type 2 diabetes mellitus without complications: Secondary | ICD-10-CM | POA: Diagnosis not present

## 2016-11-30 DIAGNOSIS — R2681 Unsteadiness on feet: Secondary | ICD-10-CM | POA: Diagnosis not present

## 2016-11-30 DIAGNOSIS — E119 Type 2 diabetes mellitus without complications: Secondary | ICD-10-CM | POA: Diagnosis not present

## 2016-11-30 DIAGNOSIS — Z8673 Personal history of transient ischemic attack (TIA), and cerebral infarction without residual deficits: Secondary | ICD-10-CM | POA: Diagnosis not present

## 2016-12-01 DIAGNOSIS — Z8673 Personal history of transient ischemic attack (TIA), and cerebral infarction without residual deficits: Secondary | ICD-10-CM | POA: Diagnosis not present

## 2016-12-01 DIAGNOSIS — E119 Type 2 diabetes mellitus without complications: Secondary | ICD-10-CM | POA: Diagnosis not present

## 2016-12-01 DIAGNOSIS — R2681 Unsteadiness on feet: Secondary | ICD-10-CM | POA: Diagnosis not present

## 2016-12-02 DIAGNOSIS — E119 Type 2 diabetes mellitus without complications: Secondary | ICD-10-CM | POA: Diagnosis not present

## 2016-12-02 DIAGNOSIS — R2681 Unsteadiness on feet: Secondary | ICD-10-CM | POA: Diagnosis not present

## 2016-12-02 DIAGNOSIS — Z8673 Personal history of transient ischemic attack (TIA), and cerebral infarction without residual deficits: Secondary | ICD-10-CM | POA: Diagnosis not present

## 2016-12-03 DIAGNOSIS — E119 Type 2 diabetes mellitus without complications: Secondary | ICD-10-CM | POA: Diagnosis not present

## 2016-12-03 DIAGNOSIS — Z8673 Personal history of transient ischemic attack (TIA), and cerebral infarction without residual deficits: Secondary | ICD-10-CM | POA: Diagnosis not present

## 2016-12-03 DIAGNOSIS — R2681 Unsteadiness on feet: Secondary | ICD-10-CM | POA: Diagnosis not present

## 2016-12-05 DIAGNOSIS — Z8673 Personal history of transient ischemic attack (TIA), and cerebral infarction without residual deficits: Secondary | ICD-10-CM | POA: Diagnosis not present

## 2016-12-05 DIAGNOSIS — E119 Type 2 diabetes mellitus without complications: Secondary | ICD-10-CM | POA: Diagnosis not present

## 2016-12-05 DIAGNOSIS — R2681 Unsteadiness on feet: Secondary | ICD-10-CM | POA: Diagnosis not present

## 2016-12-06 DIAGNOSIS — R2681 Unsteadiness on feet: Secondary | ICD-10-CM | POA: Diagnosis not present

## 2016-12-06 DIAGNOSIS — Z8673 Personal history of transient ischemic attack (TIA), and cerebral infarction without residual deficits: Secondary | ICD-10-CM | POA: Diagnosis not present

## 2016-12-06 DIAGNOSIS — H109 Unspecified conjunctivitis: Secondary | ICD-10-CM | POA: Diagnosis not present

## 2016-12-06 DIAGNOSIS — F0390 Unspecified dementia without behavioral disturbance: Secondary | ICD-10-CM | POA: Diagnosis not present

## 2016-12-06 DIAGNOSIS — E119 Type 2 diabetes mellitus without complications: Secondary | ICD-10-CM | POA: Diagnosis not present

## 2016-12-07 DIAGNOSIS — Z8673 Personal history of transient ischemic attack (TIA), and cerebral infarction without residual deficits: Secondary | ICD-10-CM | POA: Diagnosis not present

## 2016-12-07 DIAGNOSIS — E119 Type 2 diabetes mellitus without complications: Secondary | ICD-10-CM | POA: Diagnosis not present

## 2016-12-07 DIAGNOSIS — R2681 Unsteadiness on feet: Secondary | ICD-10-CM | POA: Diagnosis not present

## 2016-12-08 DIAGNOSIS — R2681 Unsteadiness on feet: Secondary | ICD-10-CM | POA: Diagnosis not present

## 2016-12-08 DIAGNOSIS — E119 Type 2 diabetes mellitus without complications: Secondary | ICD-10-CM | POA: Diagnosis not present

## 2016-12-08 DIAGNOSIS — Z8673 Personal history of transient ischemic attack (TIA), and cerebral infarction without residual deficits: Secondary | ICD-10-CM | POA: Diagnosis not present

## 2016-12-09 DIAGNOSIS — R2681 Unsteadiness on feet: Secondary | ICD-10-CM | POA: Diagnosis not present

## 2016-12-09 DIAGNOSIS — H109 Unspecified conjunctivitis: Secondary | ICD-10-CM | POA: Diagnosis not present

## 2016-12-09 DIAGNOSIS — F0390 Unspecified dementia without behavioral disturbance: Secondary | ICD-10-CM | POA: Diagnosis not present

## 2016-12-09 DIAGNOSIS — E119 Type 2 diabetes mellitus without complications: Secondary | ICD-10-CM | POA: Diagnosis not present

## 2016-12-09 DIAGNOSIS — Z8673 Personal history of transient ischemic attack (TIA), and cerebral infarction without residual deficits: Secondary | ICD-10-CM | POA: Diagnosis not present

## 2016-12-10 DIAGNOSIS — R2681 Unsteadiness on feet: Secondary | ICD-10-CM | POA: Diagnosis not present

## 2016-12-10 DIAGNOSIS — E119 Type 2 diabetes mellitus without complications: Secondary | ICD-10-CM | POA: Diagnosis not present

## 2016-12-10 DIAGNOSIS — Z8673 Personal history of transient ischemic attack (TIA), and cerebral infarction without residual deficits: Secondary | ICD-10-CM | POA: Diagnosis not present

## 2016-12-16 DIAGNOSIS — A048 Other specified bacterial intestinal infections: Secondary | ICD-10-CM | POA: Diagnosis not present

## 2016-12-16 DIAGNOSIS — Z79899 Other long term (current) drug therapy: Secondary | ICD-10-CM | POA: Diagnosis not present

## 2016-12-16 DIAGNOSIS — Z789 Other specified health status: Secondary | ICD-10-CM | POA: Diagnosis not present

## 2016-12-19 DIAGNOSIS — H109 Unspecified conjunctivitis: Secondary | ICD-10-CM | POA: Diagnosis not present

## 2016-12-19 DIAGNOSIS — F0390 Unspecified dementia without behavioral disturbance: Secondary | ICD-10-CM | POA: Diagnosis not present

## 2016-12-27 DIAGNOSIS — H109 Unspecified conjunctivitis: Secondary | ICD-10-CM | POA: Diagnosis not present

## 2016-12-27 DIAGNOSIS — F419 Anxiety disorder, unspecified: Secondary | ICD-10-CM | POA: Diagnosis not present

## 2016-12-27 DIAGNOSIS — F39 Unspecified mood [affective] disorder: Secondary | ICD-10-CM | POA: Diagnosis not present

## 2017-01-02 DIAGNOSIS — H1045 Other chronic allergic conjunctivitis: Secondary | ICD-10-CM | POA: Diagnosis not present

## 2017-01-02 DIAGNOSIS — S8992XA Unspecified injury of left lower leg, initial encounter: Secondary | ICD-10-CM | POA: Diagnosis not present

## 2017-01-02 DIAGNOSIS — R638 Other symptoms and signs concerning food and fluid intake: Secondary | ICD-10-CM | POA: Diagnosis not present

## 2017-01-02 DIAGNOSIS — R011 Cardiac murmur, unspecified: Secondary | ICD-10-CM | POA: Diagnosis not present

## 2017-01-06 DIAGNOSIS — E785 Hyperlipidemia, unspecified: Secondary | ICD-10-CM | POA: Diagnosis not present

## 2017-01-06 DIAGNOSIS — B351 Tinea unguium: Secondary | ICD-10-CM | POA: Diagnosis not present

## 2017-01-06 DIAGNOSIS — E039 Hypothyroidism, unspecified: Secondary | ICD-10-CM | POA: Diagnosis not present

## 2017-01-06 DIAGNOSIS — M79674 Pain in right toe(s): Secondary | ICD-10-CM | POA: Diagnosis not present

## 2017-01-06 DIAGNOSIS — M79675 Pain in left toe(s): Secondary | ICD-10-CM | POA: Diagnosis not present

## 2017-01-06 DIAGNOSIS — I10 Essential (primary) hypertension: Secondary | ICD-10-CM | POA: Diagnosis not present

## 2017-01-06 DIAGNOSIS — F419 Anxiety disorder, unspecified: Secondary | ICD-10-CM | POA: Diagnosis not present

## 2017-01-06 DIAGNOSIS — F329 Major depressive disorder, single episode, unspecified: Secondary | ICD-10-CM | POA: Diagnosis not present

## 2017-01-06 DIAGNOSIS — E119 Type 2 diabetes mellitus without complications: Secondary | ICD-10-CM | POA: Diagnosis not present

## 2017-01-06 DIAGNOSIS — H1045 Other chronic allergic conjunctivitis: Secondary | ICD-10-CM | POA: Diagnosis not present

## 2017-01-06 DIAGNOSIS — F39 Unspecified mood [affective] disorder: Secondary | ICD-10-CM | POA: Diagnosis not present

## 2017-01-11 DIAGNOSIS — E1140 Type 2 diabetes mellitus with diabetic neuropathy, unspecified: Secondary | ICD-10-CM | POA: Diagnosis not present

## 2017-01-11 DIAGNOSIS — E1151 Type 2 diabetes mellitus with diabetic peripheral angiopathy without gangrene: Secondary | ICD-10-CM | POA: Diagnosis not present

## 2017-02-03 DIAGNOSIS — F39 Unspecified mood [affective] disorder: Secondary | ICD-10-CM | POA: Diagnosis not present

## 2017-02-03 DIAGNOSIS — F322 Major depressive disorder, single episode, severe without psychotic features: Secondary | ICD-10-CM | POA: Diagnosis not present

## 2017-02-03 DIAGNOSIS — F0390 Unspecified dementia without behavioral disturbance: Secondary | ICD-10-CM | POA: Diagnosis not present

## 2017-02-03 DIAGNOSIS — H1045 Other chronic allergic conjunctivitis: Secondary | ICD-10-CM | POA: Diagnosis not present

## 2017-02-07 DIAGNOSIS — Z79899 Other long term (current) drug therapy: Secondary | ICD-10-CM | POA: Diagnosis not present

## 2017-02-07 DIAGNOSIS — Z789 Other specified health status: Secondary | ICD-10-CM | POA: Diagnosis not present

## 2017-02-17 DIAGNOSIS — G8929 Other chronic pain: Secondary | ICD-10-CM | POA: Diagnosis not present

## 2017-02-17 DIAGNOSIS — E785 Hyperlipidemia, unspecified: Secondary | ICD-10-CM | POA: Diagnosis not present

## 2017-02-17 DIAGNOSIS — I2510 Atherosclerotic heart disease of native coronary artery without angina pectoris: Secondary | ICD-10-CM | POA: Diagnosis not present

## 2017-02-17 DIAGNOSIS — I10 Essential (primary) hypertension: Secondary | ICD-10-CM | POA: Diagnosis not present

## 2017-03-14 DIAGNOSIS — R1312 Dysphagia, oropharyngeal phase: Secondary | ICD-10-CM | POA: Diagnosis not present

## 2017-03-14 DIAGNOSIS — R2681 Unsteadiness on feet: Secondary | ICD-10-CM | POA: Diagnosis not present

## 2017-03-14 DIAGNOSIS — R41841 Cognitive communication deficit: Secondary | ICD-10-CM | POA: Diagnosis not present

## 2017-03-14 DIAGNOSIS — Z8673 Personal history of transient ischemic attack (TIA), and cerebral infarction without residual deficits: Secondary | ICD-10-CM | POA: Diagnosis not present

## 2017-03-14 DIAGNOSIS — E119 Type 2 diabetes mellitus without complications: Secondary | ICD-10-CM | POA: Diagnosis not present

## 2017-03-15 DIAGNOSIS — R1312 Dysphagia, oropharyngeal phase: Secondary | ICD-10-CM | POA: Diagnosis not present

## 2017-03-15 DIAGNOSIS — E119 Type 2 diabetes mellitus without complications: Secondary | ICD-10-CM | POA: Diagnosis not present

## 2017-03-15 DIAGNOSIS — R2681 Unsteadiness on feet: Secondary | ICD-10-CM | POA: Diagnosis not present

## 2017-03-15 DIAGNOSIS — Z8673 Personal history of transient ischemic attack (TIA), and cerebral infarction without residual deficits: Secondary | ICD-10-CM | POA: Diagnosis not present

## 2017-03-15 DIAGNOSIS — R41841 Cognitive communication deficit: Secondary | ICD-10-CM | POA: Diagnosis not present

## 2017-03-16 DIAGNOSIS — R41841 Cognitive communication deficit: Secondary | ICD-10-CM | POA: Diagnosis not present

## 2017-03-16 DIAGNOSIS — R2681 Unsteadiness on feet: Secondary | ICD-10-CM | POA: Diagnosis not present

## 2017-03-16 DIAGNOSIS — Z8673 Personal history of transient ischemic attack (TIA), and cerebral infarction without residual deficits: Secondary | ICD-10-CM | POA: Diagnosis not present

## 2017-03-16 DIAGNOSIS — E119 Type 2 diabetes mellitus without complications: Secondary | ICD-10-CM | POA: Diagnosis not present

## 2017-03-16 DIAGNOSIS — R1312 Dysphagia, oropharyngeal phase: Secondary | ICD-10-CM | POA: Diagnosis not present

## 2017-03-18 DIAGNOSIS — Z8673 Personal history of transient ischemic attack (TIA), and cerebral infarction without residual deficits: Secondary | ICD-10-CM | POA: Diagnosis not present

## 2017-03-18 DIAGNOSIS — R1312 Dysphagia, oropharyngeal phase: Secondary | ICD-10-CM | POA: Diagnosis not present

## 2017-03-18 DIAGNOSIS — R41841 Cognitive communication deficit: Secondary | ICD-10-CM | POA: Diagnosis not present

## 2017-03-18 DIAGNOSIS — R2681 Unsteadiness on feet: Secondary | ICD-10-CM | POA: Diagnosis not present

## 2017-03-18 DIAGNOSIS — E119 Type 2 diabetes mellitus without complications: Secondary | ICD-10-CM | POA: Diagnosis not present

## 2017-03-29 DIAGNOSIS — L84 Corns and callosities: Secondary | ICD-10-CM | POA: Diagnosis not present

## 2017-03-29 DIAGNOSIS — E1151 Type 2 diabetes mellitus with diabetic peripheral angiopathy without gangrene: Secondary | ICD-10-CM | POA: Diagnosis not present

## 2017-04-05 DIAGNOSIS — E1151 Type 2 diabetes mellitus with diabetic peripheral angiopathy without gangrene: Secondary | ICD-10-CM | POA: Diagnosis not present

## 2017-04-05 DIAGNOSIS — M79671 Pain in right foot: Secondary | ICD-10-CM | POA: Diagnosis not present

## 2017-04-05 DIAGNOSIS — E1140 Type 2 diabetes mellitus with diabetic neuropathy, unspecified: Secondary | ICD-10-CM | POA: Diagnosis not present

## 2017-04-05 DIAGNOSIS — M79672 Pain in left foot: Secondary | ICD-10-CM | POA: Diagnosis not present

## 2017-04-12 DIAGNOSIS — F329 Major depressive disorder, single episode, unspecified: Secondary | ICD-10-CM | POA: Diagnosis not present

## 2017-04-12 DIAGNOSIS — I10 Essential (primary) hypertension: Secondary | ICD-10-CM | POA: Diagnosis not present

## 2017-04-12 DIAGNOSIS — I2510 Atherosclerotic heart disease of native coronary artery without angina pectoris: Secondary | ICD-10-CM | POA: Diagnosis not present

## 2017-04-20 DIAGNOSIS — L209 Atopic dermatitis, unspecified: Secondary | ICD-10-CM | POA: Diagnosis not present

## 2017-05-09 DIAGNOSIS — Z5181 Encounter for therapeutic drug level monitoring: Secondary | ICD-10-CM | POA: Diagnosis not present

## 2017-05-09 DIAGNOSIS — E039 Hypothyroidism, unspecified: Secondary | ICD-10-CM | POA: Diagnosis not present

## 2017-05-09 DIAGNOSIS — E785 Hyperlipidemia, unspecified: Secondary | ICD-10-CM | POA: Diagnosis not present

## 2017-05-09 DIAGNOSIS — E119 Type 2 diabetes mellitus without complications: Secondary | ICD-10-CM | POA: Diagnosis not present

## 2017-05-09 DIAGNOSIS — D649 Anemia, unspecified: Secondary | ICD-10-CM | POA: Diagnosis not present

## 2017-05-09 DIAGNOSIS — E559 Vitamin D deficiency, unspecified: Secondary | ICD-10-CM | POA: Diagnosis not present

## 2017-05-16 DIAGNOSIS — Z7952 Long term (current) use of systemic steroids: Secondary | ICD-10-CM | POA: Diagnosis not present

## 2017-05-16 DIAGNOSIS — E119 Type 2 diabetes mellitus without complications: Secondary | ICD-10-CM | POA: Diagnosis not present

## 2017-05-16 DIAGNOSIS — H01009 Unspecified blepharitis unspecified eye, unspecified eyelid: Secondary | ICD-10-CM | POA: Diagnosis not present

## 2017-05-16 DIAGNOSIS — H353131 Nonexudative age-related macular degeneration, bilateral, early dry stage: Secondary | ICD-10-CM | POA: Diagnosis not present

## 2017-05-31 DIAGNOSIS — I10 Essential (primary) hypertension: Secondary | ICD-10-CM | POA: Diagnosis not present

## 2017-05-31 DIAGNOSIS — E785 Hyperlipidemia, unspecified: Secondary | ICD-10-CM | POA: Diagnosis not present

## 2017-05-31 DIAGNOSIS — F329 Major depressive disorder, single episode, unspecified: Secondary | ICD-10-CM | POA: Diagnosis not present

## 2017-05-31 DIAGNOSIS — I2510 Atherosclerotic heart disease of native coronary artery without angina pectoris: Secondary | ICD-10-CM | POA: Diagnosis not present

## 2017-06-16 DIAGNOSIS — E1151 Type 2 diabetes mellitus with diabetic peripheral angiopathy without gangrene: Secondary | ICD-10-CM | POA: Diagnosis not present

## 2017-06-28 DIAGNOSIS — E1140 Type 2 diabetes mellitus with diabetic neuropathy, unspecified: Secondary | ICD-10-CM | POA: Diagnosis not present

## 2017-06-28 DIAGNOSIS — E1151 Type 2 diabetes mellitus with diabetic peripheral angiopathy without gangrene: Secondary | ICD-10-CM | POA: Diagnosis not present

## 2017-06-29 DIAGNOSIS — E119 Type 2 diabetes mellitus without complications: Secondary | ICD-10-CM | POA: Diagnosis not present

## 2017-06-29 DIAGNOSIS — E559 Vitamin D deficiency, unspecified: Secondary | ICD-10-CM | POA: Diagnosis not present

## 2017-06-29 DIAGNOSIS — I10 Essential (primary) hypertension: Secondary | ICD-10-CM | POA: Diagnosis not present

## 2017-06-29 DIAGNOSIS — E039 Hypothyroidism, unspecified: Secondary | ICD-10-CM | POA: Diagnosis not present

## 2017-08-04 DIAGNOSIS — E039 Hypothyroidism, unspecified: Secondary | ICD-10-CM | POA: Diagnosis not present

## 2017-08-04 DIAGNOSIS — I10 Essential (primary) hypertension: Secondary | ICD-10-CM | POA: Diagnosis not present

## 2017-08-04 DIAGNOSIS — E119 Type 2 diabetes mellitus without complications: Secondary | ICD-10-CM | POA: Diagnosis not present

## 2017-08-04 DIAGNOSIS — E785 Hyperlipidemia, unspecified: Secondary | ICD-10-CM | POA: Diagnosis not present

## 2017-08-09 DIAGNOSIS — E559 Vitamin D deficiency, unspecified: Secondary | ICD-10-CM | POA: Diagnosis not present

## 2017-08-09 DIAGNOSIS — D649 Anemia, unspecified: Secondary | ICD-10-CM | POA: Diagnosis not present

## 2017-08-09 DIAGNOSIS — E119 Type 2 diabetes mellitus without complications: Secondary | ICD-10-CM | POA: Diagnosis not present

## 2017-08-09 DIAGNOSIS — Z5181 Encounter for therapeutic drug level monitoring: Secondary | ICD-10-CM | POA: Diagnosis not present

## 2017-08-09 DIAGNOSIS — E039 Hypothyroidism, unspecified: Secondary | ICD-10-CM | POA: Diagnosis not present

## 2017-08-11 DIAGNOSIS — Z5181 Encounter for therapeutic drug level monitoring: Secondary | ICD-10-CM | POA: Diagnosis not present

## 2017-08-11 DIAGNOSIS — E785 Hyperlipidemia, unspecified: Secondary | ICD-10-CM | POA: Diagnosis not present

## 2017-08-11 DIAGNOSIS — E119 Type 2 diabetes mellitus without complications: Secondary | ICD-10-CM | POA: Diagnosis not present

## 2017-08-17 DIAGNOSIS — S41111A Laceration without foreign body of right upper arm, initial encounter: Secondary | ICD-10-CM | POA: Diagnosis not present

## 2017-08-22 DIAGNOSIS — R509 Fever, unspecified: Secondary | ICD-10-CM | POA: Diagnosis not present

## 2017-09-06 DIAGNOSIS — L84 Corns and callosities: Secondary | ICD-10-CM | POA: Diagnosis not present

## 2017-09-06 DIAGNOSIS — E1151 Type 2 diabetes mellitus with diabetic peripheral angiopathy without gangrene: Secondary | ICD-10-CM | POA: Diagnosis not present

## 2017-09-20 DIAGNOSIS — I2510 Atherosclerotic heart disease of native coronary artery without angina pectoris: Secondary | ICD-10-CM | POA: Diagnosis not present

## 2017-09-20 DIAGNOSIS — E785 Hyperlipidemia, unspecified: Secondary | ICD-10-CM | POA: Diagnosis not present

## 2017-09-20 DIAGNOSIS — G8929 Other chronic pain: Secondary | ICD-10-CM | POA: Diagnosis not present

## 2017-09-20 DIAGNOSIS — I10 Essential (primary) hypertension: Secondary | ICD-10-CM | POA: Diagnosis not present

## 2017-09-27 DIAGNOSIS — F329 Major depressive disorder, single episode, unspecified: Secondary | ICD-10-CM | POA: Diagnosis not present

## 2017-09-28 DIAGNOSIS — W19XXXA Unspecified fall, initial encounter: Secondary | ICD-10-CM | POA: Diagnosis not present

## 2017-09-28 DIAGNOSIS — Z043 Encounter for examination and observation following other accident: Secondary | ICD-10-CM | POA: Diagnosis not present

## 2017-11-09 DIAGNOSIS — E039 Hypothyroidism, unspecified: Secondary | ICD-10-CM | POA: Diagnosis not present

## 2017-11-09 DIAGNOSIS — I10 Essential (primary) hypertension: Secondary | ICD-10-CM | POA: Diagnosis not present

## 2017-11-09 DIAGNOSIS — E119 Type 2 diabetes mellitus without complications: Secondary | ICD-10-CM | POA: Diagnosis not present

## 2017-11-23 DIAGNOSIS — E1151 Type 2 diabetes mellitus with diabetic peripheral angiopathy without gangrene: Secondary | ICD-10-CM | POA: Diagnosis not present

## 2017-11-23 DIAGNOSIS — L84 Corns and callosities: Secondary | ICD-10-CM | POA: Diagnosis not present

## 2017-11-28 DIAGNOSIS — F321 Major depressive disorder, single episode, moderate: Secondary | ICD-10-CM | POA: Diagnosis not present

## 2017-11-30 DIAGNOSIS — R4182 Altered mental status, unspecified: Secondary | ICD-10-CM | POA: Diagnosis not present

## 2017-11-30 DIAGNOSIS — D649 Anemia, unspecified: Secondary | ICD-10-CM | POA: Diagnosis not present

## 2017-12-05 DIAGNOSIS — M1712 Unilateral primary osteoarthritis, left knee: Secondary | ICD-10-CM | POA: Diagnosis not present

## 2017-12-05 DIAGNOSIS — M79662 Pain in left lower leg: Secondary | ICD-10-CM | POA: Diagnosis not present

## 2017-12-05 DIAGNOSIS — M25552 Pain in left hip: Secondary | ICD-10-CM | POA: Diagnosis not present

## 2017-12-05 DIAGNOSIS — M79652 Pain in left thigh: Secondary | ICD-10-CM | POA: Diagnosis not present

## 2017-12-05 DIAGNOSIS — M1612 Unilateral primary osteoarthritis, left hip: Secondary | ICD-10-CM | POA: Diagnosis not present

## 2017-12-19 DIAGNOSIS — K567 Ileus, unspecified: Secondary | ICD-10-CM | POA: Diagnosis not present

## 2017-12-19 DIAGNOSIS — R112 Nausea with vomiting, unspecified: Secondary | ICD-10-CM | POA: Diagnosis not present

## 2017-12-20 DIAGNOSIS — R4182 Altered mental status, unspecified: Secondary | ICD-10-CM | POA: Diagnosis not present

## 2017-12-20 DIAGNOSIS — Z79899 Other long term (current) drug therapy: Secondary | ICD-10-CM | POA: Diagnosis not present

## 2017-12-20 DIAGNOSIS — D649 Anemia, unspecified: Secondary | ICD-10-CM | POA: Diagnosis not present

## 2017-12-20 DIAGNOSIS — R319 Hematuria, unspecified: Secondary | ICD-10-CM | POA: Diagnosis not present

## 2017-12-21 DIAGNOSIS — N3090 Cystitis, unspecified without hematuria: Secondary | ICD-10-CM | POA: Diagnosis not present

## 2017-12-21 DIAGNOSIS — R112 Nausea with vomiting, unspecified: Secondary | ICD-10-CM | POA: Diagnosis not present

## 2017-12-21 DIAGNOSIS — K567 Ileus, unspecified: Secondary | ICD-10-CM | POA: Diagnosis not present

## 2018-01-02 DIAGNOSIS — R21 Rash and other nonspecific skin eruption: Secondary | ICD-10-CM | POA: Diagnosis not present

## 2018-01-03 DIAGNOSIS — R4182 Altered mental status, unspecified: Secondary | ICD-10-CM | POA: Diagnosis not present

## 2018-01-03 DIAGNOSIS — R21 Rash and other nonspecific skin eruption: Secondary | ICD-10-CM | POA: Diagnosis not present

## 2018-01-03 DIAGNOSIS — K567 Ileus, unspecified: Secondary | ICD-10-CM | POA: Diagnosis not present

## 2018-01-04 DIAGNOSIS — B309 Viral conjunctivitis, unspecified: Secondary | ICD-10-CM | POA: Diagnosis not present

## 2018-01-04 DIAGNOSIS — R21 Rash and other nonspecific skin eruption: Secondary | ICD-10-CM | POA: Diagnosis not present

## 2018-01-09 DIAGNOSIS — B309 Viral conjunctivitis, unspecified: Secondary | ICD-10-CM | POA: Diagnosis not present

## 2018-01-09 DIAGNOSIS — R21 Rash and other nonspecific skin eruption: Secondary | ICD-10-CM | POA: Diagnosis not present

## 2018-01-19 ENCOUNTER — Encounter (HOSPITAL_COMMUNITY): Payer: Self-pay

## 2018-01-19 ENCOUNTER — Other Ambulatory Visit: Payer: Self-pay

## 2018-01-19 ENCOUNTER — Emergency Department (HOSPITAL_COMMUNITY)
Admission: EM | Admit: 2018-01-19 | Discharge: 2018-01-19 | Disposition: A | Discharge status: Home / Self Care | Payer: Medicare Other | Attending: Emergency Medicine | Admitting: Emergency Medicine

## 2018-01-19 DIAGNOSIS — F0390 Unspecified dementia without behavioral disturbance: Secondary | ICD-10-CM | POA: Insufficient documentation

## 2018-01-19 DIAGNOSIS — I2510 Atherosclerotic heart disease of native coronary artery without angina pectoris: Secondary | ICD-10-CM | POA: Insufficient documentation

## 2018-01-19 DIAGNOSIS — Z7901 Long term (current) use of anticoagulants: Secondary | ICD-10-CM | POA: Diagnosis not present

## 2018-01-19 DIAGNOSIS — R197 Diarrhea, unspecified: Secondary | ICD-10-CM | POA: Diagnosis not present

## 2018-01-19 DIAGNOSIS — K920 Hematemesis: Secondary | ICD-10-CM | POA: Diagnosis not present

## 2018-01-19 DIAGNOSIS — E119 Type 2 diabetes mellitus without complications: Secondary | ICD-10-CM | POA: Insufficient documentation

## 2018-01-19 DIAGNOSIS — I10 Essential (primary) hypertension: Secondary | ICD-10-CM | POA: Diagnosis not present

## 2018-01-19 DIAGNOSIS — Z794 Long term (current) use of insulin: Secondary | ICD-10-CM | POA: Diagnosis not present

## 2018-01-19 DIAGNOSIS — R0902 Hypoxemia: Secondary | ICD-10-CM | POA: Diagnosis not present

## 2018-01-19 DIAGNOSIS — R404 Transient alteration of awareness: Secondary | ICD-10-CM | POA: Diagnosis not present

## 2018-01-19 DIAGNOSIS — E039 Hypothyroidism, unspecified: Secondary | ICD-10-CM | POA: Diagnosis not present

## 2018-01-19 DIAGNOSIS — K2970 Gastritis, unspecified, without bleeding: Secondary | ICD-10-CM

## 2018-01-19 DIAGNOSIS — K921 Melena: Secondary | ICD-10-CM | POA: Diagnosis present

## 2018-01-19 DIAGNOSIS — Z7401 Bed confinement status: Secondary | ICD-10-CM | POA: Diagnosis not present

## 2018-01-19 DIAGNOSIS — Z951 Presence of aortocoronary bypass graft: Secondary | ICD-10-CM | POA: Insufficient documentation

## 2018-01-19 DIAGNOSIS — R402411 Glasgow coma scale score 13-15, in the field [EMT or ambulance]: Secondary | ICD-10-CM | POA: Diagnosis not present

## 2018-01-19 DIAGNOSIS — Z91040 Latex allergy status: Secondary | ICD-10-CM | POA: Diagnosis not present

## 2018-01-19 DIAGNOSIS — R4182 Altered mental status, unspecified: Secondary | ICD-10-CM | POA: Diagnosis not present

## 2018-01-19 DIAGNOSIS — Z79899 Other long term (current) drug therapy: Secondary | ICD-10-CM | POA: Diagnosis not present

## 2018-01-19 HISTORY — DX: Major depressive disorder, single episode, unspecified: F32.9

## 2018-01-19 HISTORY — DX: Unsteadiness on feet: R26.81

## 2018-01-19 HISTORY — DX: Depression, unspecified: F32.A

## 2018-01-19 HISTORY — DX: Unspecified psychosis not due to a substance or known physiological condition: F29

## 2018-01-19 HISTORY — DX: Pain in unspecified shoulder: M25.519

## 2018-01-19 HISTORY — DX: Cerebral infarction, unspecified: I63.9

## 2018-01-19 HISTORY — DX: Vitamin D deficiency, unspecified: E55.9

## 2018-01-19 HISTORY — DX: Dysarthria and anarthria: R47.1

## 2018-01-19 HISTORY — DX: Unspecified dementia, unspecified severity, without behavioral disturbance, psychotic disturbance, mood disturbance, and anxiety: F03.90

## 2018-01-19 HISTORY — DX: Nontraumatic intracerebral hemorrhage, unspecified: I61.9

## 2018-01-19 HISTORY — DX: Dysphagia, unspecified: R13.10

## 2018-01-19 LAB — CBC WITH DIFFERENTIAL/PLATELET
BASOS PCT: 0 %
Basophils Absolute: 0 10*3/uL (ref 0.0–0.1)
Eosinophils Absolute: 0.2 10*3/uL (ref 0.0–0.7)
Eosinophils Relative: 2 %
HEMATOCRIT: 38.3 % (ref 36.0–46.0)
Hemoglobin: 11.8 g/dL — ABNORMAL LOW (ref 12.0–15.0)
Lymphocytes Relative: 19 %
Lymphs Abs: 2 10*3/uL (ref 0.7–4.0)
MCH: 25.8 pg — ABNORMAL LOW (ref 26.0–34.0)
MCHC: 30.8 g/dL (ref 30.0–36.0)
MCV: 83.8 fL (ref 78.0–100.0)
Monocytes Absolute: 0.7 10*3/uL (ref 0.1–1.0)
Monocytes Relative: 7 %
NEUTROS ABS: 7.7 10*3/uL (ref 1.7–7.7)
Neutrophils Relative %: 72 %
Platelets: 255 10*3/uL (ref 150–400)
RBC: 4.57 MIL/uL (ref 3.87–5.11)
RDW: 15.4 % (ref 11.5–15.5)
WBC: 10.6 10*3/uL — ABNORMAL HIGH (ref 4.0–10.5)

## 2018-01-19 LAB — COMPREHENSIVE METABOLIC PANEL
ALBUMIN: 3.6 g/dL (ref 3.5–5.0)
ALK PHOS: 66 U/L (ref 38–126)
ALT: 11 U/L (ref 0–44)
AST: 19 U/L (ref 15–41)
Anion gap: 7 (ref 5–15)
BILIRUBIN TOTAL: 0.7 mg/dL (ref 0.3–1.2)
BUN: 20 mg/dL (ref 8–23)
CO2: 28 mmol/L (ref 22–32)
Calcium: 9.5 mg/dL (ref 8.9–10.3)
Chloride: 102 mmol/L (ref 98–111)
Creatinine, Ser: 0.69 mg/dL (ref 0.44–1.00)
GFR calc Af Amer: >60 mL/min (ref >60)
GFR calc non Af Amer: >60 mL/min (ref >60)
Glucose, Bld: 123 mg/dL — ABNORMAL HIGH (ref 70–99)
POTASSIUM: 4 mmol/L (ref 3.5–5.1)
Sodium: 137 mmol/L (ref 135–145)
TOTAL PROTEIN: 6.6 g/dL (ref 6.5–8.1)

## 2018-01-19 LAB — LIPASE, BLOOD: Lipase: 27 U/L (ref 11–51)

## 2018-01-19 LAB — POC OCCULT BLOOD, ED: Fecal Occult Bld: NEGATIVE

## 2018-01-19 MED ORDER — ONDANSETRON HCL 4 MG/2ML IJ SOLN
4.0000 mg | Freq: Once | INTRAMUSCULAR | Status: AC
  Administered 2018-01-19: 4 mg via INTRAVENOUS
  Filled 2018-01-19: qty 2

## 2018-01-19 MED ORDER — FAMOTIDINE IN NACL 20-0.9 MG/50ML-% IV SOLN
20.0000 mg | Freq: Once | INTRAVENOUS | Status: AC
  Administered 2018-01-19: 20 mg via INTRAVENOUS
  Filled 2018-01-19: qty 50

## 2018-01-19 MED ORDER — FAMOTIDINE 20 MG PO TABS: 20.0000 mg | ORAL_TABLET | Freq: Two times a day (BID) | ORAL | 0 refills | Status: AC

## 2018-01-19 MED ORDER — SODIUM CHLORIDE 0.9 % IV BOLUS
500.0000 mL | Freq: Once | INTRAVENOUS | Status: AC
  Administered 2018-01-19: 500 mL via INTRAVENOUS

## 2018-01-19 MED ORDER — ONDANSETRON HCL 4 MG PO TABS
4.0000 mg | ORAL_TABLET | Freq: Four times a day (QID) | ORAL | 0 refills | Status: AC
Start: 2018-01-19 — End: ?

## 2018-01-19 NOTE — ED Notes (Signed)
EDP at bedside updating patient and family. 

## 2018-01-19 NOTE — ED Notes (Signed)
EMS arrived to transport patient back to [facility].

## 2018-01-19 NOTE — Discharge Instructions (Signed)
Tests showed no life-threatening condition.  Medication to protect your stomach and antinausea medicine.  Follow-up with gastroenterologist if not getting better.  Phone number given.

## 2018-01-19 NOTE — ED Triage Notes (Signed)
PT resident of [facility] and was sent here for evaluation of coffee ground emesis and dark stools since last night.   Pt alert to self and place.  Pt presently denies any pain or nausea.  EMS placed IV in left AC.  CBG 138.

## 2018-01-19 NOTE — ED Notes (Signed)
Called EMS to transfer Pt back to [facility]

## 2018-01-19 NOTE — ED Provider Notes (Signed)
[facility] EMERGENCY DEPARTMENT Provider Note   CSN: [id number] Arrival date & time: 01/19/18  1028     History   Chief Complaint Chief Complaint  Patient presents with  . GI Bleeding    HPI [name] is a 84 y.o. female.  Level 5 caveat for dementia.  Patient presents from local nursing home with a complaint of "coffee-ground emesis and black stool"  per EMS.  Son and granddaughter report seeing none of these concerns.  No recent history of upper GI bleed.  Family reports normal behavior.     Past Medical History:  Diagnosis Date  . Arthritis   . Coronary atherosclerosis of native coronary artery    four-vessel CABG, 7/06, ... normal Cardiolite; EF 79%, 8/10  . Dementia   . Depression   . Dysarthria   . Dysphagia   . GERD (gastroesophageal reflux disease)   . GIB (gastrointestinal bleeding)    history of  . Hypothyroidism 06/14/2009  . Nontraumatic intracerebral hemorrhage ([place])   . Osteoarthritis   . Psychosis ([place])   . PUD (peptic ulcer disease)   . Pure hypercholesterolemia   . Shoulder pain   . Stroke [facility])    TIA  . Stromal tumor of the stomach    history of. partial gastrectomy 2001  . Type II or unspecified type diabetes mellitus without mention of complication, not stated as uncontrolled   . Unspecified essential hypertension   . Unspecified transient cerebral ischemia   . Unsteadiness on feet   . Vitamin D deficiency     Patient Active Problem List   Diagnosis Date Noted  . Pain in the chest   . Chest pain 08/08/2014  . Status post CVA 08/08/2014  . Chronic diastolic heart failure ([place]) 09/23/2010  . S/P carotid endarterectomy 09/23/2010  . Diabetes ([place]) 03/23/2010  . SHORTNESS OF BREATH 03/23/2010  . WEAKNESS 01/28/2009  . FLATULENCE ERUCTATION AND GAS PAIN 01/28/2009  . HYPERLIPIDEMIA-MIXED 01/27/2009  . MITRAL REGURGITATION, 1-2 PLUS 01/27/2009  . Essential hypertension 01/27/2009  . CAD, ARTERY BYPASS GRAFT 01/27/2009    Past  Surgical History:  Procedure Laterality Date  . ABDOMINAL HYSTERECTOMY    . CATARACT EXTRACTION    . CORONARY ARTERY BYPASS GRAFT     . four-vessel CABG, 7/06, ... normal Cardiolite; EF 79%, 8/10  . gallbladder resection    . [place]  . partial Thyroid surgery       OB History   None      Home Medications    Prior to Admission medications   Medication Sig Start Date End Date Taking? Authorizing Provider  albuterol (PROVENTIL) (2.5 MG/3ML) 0.083% nebulizer solution Take 2.5 mg by nebulization every 6 (six) hours as needed for wheezing or shortness of breath.   Yes [provider]  amLODipine (NORVASC) 2.5 MG tablet Take 2.5 mg by mouth daily.   Yes [provider]  Calcium 500 MG tablet Take 500 mg by mouth 2 (two) times daily.    Yes [provider]  cholecalciferol (VITAMIN D) 1000 units tablet Take 1,000 Units by mouth daily.   Yes [provider]  clopidogrel (PLAVIX) 75 MG tablet Take 75 mg by mouth daily.     Yes [provider]  Cranberry 475 MG CAPS Take 475 mg by mouth daily.   Yes [provider]  DULoxetine (CYMBALTA) 30 MG capsule Take 30 mg by mouth daily.   Yes [provider]  insulin glargine (LANTUS) 100 UNIT/ML injection Inject 20 Units into the skin at bedtime.    Yes [provider]  isosorbide mononitrate (IMDUR) 30 MG 24 hr tablet Take 30 mg by mouth daily.   Yes [provider]  levothyroxine (SYNTHROID, LEVOTHROID) 88 MCG tablet Take 88 mcg by mouth daily before breakfast.    Yes [provider]  lisinopril (PRINIVIL,ZESTRIL) 10 MG tablet Take 10 mg by mouth daily.    Yes [provider]  loratadine (CLARITIN) 10 MG tablet Take 10 mg by mouth daily.   Yes [provider]  metFORMIN (GLUCOPHAGE) 500 MG tablet Take 500 mg by mouth daily.   Yes [provider]  Multiple Vitamins-Minerals (CENTRUM SILVER PO)  Take 1 tablet by mouth daily.    Yes [provider]  Omega-3 Fatty Acids (FISH OIL) 1000 MG CAPS Take 1,000 mg by mouth daily.   Yes [provider]  omeprazole (PRILOSEC) 20 MG capsule Take 20 mg by mouth daily.   Yes [provider]  ondansetron (ZOFRAN) 4 MG tablet Take 4 mg by mouth every 6 (six) hours as needed for nausea or vomiting.   Yes [provider]  polyethylene glycol powder (GLYCOLAX/MIRALAX) powder Take 17 g by mouth daily.   Yes [provider]  Polyvinyl Alcohol-Povidone (FRESHKOTE) 2.7-2 % SOLN Apply 1 drop to eye 3 (three) times daily.   Yes [provider]  saccharomyces boulardii (FLORASTOR) 250 MG capsule Take 250 mg by mouth daily.   Yes [provider]  shark liver oil-cocoa butter (PREPARATION H) 0.25-3-85.5 % suppository Place 1 suppository rectally at bedtime.   Yes [provider]  Wheat Dextrin (BENEFIBER) POWD Take 5 mLs by mouth daily.   Yes [provider]  Amino Acids-Protein Hydrolys (FEEDING SUPPLEMENT, PRO-STAT SUGAR FREE 64,) LIQD Take 30 mLs by mouth daily. Patient not taking: Reported on 01/19/2018 08/09/14   Radene Gunning, NP  famotidine (PEPCID) 20 MG tablet Take 1 tablet (20 mg total) by mouth 2 (two) times daily. 01/19/18   Nat Christen, MD  nitroGLYCERIN (NITROSTAT) 0.4 MG SL tablet Place 1 tablet (0.4 mg total) under the tongue every 5 (five) minutes as needed. 09/15/10   de Stanford Scotland, MD  ondansetron (ZOFRAN) 4 MG tablet Take 1 tablet (4 mg total) by mouth every 6 (six) hours. 01/19/18   Nat Christen, MD  traMADol (ULTRAM) 50 MG tablet Take 0.5 tablets (25 mg total) by mouth every 6 (six) hours as needed. Patient not taking: Reported on 01/19/2018 02/22/14   Orpah Greek, MD    Family History Family History  Problem Relation Age of Onset  . Heart attack Brother 43  . Coronary artery disease Unknown   . Hypertension Unknown     Social History Social History    Tobacco Use  . Smoking status: Never Smoker  . Smokeless tobacco: Never Used  Substance Use Topics  . Alcohol use: No  . Drug use: No     Allergies   Codeine; Contrast media [iodinated diagnostic agents]; Sulfa antibiotics; Guaifenesin & derivatives; Iodine; Macrobid [nitrofurantoin macrocrystal]; Adhesive [tape]; and Latex   Review of Systems Review of Systems  Unable to perform ROS: Dementia     Physical Exam Updated Vital Signs BP 125/78   Pulse 87   Temp 99.6 F (37.6 C) (Oral)   Resp 18   Wt 61.2 kg   SpO2 100%   BMI 23.16 kg/m   Physical Exam  Constitutional:  She is oriented to person, place, and time.  Demented, pleasant  HENT:  Head: Normocephalic and atraumatic.  Eyes: Conjunctivae are normal.  Neck: Neck supple.  Cardiovascular: Normal rate and regular rhythm.  Pulmonary/Chest: Effort normal and breath sounds normal.  Abdominal: Soft. Bowel sounds are normal.  Genitourinary:  Genitourinary Comments: Rectal exam: No masses, brown stool, heme-negative.  Musculoskeletal: Normal range of motion.  Neurological: She is alert and oriented to person, place, and time.  Skin: Skin is warm and dry.  Psychiatric: She has a normal mood and affect. Her behavior is normal.  Nursing note and vitals reviewed.    ED Treatments / Results  Labs (all labs ordered are listed, but only abnormal results are displayed) Labs Reviewed  CBC WITH DIFFERENTIAL/PLATELET - Abnormal; Notable for the following components:      Result Value   WBC 10.6 (*)    Hemoglobin 11.8 (*)    MCH 25.8 (*)    All other components within normal limits  COMPREHENSIVE METABOLIC PANEL - Abnormal; Notable for the following components:   Glucose, Bld 123 (*)    All other components within normal limits  LIPASE, BLOOD  POC OCCULT BLOOD, ED    EKG None  Radiology No results found.  Procedures Procedures (including critical care time)  Medications Ordered in ED Medications  sodium  chloride 0.9 % bolus 500 mL (0 mLs Intravenous Stopped 01/19/18 1235)  ondansetron (ZOFRAN) injection 4 mg (4 mg Intravenous Given 01/19/18 1129)  famotidine (PEPCID) IVPB 20 mg premix (0 mg Intravenous Stopped 01/19/18 1203)     Initial Impression / Assessment and Plan / ED Course  I have reviewed the triage vital signs and the nursing notes.  Pertinent labs & imaging results that were available during my care of the patient were reviewed by me and considered in my medical decision making (see chart for details).     Patient presents with a concern of "coffee-ground emesis and black stool".  Patient was observed in the emergency department for 4 hours and none of these findings were noted.  Hemoglobin slightly subnormal.  Rectal exam shows brown stool heme-negative.  Discussed findings with the patient son and granddaughter.  Discharge medication Pepcid 20 mg and Zofran 4 mg.  Will follow-up with gastroenterology.  Final Clinical Impressions(s) / ED Diagnoses   Final diagnoses:  Gastritis without bleeding, unspecified chronicity, unspecified gastritis type    ED Discharge Orders         Ordered    ondansetron (ZOFRAN) 4 MG tablet  Every 6 hours     01/19/18 1445    famotidine (PEPCID) 20 MG tablet  2 times daily     01/19/18 1445           Nat Christen, MD 01/19/18 1456

## 2018-01-20 DIAGNOSIS — F321 Major depressive disorder, single episode, moderate: Secondary | ICD-10-CM | POA: Diagnosis not present

## 2018-01-23 ENCOUNTER — Encounter: Payer: Self-pay | Admitting: Internal Medicine

## 2018-01-25 DIAGNOSIS — K2970 Gastritis, unspecified, without bleeding: Secondary | ICD-10-CM | POA: Diagnosis not present

## 2018-01-26 DIAGNOSIS — D649 Anemia, unspecified: Secondary | ICD-10-CM | POA: Diagnosis not present

## 2018-01-26 DIAGNOSIS — K219 Gastro-esophageal reflux disease without esophagitis: Secondary | ICD-10-CM | POA: Diagnosis not present

## 2018-01-26 DIAGNOSIS — I4891 Unspecified atrial fibrillation: Secondary | ICD-10-CM | POA: Diagnosis not present

## 2018-01-26 DIAGNOSIS — D519 Vitamin B12 deficiency anemia, unspecified: Secondary | ICD-10-CM | POA: Diagnosis not present

## 2018-02-07 DIAGNOSIS — R21 Rash and other nonspecific skin eruption: Secondary | ICD-10-CM | POA: Diagnosis not present

## 2018-02-07 DIAGNOSIS — I10 Essential (primary) hypertension: Secondary | ICD-10-CM | POA: Diagnosis not present

## 2018-02-07 DIAGNOSIS — K2970 Gastritis, unspecified, without bleeding: Secondary | ICD-10-CM | POA: Diagnosis not present

## 2018-02-15 DIAGNOSIS — L84 Corns and callosities: Secondary | ICD-10-CM | POA: Diagnosis not present

## 2018-02-15 DIAGNOSIS — E1151 Type 2 diabetes mellitus with diabetic peripheral angiopathy without gangrene: Secondary | ICD-10-CM | POA: Diagnosis not present

## 2018-02-19 DIAGNOSIS — R1312 Dysphagia, oropharyngeal phase: Secondary | ICD-10-CM | POA: Diagnosis not present

## 2018-02-19 DIAGNOSIS — R2681 Unsteadiness on feet: Secondary | ICD-10-CM | POA: Diagnosis not present

## 2018-02-19 DIAGNOSIS — R41841 Cognitive communication deficit: Secondary | ICD-10-CM | POA: Diagnosis not present

## 2018-02-19 DIAGNOSIS — Z8673 Personal history of transient ischemic attack (TIA), and cerebral infarction without residual deficits: Secondary | ICD-10-CM | POA: Diagnosis not present

## 2018-02-19 DIAGNOSIS — E119 Type 2 diabetes mellitus without complications: Secondary | ICD-10-CM | POA: Diagnosis not present

## 2018-02-21 DIAGNOSIS — R41841 Cognitive communication deficit: Secondary | ICD-10-CM | POA: Diagnosis not present

## 2018-02-21 DIAGNOSIS — R1312 Dysphagia, oropharyngeal phase: Secondary | ICD-10-CM | POA: Diagnosis not present

## 2018-02-21 DIAGNOSIS — E119 Type 2 diabetes mellitus without complications: Secondary | ICD-10-CM | POA: Diagnosis not present

## 2018-02-21 DIAGNOSIS — R2681 Unsteadiness on feet: Secondary | ICD-10-CM | POA: Diagnosis not present

## 2018-02-21 DIAGNOSIS — Z8673 Personal history of transient ischemic attack (TIA), and cerebral infarction without residual deficits: Secondary | ICD-10-CM | POA: Diagnosis not present

## 2018-02-22 ENCOUNTER — Ambulatory Visit: Payer: Medicare Other | Admitting: Nurse Practitioner

## 2018-02-22 ENCOUNTER — Encounter: Payer: Self-pay | Admitting: Nurse Practitioner

## 2018-02-22 ENCOUNTER — Encounter

## 2018-02-22 ENCOUNTER — Ambulatory Visit (INDEPENDENT_AMBULATORY_CARE_PROVIDER_SITE_OTHER): Payer: Medicare Other | Admitting: Nurse Practitioner

## 2018-02-22 DIAGNOSIS — K922 Gastrointestinal hemorrhage, unspecified: Secondary | ICD-10-CM

## 2018-02-22 DIAGNOSIS — R41841 Cognitive communication deficit: Secondary | ICD-10-CM | POA: Diagnosis not present

## 2018-02-22 DIAGNOSIS — Z8673 Personal history of transient ischemic attack (TIA), and cerebral infarction without residual deficits: Secondary | ICD-10-CM | POA: Diagnosis not present

## 2018-02-22 DIAGNOSIS — D649 Anemia, unspecified: Secondary | ICD-10-CM

## 2018-02-22 DIAGNOSIS — E119 Type 2 diabetes mellitus without complications: Secondary | ICD-10-CM | POA: Diagnosis not present

## 2018-02-22 DIAGNOSIS — R2681 Unsteadiness on feet: Secondary | ICD-10-CM | POA: Diagnosis not present

## 2018-02-22 DIAGNOSIS — R1312 Dysphagia, oropharyngeal phase: Secondary | ICD-10-CM | POA: Diagnosis not present

## 2018-02-22 NOTE — Progress Notes (Signed)
Primary Care Physician:  Jani Gravel, MD Primary Gastroenterologist:  Dr. Gala Romney  Chief Complaint  Patient presents with  . GI Bleeding    HPI:   [name] is a 84 y.o. female who presents on referral from the emergency department for GI bleed.  The patient was seen in the emergency department 01/31/2018 for GI bleeding.  Level 5 caveat for dementia.  Presented from the nursing home with complaint of coffee-ground emesis and black stool per EMS.  Patient's family reported not seeing any of these concerns.  No recent history of upper GI bleed.  Review of systems was not performed due to dementia.  Rectal exam found no masses, brown stool which was found to be heme negative.  Hemoglobin was mildly suppressed at 11.8.  The patient was in the emergency room for 4 hours and no noted melena, hematemesis.  The patient was discharged on Pepcid 20 mg and recommended to follow-up with GI.  Today she is accompanied by her granddaughter who is a Marine scientist at a SNF facility.  Today her granddaughter states she had history of stomach cancer and partial gastrectomy. Has large bowel movements and feels the facility doesn't take her to the bathroom often enough and feels this likely caused her N/V. When in the ER they indicated no N/V, heme- on exam. Family knows nothing of coffee-ground emesis, melena, hasn't been told this by the family. Her daughter hasn't seen any hematochezia. No indications of abdominal pain. No signs of weakness or fatigue (no more sleeping than normal). She has not otherwise had any significant changes. Has a history of hemorrhoids.  Past Medical History:  Diagnosis Date  . Arthritis   . Coronary atherosclerosis of native coronary artery    four-vessel CABG, 7/06, ... normal Cardiolite; EF 79%, 8/10  . Dementia   . Depression   . Dysarthria   . Dysphagia   . GERD (gastroesophageal reflux disease)   . GIB (gastrointestinal bleeding)    history of  . Hypothyroidism 06/14/2009  .  Nontraumatic intracerebral hemorrhage ([place])   . Osteoarthritis   . Psychosis ([place])   . PUD (peptic ulcer disease)   . Pure hypercholesterolemia   . Shoulder pain   . Stroke [facility])    TIA  . Stromal tumor of the stomach    history of. partial gastrectomy 2001  . Type II or unspecified type diabetes mellitus without mention of complication, not stated as uncontrolled   . Unspecified essential hypertension   . Unspecified transient cerebral ischemia   . Unsteadiness on feet   . Vitamin D deficiency     Past Surgical History:  Procedure Laterality Date  . ABDOMINAL HYSTERECTOMY    . CATARACT EXTRACTION    . CORONARY ARTERY BYPASS GRAFT     . four-vessel CABG, 7/06, ... normal Cardiolite; EF 79%, 8/10  . gallbladder resection    . [place]  . partial Thyroid surgery      Current Outpatient Medications  Medication Sig Dispense Refill  . albuterol (PROVENTIL) (2.5 MG/3ML) 0.083% nebulizer solution Take 2.5 mg by nebulization every 6 (six) hours as needed for wheezing or shortness of breath.    Marland Kitchen amLODipine (NORVASC) 2.5 MG tablet Take 2.5 mg by mouth daily.    . Calcium 500 MG tablet Take 500 mg by mouth 2 (two) times daily.     . cholecalciferol (VITAMIN D) 1000 units tablet Take 1,000 Units by mouth daily.    Marland Kitchen  clopidogrel (PLAVIX) 75 MG tablet Take 75 mg by mouth daily.      . Cranberry 475 MG CAPS Take 475 mg by mouth daily.    . DULoxetine (CYMBALTA) 30 MG capsule Take 30 mg by mouth daily.    . famotidine (PEPCID) 20 MG tablet Take 1 tablet (20 mg total) by mouth 2 (two) times daily. 20 tablet 0  . insulin glargine (LANTUS) 100 UNIT/ML injection Inject 15 Units into the skin at bedtime.     . isosorbide mononitrate (IMDUR) 30 MG 24 hr tablet Take 30 mg by mouth daily.    Marland Kitchen levothyroxine (SYNTHROID, LEVOTHROID) 88 MCG tablet Take 88 mcg by mouth daily before breakfast.     . lisinopril (PRINIVIL,ZESTRIL) 10 MG tablet Take 10 mg by mouth  daily.     Marland Kitchen loratadine (CLARITIN) 10 MG tablet Take 10 mg by mouth daily.    . metFORMIN (GLUCOPHAGE) 500 MG tablet Take 500 mg by mouth daily.    . Multiple Vitamins-Minerals (CENTRUM SILVER PO) Take 1 tablet by mouth daily.     . Omega-3 Fatty Acids (FISH OIL) 1000 MG CAPS Take 1,000 mg by mouth daily.    Marland Kitchen omeprazole (PRILOSEC) 20 MG capsule Take 20 mg by mouth daily.    . ondansetron (ZOFRAN) 4 MG tablet Take 4 mg by mouth every 6 (six) hours as needed for nausea or vomiting.    . ondansetron (ZOFRAN) 4 MG tablet Take 1 tablet (4 mg total) by mouth every 6 (six) hours. 8 tablet 0  . polycarbophil (FIBERCON) 625 MG tablet Take 625 mg by mouth daily.    . polyethylene glycol powder (GLYCOLAX/MIRALAX) powder Take 17 g by mouth 2 (two) times daily.     . Polyvinyl Alcohol-Povidone (FRESHKOTE) 2.7-2 % SOLN Apply 1 drop to eye 3 (three) times daily.    . Probiotic Product (PROBIOTIC DAILY PO) Take by mouth daily.    . shark liver oil-cocoa butter (PREPARATION H) 0.25-3-85.5 % suppository Place 1 suppository rectally as needed.      No current facility-administered medications for this visit.     Allergies as of 02/22/2018 - Review Complete 02/22/2018  Allergen Reaction Noted  . Codeine Hives   . Contrast media [iodinated diagnostic agents] Hives 02/27/2011  . Sulfa antibiotics Hives 02/27/2011  . Guaifenesin & derivatives  02/22/2014  . Iodine  02/22/2014  . Macrobid [nitrofurantoin macrocrystal]  01/19/2018  . Adhesive [tape] Rash 03/07/2013  . Latex Rash 03/07/2013    Family History  Problem Relation Age of Onset  . Heart attack Brother 22  . Coronary artery disease Unknown   . Hypertension Unknown     Social History   Socioeconomic History  . Marital status: Widowed    Spouse name: Not on file  . Number of children: Not on file  . Years of education: Not on file  . Highest education level: Not on file  Occupational History  . Occupation: RETIRED  Social Needs  .  Financial resource strain: Not on file  . Food insecurity:    Worry: Not on file    Inability: Not on file  . Transportation needs:    Medical: Not on file    Non-medical: Not on file  Tobacco Use  . Smoking status: Never Smoker  . Smokeless tobacco: Never Used  Substance and Sexual Activity  . Alcohol use: No  . Drug use: No  . Sexual activity: Not Currently  Lifestyle  . Physical activity:  Days per week: Not on file    Minutes per session: Not on file  . Stress: Not on file  Relationships  . Social connections:    Talks on phone: Not on file    Gets together: Not on file    Attends religious service: Not on file    Active member of club or organization: Not on file    Attends meetings of clubs or organizations: Not on file    Relationship status: Not on file  . Intimate partner violence:    Fear of current or ex partner: Not on file    Emotionally abused: Not on file    Physically abused: Not on file    Forced sexual activity: Not on file  Other Topics Concern  . Not on file  Social History Narrative  . Not on file    Review of Systems: Significantly limited due to dementia. See HPI for family input. GI: See history of present illness. Denies abdominal pain.   Physical Exam: BP 115/76   Pulse 83   Temp 98 F (36.7 C) (Oral)   Ht 5\' 4"  (1.626 m)   Wt 99 lb 9.6 oz (45.2 kg)   BMI 17.10 kg/m  General:   Alert and oriented. Pleasant and cooperative. Well-nourished and well-developed. Pleasantly confused.  Head:  Normocephalic and atraumatic. Eyes:  Without icterus, sclera clear and conjunctiva pink.  Ears:  Normal auditory acuity. Cardiovascular:  S1, S2 present without murmurs appreciated. Extremities without clubbing or edema. Respiratory:  Clear to auscultation bilaterally. No wheezes, rales, or rhonchi. No distress.  Gastrointestinal:  +BS, soft, non-tender and non-distended. No HSM noted. No guarding or rebound. No masses appreciated.  Rectal:   Deferred  Musculoskalatal:  Symmetrical without gross deformities. Neurologic:  Alert and oriented x4;  grossly normal neurologically. Psych:  Alert and cooperative. Normal mood and affect. Heme/Lymph/Immune: No excessive bruising noted.    02/22/2018 10:37 AM   Disclaimer: This note was dictated with voice recognition software. Similar sounding words can inadvertently be transcribed and may not be corrected upon review.

## 2018-02-22 NOTE — Assessment & Plan Note (Signed)
Review of labs shows a history of anemia.  At the facility in June her hemoglobin was 11.1.  In the emergency department in August it was 11.8 which was normocytic and very mildly hypochromic.  The last values we have in the system were from 3 to 4 years ago which shows a normal hemoglobin.  She does have a history of gastric cancer with partial gastrectomy as well as peptic ulcer disease.  She is on PPI once daily.  I will increase this to twice a day, check CBC and iron studies and follow-up in 3 months.  Family is not wanting invasive evaluation given her age and her significant dementia.

## 2018-02-22 NOTE — Assessment & Plan Note (Signed)
The patient was brought to the emergency room by EMS from the facility with complaints of hematemesis and melena.  In the emergency room her hemoglobin was 11.8 which is an improvement from 11.1 at the facility in June.  Heme-negative on rectal exam.  No nausea, vomiting, hematemesis, melena in the emergency department.  She was referred to GI.  At this point I doubt an acute GI bleed given her labs and progression while in the ER for a significant number of hours.  She is on Plavix and does have a history of partial gastrectomy for gastric cancer and her could be anastomotic irritation versus gastritis that could have caused some self-limiting melena in the setting of antiplatelet therapy.  I will stop her Pepcid and start her on Prilosec 20 mg twice a day for 3 months then decrease to once a day.  The family is not wanting extensive evaluation at this time given her age, dementia.  I will check labs including CBC, iron, ferritin.  Follow-up in 3 months.

## 2018-02-22 NOTE — Progress Notes (Signed)
cc'ed to pcp °

## 2018-02-22 NOTE — Patient Instructions (Signed)
1. Increase Prilosec to 20 mg twice a day, taken 30 minutes before meal. 2. In 3 months she can return to Prilosec 20 mg once a day on an empty stomach. 3. I have put in orders for labs including CBC, iron, ferritin. 4. Stop Pepcid for now. 5. Return for follow-up in 3 months. 6. Call us if any questions or concerns.  At [facility] we value your feedback. You may receive a survey about your visit today. Please share your experience as we strive to create trusting relationships with our patients to provide genuine, compassionate, quality care.  We appreciate your understanding and patience as we review any laboratory studies, imaging, and other diagnostic tests that are ordered as we care for you. Our office policy is 5 business days for review of these results, and any emergent or urgent results are addressed in a timely manner for your best interest. If you do not hear from our office in 1 week, please contact us.   We also encourage the use of MyChart, which contains your medical information for your review as well. If you are not enrolled in this feature, an access code is on this after visit summary for your convenience. Thank you for allowing [place] to be involved in your care.  It was great to meet you today!  I hope you have a great summer!!

## 2018-02-23 DIAGNOSIS — R41841 Cognitive communication deficit: Secondary | ICD-10-CM | POA: Diagnosis not present

## 2018-02-23 DIAGNOSIS — R1312 Dysphagia, oropharyngeal phase: Secondary | ICD-10-CM | POA: Diagnosis not present

## 2018-02-23 DIAGNOSIS — R2681 Unsteadiness on feet: Secondary | ICD-10-CM | POA: Diagnosis not present

## 2018-02-23 DIAGNOSIS — Z8673 Personal history of transient ischemic attack (TIA), and cerebral infarction without residual deficits: Secondary | ICD-10-CM | POA: Diagnosis not present

## 2018-02-23 DIAGNOSIS — E119 Type 2 diabetes mellitus without complications: Secondary | ICD-10-CM | POA: Diagnosis not present

## 2018-02-24 DIAGNOSIS — R2681 Unsteadiness on feet: Secondary | ICD-10-CM | POA: Diagnosis not present

## 2018-02-24 DIAGNOSIS — R41841 Cognitive communication deficit: Secondary | ICD-10-CM | POA: Diagnosis not present

## 2018-02-24 DIAGNOSIS — E119 Type 2 diabetes mellitus without complications: Secondary | ICD-10-CM | POA: Diagnosis not present

## 2018-02-24 DIAGNOSIS — R1312 Dysphagia, oropharyngeal phase: Secondary | ICD-10-CM | POA: Diagnosis not present

## 2018-02-24 DIAGNOSIS — Z8673 Personal history of transient ischemic attack (TIA), and cerebral infarction without residual deficits: Secondary | ICD-10-CM | POA: Diagnosis not present

## 2018-02-27 DIAGNOSIS — R2681 Unsteadiness on feet: Secondary | ICD-10-CM | POA: Diagnosis not present

## 2018-02-27 DIAGNOSIS — E119 Type 2 diabetes mellitus without complications: Secondary | ICD-10-CM | POA: Diagnosis not present

## 2018-02-27 DIAGNOSIS — R41841 Cognitive communication deficit: Secondary | ICD-10-CM | POA: Diagnosis not present

## 2018-02-27 DIAGNOSIS — Z8673 Personal history of transient ischemic attack (TIA), and cerebral infarction without residual deficits: Secondary | ICD-10-CM | POA: Diagnosis not present

## 2018-02-27 DIAGNOSIS — R1312 Dysphagia, oropharyngeal phase: Secondary | ICD-10-CM | POA: Diagnosis not present

## 2018-02-28 DIAGNOSIS — R2681 Unsteadiness on feet: Secondary | ICD-10-CM | POA: Diagnosis not present

## 2018-02-28 DIAGNOSIS — Z8673 Personal history of transient ischemic attack (TIA), and cerebral infarction without residual deficits: Secondary | ICD-10-CM | POA: Diagnosis not present

## 2018-02-28 DIAGNOSIS — R1312 Dysphagia, oropharyngeal phase: Secondary | ICD-10-CM | POA: Diagnosis not present

## 2018-02-28 DIAGNOSIS — E119 Type 2 diabetes mellitus without complications: Secondary | ICD-10-CM | POA: Diagnosis not present

## 2018-02-28 DIAGNOSIS — R41841 Cognitive communication deficit: Secondary | ICD-10-CM | POA: Diagnosis not present

## 2018-03-01 DIAGNOSIS — R2681 Unsteadiness on feet: Secondary | ICD-10-CM | POA: Diagnosis not present

## 2018-03-01 DIAGNOSIS — R1312 Dysphagia, oropharyngeal phase: Secondary | ICD-10-CM | POA: Diagnosis not present

## 2018-03-01 DIAGNOSIS — E119 Type 2 diabetes mellitus without complications: Secondary | ICD-10-CM | POA: Diagnosis not present

## 2018-03-01 DIAGNOSIS — R41841 Cognitive communication deficit: Secondary | ICD-10-CM | POA: Diagnosis not present

## 2018-03-01 DIAGNOSIS — Z8673 Personal history of transient ischemic attack (TIA), and cerebral infarction without residual deficits: Secondary | ICD-10-CM | POA: Diagnosis not present

## 2018-03-02 DIAGNOSIS — R1312 Dysphagia, oropharyngeal phase: Secondary | ICD-10-CM | POA: Diagnosis not present

## 2018-03-02 DIAGNOSIS — R2681 Unsteadiness on feet: Secondary | ICD-10-CM | POA: Diagnosis not present

## 2018-03-02 DIAGNOSIS — E119 Type 2 diabetes mellitus without complications: Secondary | ICD-10-CM | POA: Diagnosis not present

## 2018-03-02 DIAGNOSIS — R41841 Cognitive communication deficit: Secondary | ICD-10-CM | POA: Diagnosis not present

## 2018-03-02 DIAGNOSIS — Z8673 Personal history of transient ischemic attack (TIA), and cerebral infarction without residual deficits: Secondary | ICD-10-CM | POA: Diagnosis not present

## 2018-03-03 DIAGNOSIS — Z8673 Personal history of transient ischemic attack (TIA), and cerebral infarction without residual deficits: Secondary | ICD-10-CM | POA: Diagnosis not present

## 2018-03-03 DIAGNOSIS — R1312 Dysphagia, oropharyngeal phase: Secondary | ICD-10-CM | POA: Diagnosis not present

## 2018-03-03 DIAGNOSIS — R2681 Unsteadiness on feet: Secondary | ICD-10-CM | POA: Diagnosis not present

## 2018-03-03 DIAGNOSIS — E119 Type 2 diabetes mellitus without complications: Secondary | ICD-10-CM | POA: Diagnosis not present

## 2018-03-03 DIAGNOSIS — R41841 Cognitive communication deficit: Secondary | ICD-10-CM | POA: Diagnosis not present

## 2018-03-05 DIAGNOSIS — E119 Type 2 diabetes mellitus without complications: Secondary | ICD-10-CM | POA: Diagnosis not present

## 2018-03-05 DIAGNOSIS — R2681 Unsteadiness on feet: Secondary | ICD-10-CM | POA: Diagnosis not present

## 2018-03-05 DIAGNOSIS — Z8673 Personal history of transient ischemic attack (TIA), and cerebral infarction without residual deficits: Secondary | ICD-10-CM | POA: Diagnosis not present

## 2018-03-05 DIAGNOSIS — R1312 Dysphagia, oropharyngeal phase: Secondary | ICD-10-CM | POA: Diagnosis not present

## 2018-03-05 DIAGNOSIS — R41841 Cognitive communication deficit: Secondary | ICD-10-CM | POA: Diagnosis not present

## 2018-03-06 DIAGNOSIS — R41841 Cognitive communication deficit: Secondary | ICD-10-CM | POA: Diagnosis not present

## 2018-03-06 DIAGNOSIS — Z8673 Personal history of transient ischemic attack (TIA), and cerebral infarction without residual deficits: Secondary | ICD-10-CM | POA: Diagnosis not present

## 2018-03-06 DIAGNOSIS — E119 Type 2 diabetes mellitus without complications: Secondary | ICD-10-CM | POA: Diagnosis not present

## 2018-03-06 DIAGNOSIS — R1312 Dysphagia, oropharyngeal phase: Secondary | ICD-10-CM | POA: Diagnosis not present

## 2018-03-06 DIAGNOSIS — R2681 Unsteadiness on feet: Secondary | ICD-10-CM | POA: Diagnosis not present

## 2018-03-07 DIAGNOSIS — R634 Abnormal weight loss: Secondary | ICD-10-CM | POA: Diagnosis not present

## 2018-03-07 DIAGNOSIS — K140 Glossitis: Secondary | ICD-10-CM | POA: Diagnosis not present

## 2018-03-08 DIAGNOSIS — R1312 Dysphagia, oropharyngeal phase: Secondary | ICD-10-CM | POA: Diagnosis not present

## 2018-03-08 DIAGNOSIS — R41841 Cognitive communication deficit: Secondary | ICD-10-CM | POA: Diagnosis not present

## 2018-03-08 DIAGNOSIS — E119 Type 2 diabetes mellitus without complications: Secondary | ICD-10-CM | POA: Diagnosis not present

## 2018-03-08 DIAGNOSIS — Z8673 Personal history of transient ischemic attack (TIA), and cerebral infarction without residual deficits: Secondary | ICD-10-CM | POA: Diagnosis not present

## 2018-03-08 DIAGNOSIS — R2681 Unsteadiness on feet: Secondary | ICD-10-CM | POA: Diagnosis not present

## 2018-03-09 DIAGNOSIS — E119 Type 2 diabetes mellitus without complications: Secondary | ICD-10-CM | POA: Diagnosis not present

## 2018-03-09 DIAGNOSIS — R634 Abnormal weight loss: Secondary | ICD-10-CM | POA: Diagnosis not present

## 2018-03-09 DIAGNOSIS — R41841 Cognitive communication deficit: Secondary | ICD-10-CM | POA: Diagnosis not present

## 2018-03-09 DIAGNOSIS — F0390 Unspecified dementia without behavioral disturbance: Secondary | ICD-10-CM | POA: Diagnosis not present

## 2018-03-09 DIAGNOSIS — Z8673 Personal history of transient ischemic attack (TIA), and cerebral infarction without residual deficits: Secondary | ICD-10-CM | POA: Diagnosis not present

## 2018-03-09 DIAGNOSIS — R1312 Dysphagia, oropharyngeal phase: Secondary | ICD-10-CM | POA: Diagnosis not present

## 2018-03-09 DIAGNOSIS — R2681 Unsteadiness on feet: Secondary | ICD-10-CM | POA: Diagnosis not present

## 2018-03-10 DIAGNOSIS — R1312 Dysphagia, oropharyngeal phase: Secondary | ICD-10-CM | POA: Diagnosis not present

## 2018-03-10 DIAGNOSIS — R41841 Cognitive communication deficit: Secondary | ICD-10-CM | POA: Diagnosis not present

## 2018-03-10 DIAGNOSIS — R2681 Unsteadiness on feet: Secondary | ICD-10-CM | POA: Diagnosis not present

## 2018-03-10 DIAGNOSIS — Z8673 Personal history of transient ischemic attack (TIA), and cerebral infarction without residual deficits: Secondary | ICD-10-CM | POA: Diagnosis not present

## 2018-03-10 DIAGNOSIS — E119 Type 2 diabetes mellitus without complications: Secondary | ICD-10-CM | POA: Diagnosis not present

## 2018-03-12 DIAGNOSIS — E119 Type 2 diabetes mellitus without complications: Secondary | ICD-10-CM | POA: Diagnosis not present

## 2018-03-12 DIAGNOSIS — R2681 Unsteadiness on feet: Secondary | ICD-10-CM | POA: Diagnosis not present

## 2018-03-12 DIAGNOSIS — R41841 Cognitive communication deficit: Secondary | ICD-10-CM | POA: Diagnosis not present

## 2018-03-12 DIAGNOSIS — Z8673 Personal history of transient ischemic attack (TIA), and cerebral infarction without residual deficits: Secondary | ICD-10-CM | POA: Diagnosis not present

## 2018-03-12 DIAGNOSIS — R1312 Dysphagia, oropharyngeal phase: Secondary | ICD-10-CM | POA: Diagnosis not present

## 2018-03-14 DIAGNOSIS — R1310 Dysphagia, unspecified: Secondary | ICD-10-CM | POA: Diagnosis not present

## 2018-03-14 DIAGNOSIS — R41841 Cognitive communication deficit: Secondary | ICD-10-CM | POA: Diagnosis not present

## 2018-03-14 DIAGNOSIS — Z8673 Personal history of transient ischemic attack (TIA), and cerebral infarction without residual deficits: Secondary | ICD-10-CM | POA: Diagnosis not present

## 2018-03-15 DIAGNOSIS — R1310 Dysphagia, unspecified: Secondary | ICD-10-CM | POA: Diagnosis not present

## 2018-03-15 DIAGNOSIS — R41841 Cognitive communication deficit: Secondary | ICD-10-CM | POA: Diagnosis not present

## 2018-03-15 DIAGNOSIS — Z8673 Personal history of transient ischemic attack (TIA), and cerebral infarction without residual deficits: Secondary | ICD-10-CM | POA: Diagnosis not present

## 2018-03-16 DIAGNOSIS — L309 Dermatitis, unspecified: Secondary | ICD-10-CM | POA: Diagnosis not present

## 2018-03-16 DIAGNOSIS — Z8673 Personal history of transient ischemic attack (TIA), and cerebral infarction without residual deficits: Secondary | ICD-10-CM | POA: Diagnosis not present

## 2018-03-16 DIAGNOSIS — R1310 Dysphagia, unspecified: Secondary | ICD-10-CM | POA: Diagnosis not present

## 2018-03-16 DIAGNOSIS — R41841 Cognitive communication deficit: Secondary | ICD-10-CM | POA: Diagnosis not present

## 2018-03-17 DIAGNOSIS — R41841 Cognitive communication deficit: Secondary | ICD-10-CM | POA: Diagnosis not present

## 2018-03-17 DIAGNOSIS — Z8673 Personal history of transient ischemic attack (TIA), and cerebral infarction without residual deficits: Secondary | ICD-10-CM | POA: Diagnosis not present

## 2018-03-17 DIAGNOSIS — R1310 Dysphagia, unspecified: Secondary | ICD-10-CM | POA: Diagnosis not present

## 2018-03-18 DIAGNOSIS — F331 Major depressive disorder, recurrent, moderate: Secondary | ICD-10-CM | POA: Diagnosis not present

## 2018-03-20 DIAGNOSIS — Z79899 Other long term (current) drug therapy: Secondary | ICD-10-CM | POA: Diagnosis not present

## 2018-03-20 DIAGNOSIS — Z8673 Personal history of transient ischemic attack (TIA), and cerebral infarction without residual deficits: Secondary | ICD-10-CM | POA: Diagnosis not present

## 2018-03-20 DIAGNOSIS — L309 Dermatitis, unspecified: Secondary | ICD-10-CM | POA: Diagnosis not present

## 2018-03-20 DIAGNOSIS — R1310 Dysphagia, unspecified: Secondary | ICD-10-CM | POA: Diagnosis not present

## 2018-03-20 DIAGNOSIS — R41841 Cognitive communication deficit: Secondary | ICD-10-CM | POA: Diagnosis not present

## 2018-03-21 DIAGNOSIS — R41841 Cognitive communication deficit: Secondary | ICD-10-CM | POA: Diagnosis not present

## 2018-03-21 DIAGNOSIS — R1310 Dysphagia, unspecified: Secondary | ICD-10-CM | POA: Diagnosis not present

## 2018-03-21 DIAGNOSIS — Z8673 Personal history of transient ischemic attack (TIA), and cerebral infarction without residual deficits: Secondary | ICD-10-CM | POA: Diagnosis not present

## 2018-03-22 DIAGNOSIS — R41841 Cognitive communication deficit: Secondary | ICD-10-CM | POA: Diagnosis not present

## 2018-03-22 DIAGNOSIS — R1310 Dysphagia, unspecified: Secondary | ICD-10-CM | POA: Diagnosis not present

## 2018-03-22 DIAGNOSIS — Z8673 Personal history of transient ischemic attack (TIA), and cerebral infarction without residual deficits: Secondary | ICD-10-CM | POA: Diagnosis not present

## 2018-03-23 DIAGNOSIS — Z8673 Personal history of transient ischemic attack (TIA), and cerebral infarction without residual deficits: Secondary | ICD-10-CM | POA: Diagnosis not present

## 2018-03-23 DIAGNOSIS — R41841 Cognitive communication deficit: Secondary | ICD-10-CM | POA: Diagnosis not present

## 2018-03-23 DIAGNOSIS — L309 Dermatitis, unspecified: Secondary | ICD-10-CM | POA: Diagnosis not present

## 2018-03-23 DIAGNOSIS — F0391 Unspecified dementia with behavioral disturbance: Secondary | ICD-10-CM | POA: Diagnosis not present

## 2018-03-23 DIAGNOSIS — R1310 Dysphagia, unspecified: Secondary | ICD-10-CM | POA: Diagnosis not present

## 2018-03-24 DIAGNOSIS — Z8673 Personal history of transient ischemic attack (TIA), and cerebral infarction without residual deficits: Secondary | ICD-10-CM | POA: Diagnosis not present

## 2018-03-24 DIAGNOSIS — R41841 Cognitive communication deficit: Secondary | ICD-10-CM | POA: Diagnosis not present

## 2018-03-24 DIAGNOSIS — R1310 Dysphagia, unspecified: Secondary | ICD-10-CM | POA: Diagnosis not present

## 2018-03-25 DIAGNOSIS — R41841 Cognitive communication deficit: Secondary | ICD-10-CM | POA: Diagnosis not present

## 2018-03-25 DIAGNOSIS — Z8673 Personal history of transient ischemic attack (TIA), and cerebral infarction without residual deficits: Secondary | ICD-10-CM | POA: Diagnosis not present

## 2018-03-25 DIAGNOSIS — R1310 Dysphagia, unspecified: Secondary | ICD-10-CM | POA: Diagnosis not present

## 2018-03-27 DIAGNOSIS — F0391 Unspecified dementia with behavioral disturbance: Secondary | ICD-10-CM | POA: Diagnosis not present

## 2018-03-28 DIAGNOSIS — Z8673 Personal history of transient ischemic attack (TIA), and cerebral infarction without residual deficits: Secondary | ICD-10-CM | POA: Diagnosis not present

## 2018-03-28 DIAGNOSIS — R41841 Cognitive communication deficit: Secondary | ICD-10-CM | POA: Diagnosis not present

## 2018-03-28 DIAGNOSIS — R1310 Dysphagia, unspecified: Secondary | ICD-10-CM | POA: Diagnosis not present

## 2018-03-29 DIAGNOSIS — R41841 Cognitive communication deficit: Secondary | ICD-10-CM | POA: Diagnosis not present

## 2018-03-29 DIAGNOSIS — R1310 Dysphagia, unspecified: Secondary | ICD-10-CM | POA: Diagnosis not present

## 2018-03-29 DIAGNOSIS — Z8673 Personal history of transient ischemic attack (TIA), and cerebral infarction without residual deficits: Secondary | ICD-10-CM | POA: Diagnosis not present

## 2018-03-30 DIAGNOSIS — R41841 Cognitive communication deficit: Secondary | ICD-10-CM | POA: Diagnosis not present

## 2018-03-30 DIAGNOSIS — Z8673 Personal history of transient ischemic attack (TIA), and cerebral infarction without residual deficits: Secondary | ICD-10-CM | POA: Diagnosis not present

## 2018-03-30 DIAGNOSIS — R1310 Dysphagia, unspecified: Secondary | ICD-10-CM | POA: Diagnosis not present

## 2018-03-31 DIAGNOSIS — R41841 Cognitive communication deficit: Secondary | ICD-10-CM | POA: Diagnosis not present

## 2018-03-31 DIAGNOSIS — Z8673 Personal history of transient ischemic attack (TIA), and cerebral infarction without residual deficits: Secondary | ICD-10-CM | POA: Diagnosis not present

## 2018-03-31 DIAGNOSIS — R1310 Dysphagia, unspecified: Secondary | ICD-10-CM | POA: Diagnosis not present

## 2018-04-03 DIAGNOSIS — Z8673 Personal history of transient ischemic attack (TIA), and cerebral infarction without residual deficits: Secondary | ICD-10-CM | POA: Diagnosis not present

## 2018-04-03 DIAGNOSIS — R41841 Cognitive communication deficit: Secondary | ICD-10-CM | POA: Diagnosis not present

## 2018-04-03 DIAGNOSIS — R1310 Dysphagia, unspecified: Secondary | ICD-10-CM | POA: Diagnosis not present

## 2018-04-04 DIAGNOSIS — R1310 Dysphagia, unspecified: Secondary | ICD-10-CM | POA: Diagnosis not present

## 2018-04-04 DIAGNOSIS — Z8673 Personal history of transient ischemic attack (TIA), and cerebral infarction without residual deficits: Secondary | ICD-10-CM | POA: Diagnosis not present

## 2018-04-04 DIAGNOSIS — R41841 Cognitive communication deficit: Secondary | ICD-10-CM | POA: Diagnosis not present

## 2018-04-05 DIAGNOSIS — R1310 Dysphagia, unspecified: Secondary | ICD-10-CM | POA: Diagnosis not present

## 2018-04-05 DIAGNOSIS — R41841 Cognitive communication deficit: Secondary | ICD-10-CM | POA: Diagnosis not present

## 2018-04-05 DIAGNOSIS — Z8673 Personal history of transient ischemic attack (TIA), and cerebral infarction without residual deficits: Secondary | ICD-10-CM | POA: Diagnosis not present

## 2018-04-06 DIAGNOSIS — R1310 Dysphagia, unspecified: Secondary | ICD-10-CM | POA: Diagnosis not present

## 2018-04-06 DIAGNOSIS — E785 Hyperlipidemia, unspecified: Secondary | ICD-10-CM | POA: Diagnosis not present

## 2018-04-06 DIAGNOSIS — I10 Essential (primary) hypertension: Secondary | ICD-10-CM | POA: Diagnosis not present

## 2018-04-06 DIAGNOSIS — Z8673 Personal history of transient ischemic attack (TIA), and cerebral infarction without residual deficits: Secondary | ICD-10-CM | POA: Diagnosis not present

## 2018-04-06 DIAGNOSIS — E119 Type 2 diabetes mellitus without complications: Secondary | ICD-10-CM | POA: Diagnosis not present

## 2018-04-06 DIAGNOSIS — L209 Atopic dermatitis, unspecified: Secondary | ICD-10-CM | POA: Diagnosis not present

## 2018-04-06 DIAGNOSIS — R41841 Cognitive communication deficit: Secondary | ICD-10-CM | POA: Diagnosis not present

## 2018-04-07 DIAGNOSIS — R41841 Cognitive communication deficit: Secondary | ICD-10-CM | POA: Diagnosis not present

## 2018-04-07 DIAGNOSIS — Z8673 Personal history of transient ischemic attack (TIA), and cerebral infarction without residual deficits: Secondary | ICD-10-CM | POA: Diagnosis not present

## 2018-04-07 DIAGNOSIS — R1310 Dysphagia, unspecified: Secondary | ICD-10-CM | POA: Diagnosis not present

## 2018-04-10 DIAGNOSIS — Z8673 Personal history of transient ischemic attack (TIA), and cerebral infarction without residual deficits: Secondary | ICD-10-CM | POA: Diagnosis not present

## 2018-04-10 DIAGNOSIS — R41841 Cognitive communication deficit: Secondary | ICD-10-CM | POA: Diagnosis not present

## 2018-04-10 DIAGNOSIS — R1310 Dysphagia, unspecified: Secondary | ICD-10-CM | POA: Diagnosis not present

## 2018-04-11 DIAGNOSIS — R1310 Dysphagia, unspecified: Secondary | ICD-10-CM | POA: Diagnosis not present

## 2018-04-11 DIAGNOSIS — R41841 Cognitive communication deficit: Secondary | ICD-10-CM | POA: Diagnosis not present

## 2018-04-11 DIAGNOSIS — Z8673 Personal history of transient ischemic attack (TIA), and cerebral infarction without residual deficits: Secondary | ICD-10-CM | POA: Diagnosis not present

## 2018-04-12 DIAGNOSIS — R41841 Cognitive communication deficit: Secondary | ICD-10-CM | POA: Diagnosis not present

## 2018-04-12 DIAGNOSIS — R1310 Dysphagia, unspecified: Secondary | ICD-10-CM | POA: Diagnosis not present

## 2018-04-12 DIAGNOSIS — Z8673 Personal history of transient ischemic attack (TIA), and cerebral infarction without residual deficits: Secondary | ICD-10-CM | POA: Diagnosis not present

## 2018-04-13 DIAGNOSIS — R1310 Dysphagia, unspecified: Secondary | ICD-10-CM | POA: Diagnosis not present

## 2018-04-13 DIAGNOSIS — R41841 Cognitive communication deficit: Secondary | ICD-10-CM | POA: Diagnosis not present

## 2018-04-13 DIAGNOSIS — Z8673 Personal history of transient ischemic attack (TIA), and cerebral infarction without residual deficits: Secondary | ICD-10-CM | POA: Diagnosis not present

## 2018-04-14 DIAGNOSIS — R41841 Cognitive communication deficit: Secondary | ICD-10-CM | POA: Diagnosis not present

## 2018-04-14 DIAGNOSIS — E119 Type 2 diabetes mellitus without complications: Secondary | ICD-10-CM | POA: Diagnosis not present

## 2018-04-14 DIAGNOSIS — R1310 Dysphagia, unspecified: Secondary | ICD-10-CM | POA: Diagnosis not present

## 2018-04-14 DIAGNOSIS — E559 Vitamin D deficiency, unspecified: Secondary | ICD-10-CM | POA: Diagnosis not present

## 2018-04-14 DIAGNOSIS — Z79899 Other long term (current) drug therapy: Secondary | ICD-10-CM | POA: Diagnosis not present

## 2018-04-14 DIAGNOSIS — I10 Essential (primary) hypertension: Secondary | ICD-10-CM | POA: Diagnosis not present

## 2018-04-14 DIAGNOSIS — D649 Anemia, unspecified: Secondary | ICD-10-CM | POA: Diagnosis not present

## 2018-04-14 DIAGNOSIS — Z8673 Personal history of transient ischemic attack (TIA), and cerebral infarction without residual deficits: Secondary | ICD-10-CM | POA: Diagnosis not present

## 2018-04-14 DIAGNOSIS — D519 Vitamin B12 deficiency anemia, unspecified: Secondary | ICD-10-CM | POA: Diagnosis not present

## 2018-04-14 DIAGNOSIS — E039 Hypothyroidism, unspecified: Secondary | ICD-10-CM | POA: Diagnosis not present

## 2018-04-14 DIAGNOSIS — E7889 Other lipoprotein metabolism disorders: Secondary | ICD-10-CM | POA: Diagnosis not present

## 2018-04-14 DIAGNOSIS — E785 Hyperlipidemia, unspecified: Secondary | ICD-10-CM | POA: Diagnosis not present

## 2018-04-17 DIAGNOSIS — R1310 Dysphagia, unspecified: Secondary | ICD-10-CM | POA: Diagnosis not present

## 2018-04-17 DIAGNOSIS — E119 Type 2 diabetes mellitus without complications: Secondary | ICD-10-CM | POA: Diagnosis not present

## 2018-04-17 DIAGNOSIS — R41841 Cognitive communication deficit: Secondary | ICD-10-CM | POA: Diagnosis not present

## 2018-04-17 DIAGNOSIS — D649 Anemia, unspecified: Secondary | ICD-10-CM | POA: Diagnosis not present

## 2018-04-17 DIAGNOSIS — Z8673 Personal history of transient ischemic attack (TIA), and cerebral infarction without residual deficits: Secondary | ICD-10-CM | POA: Diagnosis not present

## 2018-04-17 DIAGNOSIS — E039 Hypothyroidism, unspecified: Secondary | ICD-10-CM | POA: Diagnosis not present

## 2018-04-18 DIAGNOSIS — R41841 Cognitive communication deficit: Secondary | ICD-10-CM | POA: Diagnosis not present

## 2018-04-18 DIAGNOSIS — Z8673 Personal history of transient ischemic attack (TIA), and cerebral infarction without residual deficits: Secondary | ICD-10-CM | POA: Diagnosis not present

## 2018-04-18 DIAGNOSIS — R1310 Dysphagia, unspecified: Secondary | ICD-10-CM | POA: Diagnosis not present

## 2018-04-19 DIAGNOSIS — R1310 Dysphagia, unspecified: Secondary | ICD-10-CM | POA: Diagnosis not present

## 2018-04-19 DIAGNOSIS — Z8673 Personal history of transient ischemic attack (TIA), and cerebral infarction without residual deficits: Secondary | ICD-10-CM | POA: Diagnosis not present

## 2018-04-19 DIAGNOSIS — R41841 Cognitive communication deficit: Secondary | ICD-10-CM | POA: Diagnosis not present

## 2018-04-21 DIAGNOSIS — E039 Hypothyroidism, unspecified: Secondary | ICD-10-CM | POA: Diagnosis not present

## 2018-04-21 DIAGNOSIS — R41841 Cognitive communication deficit: Secondary | ICD-10-CM | POA: Diagnosis not present

## 2018-04-21 DIAGNOSIS — D649 Anemia, unspecified: Secondary | ICD-10-CM | POA: Diagnosis not present

## 2018-04-21 DIAGNOSIS — D519 Vitamin B12 deficiency anemia, unspecified: Secondary | ICD-10-CM | POA: Diagnosis not present

## 2018-04-21 DIAGNOSIS — E119 Type 2 diabetes mellitus without complications: Secondary | ICD-10-CM | POA: Diagnosis not present

## 2018-04-21 DIAGNOSIS — Z8673 Personal history of transient ischemic attack (TIA), and cerebral infarction without residual deficits: Secondary | ICD-10-CM | POA: Diagnosis not present

## 2018-04-21 DIAGNOSIS — E785 Hyperlipidemia, unspecified: Secondary | ICD-10-CM | POA: Diagnosis not present

## 2018-04-21 DIAGNOSIS — R1310 Dysphagia, unspecified: Secondary | ICD-10-CM | POA: Diagnosis not present

## 2018-04-22 DIAGNOSIS — R1310 Dysphagia, unspecified: Secondary | ICD-10-CM | POA: Diagnosis not present

## 2018-04-22 DIAGNOSIS — R41841 Cognitive communication deficit: Secondary | ICD-10-CM | POA: Diagnosis not present

## 2018-04-22 DIAGNOSIS — Z8673 Personal history of transient ischemic attack (TIA), and cerebral infarction without residual deficits: Secondary | ICD-10-CM | POA: Diagnosis not present

## 2018-04-24 DIAGNOSIS — Z8673 Personal history of transient ischemic attack (TIA), and cerebral infarction without residual deficits: Secondary | ICD-10-CM | POA: Diagnosis not present

## 2018-04-24 DIAGNOSIS — D649 Anemia, unspecified: Secondary | ICD-10-CM | POA: Diagnosis not present

## 2018-04-24 DIAGNOSIS — E119 Type 2 diabetes mellitus without complications: Secondary | ICD-10-CM | POA: Diagnosis not present

## 2018-04-24 DIAGNOSIS — K2970 Gastritis, unspecified, without bleeding: Secondary | ICD-10-CM | POA: Diagnosis not present

## 2018-04-24 DIAGNOSIS — R41841 Cognitive communication deficit: Secondary | ICD-10-CM | POA: Diagnosis not present

## 2018-04-24 DIAGNOSIS — R1310 Dysphagia, unspecified: Secondary | ICD-10-CM | POA: Diagnosis not present

## 2018-04-25 DIAGNOSIS — Z8673 Personal history of transient ischemic attack (TIA), and cerebral infarction without residual deficits: Secondary | ICD-10-CM | POA: Diagnosis not present

## 2018-04-25 DIAGNOSIS — R1310 Dysphagia, unspecified: Secondary | ICD-10-CM | POA: Diagnosis not present

## 2018-04-25 DIAGNOSIS — R41841 Cognitive communication deficit: Secondary | ICD-10-CM | POA: Diagnosis not present

## 2018-04-26 DIAGNOSIS — Z8673 Personal history of transient ischemic attack (TIA), and cerebral infarction without residual deficits: Secondary | ICD-10-CM | POA: Diagnosis not present

## 2018-04-26 DIAGNOSIS — R1310 Dysphagia, unspecified: Secondary | ICD-10-CM | POA: Diagnosis not present

## 2018-04-26 DIAGNOSIS — R41841 Cognitive communication deficit: Secondary | ICD-10-CM | POA: Diagnosis not present

## 2018-04-27 DIAGNOSIS — R1310 Dysphagia, unspecified: Secondary | ICD-10-CM | POA: Diagnosis not present

## 2018-04-27 DIAGNOSIS — Z8673 Personal history of transient ischemic attack (TIA), and cerebral infarction without residual deficits: Secondary | ICD-10-CM | POA: Diagnosis not present

## 2018-04-27 DIAGNOSIS — R41841 Cognitive communication deficit: Secondary | ICD-10-CM | POA: Diagnosis not present

## 2018-04-29 DIAGNOSIS — R1310 Dysphagia, unspecified: Secondary | ICD-10-CM | POA: Diagnosis not present

## 2018-04-29 DIAGNOSIS — Z8673 Personal history of transient ischemic attack (TIA), and cerebral infarction without residual deficits: Secondary | ICD-10-CM | POA: Diagnosis not present

## 2018-04-29 DIAGNOSIS — R41841 Cognitive communication deficit: Secondary | ICD-10-CM | POA: Diagnosis not present

## 2018-05-01 ENCOUNTER — Ambulatory Visit: Payer: Medicare Other | Admitting: Nurse Practitioner

## 2018-05-01 DIAGNOSIS — E119 Type 2 diabetes mellitus without complications: Secondary | ICD-10-CM | POA: Diagnosis not present

## 2018-05-03 DIAGNOSIS — E1151 Type 2 diabetes mellitus with diabetic peripheral angiopathy without gangrene: Secondary | ICD-10-CM | POA: Diagnosis not present

## 2018-05-04 DIAGNOSIS — R1310 Dysphagia, unspecified: Secondary | ICD-10-CM | POA: Diagnosis not present

## 2018-05-04 DIAGNOSIS — R41841 Cognitive communication deficit: Secondary | ICD-10-CM | POA: Diagnosis not present

## 2018-05-04 DIAGNOSIS — Z8673 Personal history of transient ischemic attack (TIA), and cerebral infarction without residual deficits: Secondary | ICD-10-CM | POA: Diagnosis not present

## 2018-05-05 DIAGNOSIS — Z8673 Personal history of transient ischemic attack (TIA), and cerebral infarction without residual deficits: Secondary | ICD-10-CM | POA: Diagnosis not present

## 2018-05-05 DIAGNOSIS — R41841 Cognitive communication deficit: Secondary | ICD-10-CM | POA: Diagnosis not present

## 2018-05-05 DIAGNOSIS — R1310 Dysphagia, unspecified: Secondary | ICD-10-CM | POA: Diagnosis not present

## 2018-05-06 DIAGNOSIS — R41841 Cognitive communication deficit: Secondary | ICD-10-CM | POA: Diagnosis not present

## 2018-05-06 DIAGNOSIS — R1310 Dysphagia, unspecified: Secondary | ICD-10-CM | POA: Diagnosis not present

## 2018-05-06 DIAGNOSIS — Z8673 Personal history of transient ischemic attack (TIA), and cerebral infarction without residual deficits: Secondary | ICD-10-CM | POA: Diagnosis not present

## 2018-05-07 DIAGNOSIS — R1310 Dysphagia, unspecified: Secondary | ICD-10-CM | POA: Diagnosis not present

## 2018-05-07 DIAGNOSIS — R41841 Cognitive communication deficit: Secondary | ICD-10-CM | POA: Diagnosis not present

## 2018-05-07 DIAGNOSIS — Z8673 Personal history of transient ischemic attack (TIA), and cerebral infarction without residual deficits: Secondary | ICD-10-CM | POA: Diagnosis not present

## 2018-05-08 DIAGNOSIS — E119 Type 2 diabetes mellitus without complications: Secondary | ICD-10-CM | POA: Diagnosis not present

## 2018-05-09 DIAGNOSIS — R41841 Cognitive communication deficit: Secondary | ICD-10-CM | POA: Diagnosis not present

## 2018-05-09 DIAGNOSIS — Z8673 Personal history of transient ischemic attack (TIA), and cerebral infarction without residual deficits: Secondary | ICD-10-CM | POA: Diagnosis not present

## 2018-05-09 DIAGNOSIS — R1310 Dysphagia, unspecified: Secondary | ICD-10-CM | POA: Diagnosis not present

## 2018-05-10 DIAGNOSIS — R41841 Cognitive communication deficit: Secondary | ICD-10-CM | POA: Diagnosis not present

## 2018-05-10 DIAGNOSIS — R1310 Dysphagia, unspecified: Secondary | ICD-10-CM | POA: Diagnosis not present

## 2018-05-10 DIAGNOSIS — Z8673 Personal history of transient ischemic attack (TIA), and cerebral infarction without residual deficits: Secondary | ICD-10-CM | POA: Diagnosis not present

## 2018-05-13 DIAGNOSIS — Z8673 Personal history of transient ischemic attack (TIA), and cerebral infarction without residual deficits: Secondary | ICD-10-CM | POA: Diagnosis not present

## 2018-05-13 DIAGNOSIS — R41841 Cognitive communication deficit: Secondary | ICD-10-CM | POA: Diagnosis not present

## 2018-05-13 DIAGNOSIS — R1310 Dysphagia, unspecified: Secondary | ICD-10-CM | POA: Diagnosis not present

## 2018-05-14 DIAGNOSIS — R41841 Cognitive communication deficit: Secondary | ICD-10-CM | POA: Diagnosis not present

## 2018-05-14 DIAGNOSIS — Z8673 Personal history of transient ischemic attack (TIA), and cerebral infarction without residual deficits: Secondary | ICD-10-CM | POA: Diagnosis not present

## 2018-05-14 DIAGNOSIS — R1310 Dysphagia, unspecified: Secondary | ICD-10-CM | POA: Diagnosis not present

## 2018-05-16 DIAGNOSIS — H04123 Dry eye syndrome of bilateral lacrimal glands: Secondary | ICD-10-CM | POA: Diagnosis not present

## 2018-05-16 DIAGNOSIS — H01009 Unspecified blepharitis unspecified eye, unspecified eyelid: Secondary | ICD-10-CM | POA: Diagnosis not present

## 2018-05-16 DIAGNOSIS — H353131 Nonexudative age-related macular degeneration, bilateral, early dry stage: Secondary | ICD-10-CM | POA: Diagnosis not present

## 2018-05-16 DIAGNOSIS — E113293 Type 2 diabetes mellitus with mild nonproliferative diabetic retinopathy without macular edema, bilateral: Secondary | ICD-10-CM | POA: Diagnosis not present

## 2018-05-20 DIAGNOSIS — R1310 Dysphagia, unspecified: Secondary | ICD-10-CM | POA: Diagnosis not present

## 2018-05-20 DIAGNOSIS — Z8673 Personal history of transient ischemic attack (TIA), and cerebral infarction without residual deficits: Secondary | ICD-10-CM | POA: Diagnosis not present

## 2018-05-20 DIAGNOSIS — R41841 Cognitive communication deficit: Secondary | ICD-10-CM | POA: Diagnosis not present

## 2018-05-21 DIAGNOSIS — R41841 Cognitive communication deficit: Secondary | ICD-10-CM | POA: Diagnosis not present

## 2018-05-21 DIAGNOSIS — R1310 Dysphagia, unspecified: Secondary | ICD-10-CM | POA: Diagnosis not present

## 2018-05-21 DIAGNOSIS — Z8673 Personal history of transient ischemic attack (TIA), and cerebral infarction without residual deficits: Secondary | ICD-10-CM | POA: Diagnosis not present

## 2018-05-22 DIAGNOSIS — E119 Type 2 diabetes mellitus without complications: Secondary | ICD-10-CM | POA: Diagnosis not present

## 2018-05-22 DIAGNOSIS — Z8673 Personal history of transient ischemic attack (TIA), and cerebral infarction without residual deficits: Secondary | ICD-10-CM | POA: Diagnosis not present

## 2018-05-22 DIAGNOSIS — R1310 Dysphagia, unspecified: Secondary | ICD-10-CM | POA: Diagnosis not present

## 2018-05-22 DIAGNOSIS — R41841 Cognitive communication deficit: Secondary | ICD-10-CM | POA: Diagnosis not present

## 2018-05-24 DIAGNOSIS — Z8673 Personal history of transient ischemic attack (TIA), and cerebral infarction without residual deficits: Secondary | ICD-10-CM | POA: Diagnosis not present

## 2018-05-24 DIAGNOSIS — R41841 Cognitive communication deficit: Secondary | ICD-10-CM | POA: Diagnosis not present

## 2018-05-24 DIAGNOSIS — R1310 Dysphagia, unspecified: Secondary | ICD-10-CM | POA: Diagnosis not present

## 2018-05-27 DIAGNOSIS — R41841 Cognitive communication deficit: Secondary | ICD-10-CM | POA: Diagnosis not present

## 2018-05-27 DIAGNOSIS — Z8673 Personal history of transient ischemic attack (TIA), and cerebral infarction without residual deficits: Secondary | ICD-10-CM | POA: Diagnosis not present

## 2018-05-27 DIAGNOSIS — R1310 Dysphagia, unspecified: Secondary | ICD-10-CM | POA: Diagnosis not present

## 2018-05-28 DIAGNOSIS — R41841 Cognitive communication deficit: Secondary | ICD-10-CM | POA: Diagnosis not present

## 2018-05-28 DIAGNOSIS — Z8673 Personal history of transient ischemic attack (TIA), and cerebral infarction without residual deficits: Secondary | ICD-10-CM | POA: Diagnosis not present

## 2018-05-28 DIAGNOSIS — R1310 Dysphagia, unspecified: Secondary | ICD-10-CM | POA: Diagnosis not present

## 2018-05-29 ENCOUNTER — Ambulatory Visit (INDEPENDENT_AMBULATORY_CARE_PROVIDER_SITE_OTHER): Payer: Medicare Other | Admitting: Nurse Practitioner

## 2018-05-29 ENCOUNTER — Encounter: Payer: Self-pay | Admitting: Nurse Practitioner

## 2018-05-29 VITALS — BP 112/58 | HR 94 | Temp 97.7°F | Ht 64.0 in | Wt 97.0 lb

## 2018-05-29 DIAGNOSIS — Z8673 Personal history of transient ischemic attack (TIA), and cerebral infarction without residual deficits: Secondary | ICD-10-CM | POA: Diagnosis not present

## 2018-05-29 DIAGNOSIS — R41841 Cognitive communication deficit: Secondary | ICD-10-CM | POA: Diagnosis not present

## 2018-05-29 DIAGNOSIS — K922 Gastrointestinal hemorrhage, unspecified: Secondary | ICD-10-CM | POA: Diagnosis not present

## 2018-05-29 DIAGNOSIS — D649 Anemia, unspecified: Secondary | ICD-10-CM

## 2018-05-29 DIAGNOSIS — K219 Gastro-esophageal reflux disease without esophagitis: Secondary | ICD-10-CM | POA: Insufficient documentation

## 2018-05-29 DIAGNOSIS — R1310 Dysphagia, unspecified: Secondary | ICD-10-CM | POA: Diagnosis not present

## 2018-05-29 NOTE — Progress Notes (Signed)
Referring Provider: Jani Gravel, MD Primary Care Physician:  Jani Gravel, MD Primary GI:  Dr. Gala Romney  Chief Complaint  Patient presents with  . gi bleed    doing ok    HPI:   [name] is a 84 y.o. female who presents for follow-up on GI bleed.  The patient was last seen in our office 02/22/2018 for the same as well as anemia.  She was initially referred by the emergency department where the patient presented with complaints of coffee-ground emesis and black stool per the nursing home.  The family had not noted any of this.  Review of systems not performed due to dementia caveat.  Rectal exam of brown stool which was heme-negative.  Hemoglobin mildly suppressed at 11.8.  During the 4 hours in her emergency room stay there is no witnessed GI bleed.  Her granddaughter is a Marine scientist at the facility who notes a history of stomach cancer and partial gastrectomy.  She typically has large bowel movements and she does not feel like the facility takes her to the bathroom often enough which likely caused her nausea and vomiting.  Family knows nothing of coffee-ground emesis, melena.  Her daughter had not seen any hematochezia or signs of abdominal pain.  No worsening weakness or fatigue.  History of hemorrhoids.  Recommended CBC, iron, ferritin.  Follow-up in 3 months.  Previously order lab results were provided by the facility including B12 which was normal at 543, iron low at 32, saturation low at 9.5%, ferritin low at 07.62, folic acid normal.  It appears the patient has been started on ferrous sulfate 325 mg once a day in addition to vitamin C for anemia.  Today the patient is accompanied by her granddaughter.  Today they state she's doing ok overall. Agree she's been started on iron. No indications of abdominal pain, doing wel on Zofran and Protonix. She was also switched to a puree diet which has helped. Denies any hematochezia, melena, vomiting per her granddaughter. Granddaughter feels her  grandma's facility doesn't communicate well. Doesn't appear any more weakness, fatigue than normal. No other indications of GI problems.  Patient currently on bid Protonix, gettign ready to change to once daily.  Past Medical History:  Diagnosis Date  . Arthritis   . Coronary atherosclerosis of native coronary artery    four-vessel CABG, 7/06, ... normal Cardiolite; EF 79%, 8/10  . Dementia ([place])   . Depression   . Dysarthria   . Dysphagia   . GERD (gastroesophageal reflux disease)   . GIB (gastrointestinal bleeding)    history of  . Hypothyroidism 06/14/2009  . Nontraumatic intracerebral hemorrhage ([place])   . Osteoarthritis   . Psychosis ([place])   . PUD (peptic ulcer disease)   . Pure hypercholesterolemia   . Shoulder pain   . Stroke [facility])    TIA  . Stromal tumor of the stomach [facility])    history of. partial gastrectomy 2001  . Type II or unspecified type diabetes mellitus without mention of complication, not stated as uncontrolled   . Unspecified essential hypertension   . Unspecified transient cerebral ischemia   . Unsteadiness on feet   . Vitamin D deficiency     Past Surgical History:  Procedure Laterality Date  . ABDOMINAL HYSTERECTOMY    . CATARACT EXTRACTION    . CORONARY ARTERY BYPASS GRAFT     . four-vessel CABG, 7/06, ... normal Cardiolite; EF 79%, 8/10  . gallbladder resection    .  [place]  . partial Thyroid surgery      Current Outpatient Medications  Medication Sig Dispense Refill  . albuterol (PROVENTIL) (2.5 MG/3ML) 0.083% nebulizer solution Take 2.5 mg by nebulization every 6 (six) hours as needed for wheezing or shortness of breath.    Marland Kitchen amLODipine (NORVASC) 2.5 MG tablet Take 2.5 mg by mouth daily.    . Calcium 500 MG tablet Take 500 mg by mouth 2 (two) times daily.     . cholecalciferol (VITAMIN D) 1000 units tablet Take 1,000 Units by mouth daily.    . clopidogrel (PLAVIX) 75 MG tablet Take 75 mg by mouth daily.       . Cranberry 475 MG CAPS Take 475 mg by mouth daily.    . divalproex (DEPAKOTE SPRINKLE) 125 MG capsule Take by mouth 2 (two) times daily.    . DULoxetine (CYMBALTA) 30 MG capsule Take 30 mg by mouth daily.    . ferrous sulfate 325 (65 FE) MG tablet Take 325 mg by mouth daily.    . isosorbide mononitrate (IMDUR) 30 MG 24 hr tablet Take 30 mg by mouth daily.    Marland Kitchen levothyroxine (SYNTHROID, LEVOTHROID) 75 MCG tablet Take 75 mcg by mouth daily before breakfast.    . lisinopril (PRINIVIL,ZESTRIL) 10 MG tablet Take 10 mg by mouth daily.     Marland Kitchen loratadine (CLARITIN) 10 MG tablet Take 10 mg by mouth daily.    . metFORMIN (GLUCOPHAGE) 500 MG tablet Take 500 mg by mouth 2 (two) times daily.     . Multiple Vitamins-Minerals (CENTRUM SILVER PO) Take 1 tablet by mouth daily.     . Omega-3 Fatty Acids (FISH OIL) 1000 MG CAPS Take 1,000 mg by mouth daily.    . ondansetron (ZOFRAN) 4 MG tablet Take 1 tablet (4 mg total) by mouth every 6 (six) hours. 8 tablet 0  . pantoprazole (PROTONIX) 20 MG tablet Take 20 mg by mouth 2 (two) times daily.    . polycarbophil (FIBERCON) 625 MG tablet Take 625 mg by mouth daily.    . polyethylene glycol powder (GLYCOLAX/MIRALAX) powder Take 17 g by mouth 2 (two) times daily.     . Polyvinyl Alcohol-Povidone (FRESHKOTE) 2.7-2 % SOLN Apply 1 drop to eye 3 (three) times daily.    . predniSONE (DELTASONE) 10 MG tablet Take 10 mg by mouth daily.    . Probiotic Product (PROBIOTIC DAILY PO) Take by mouth daily.    . shark liver oil-cocoa butter (PREPARATION H) 0.25-3-85.5 % suppository Place 1 suppository rectally as needed.     . vitamin C (ASCORBIC ACID) 500 MG tablet Take 500 mg by mouth daily.    . famotidine (PEPCID) 20 MG tablet Take 1 tablet (20 mg total) by mouth 2 (two) times daily. (Patient not taking: Reported on 05/29/2018) 20 tablet 0  . insulin glargine (LANTUS) 100 UNIT/ML injection Inject 15 Units into the skin at bedtime.      No current facility-administered  medications for this visit.     Allergies as of 05/29/2018 - Review Complete 05/29/2018  Allergen Reaction Noted  . Codeine Hives   . Contrast media [iodinated diagnostic agents] Hives 02/27/2011  . Sulfa antibiotics Hives 02/27/2011  . Guaifenesin & derivatives  02/22/2014  . Iodine  02/22/2014  . Macrobid [nitrofurantoin macrocrystal]  01/19/2018  . Adhesive [tape] Rash 03/07/2013  . Latex Rash 03/07/2013    Family History  Problem Relation Age of Onset  . Heart attack  Brother 67  . Coronary artery disease Unknown   . Hypertension Unknown     Social History   Socioeconomic History  . Marital status: Widowed    Spouse name: Not on file  . Number of children: Not on file  . Years of education: Not on file  . Highest education level: Not on file  Occupational History  . Occupation: RETIRED  Social Needs  . Financial resource strain: Not on file  . Food insecurity:    Worry: Not on file    Inability: Not on file  . Transportation needs:    Medical: Not on file    Non-medical: Not on file  Tobacco Use  . Smoking status: Never Smoker  . Smokeless tobacco: Never Used  Substance and Sexual Activity  . Alcohol use: No  . Drug use: No  . Sexual activity: Not Currently  Lifestyle  . Physical activity:    Days per week: Not on file    Minutes per session: Not on file  . Stress: Not on file  Relationships  . Social connections:    Talks on phone: Not on file    Gets together: Not on file    Attends religious service: Not on file    Active member of club or organization: Not on file    Attends meetings of clubs or organizations: Not on file    Relationship status: Not on file  Other Topics Concern  . Not on file  Social History Narrative  . Not on file    Review of Systems: General: Negative for anorexia, weight loss, fever, chills, fatigue, weakness. Eyes: Negative for vision changes.  ENT: Negative for hoarseness, difficulty swallowing , nasal  congestion. CV: Negative for chest pain, angina, palpitations, dyspnea on exertion, peripheral edema.  Respiratory: Negative for dyspnea at rest, dyspnea on exertion, cough, sputum, wheezing.  GI: See history of present illness. GU:  Negative for dysuria, hematuria, urinary incontinence, urinary frequency, nocturnal urination.  MS: Negative for joint pain, low back pain.  Derm: Negative for rash or itching.  Neuro: Negative for weakness, abnormal sensation, seizure, frequent headaches, memory loss, confusion.  Psych: Negative for anxiety, depression, suicidal ideation, hallucinations.  Endo: Negative for unusual weight change.  Heme: Negative for bruising or bleeding. Allergy: Negative for rash or hives.   Physical Exam: BP (!) 112/58   Pulse 94   Temp 97.7 F (36.5 C) (Oral)   Ht 5\' 4"  (1.626 m)   Wt 97 lb (44 kg) Comment: per nursing home record  BMI 16.65 kg/m  General:   Alert and oriented. Pleasant and cooperative. Well-nourished and well-developed.  Head:  Normocephalic and atraumatic. Eyes:  Without icterus, sclera clear and conjunctiva pink.  Ears:  Normal auditory acuity. Mouth:  No deformity or lesions, oral mucosa pink.  Throat/Neck:  Supple, without mass or thyromegaly. Cardiovascular:  S1, S2 present without murmurs appreciated. Normal pulses noted. Extremities without clubbing or edema. Respiratory:  Clear to auscultation bilaterally. No wheezes, rales, or rhonchi. No distress.  Gastrointestinal:  +BS, soft, non-tender and non-distended. No HSM noted. No guarding or rebound. No masses appreciated.  Rectal:  Deferred  Musculoskalatal:  Symmetrical without gross deformities. Normal posture. Skin:  Intact without significant lesions or rashes. Neurologic:  Alert and oriented x4;  grossly normal neurologically. Psych:  Alert and cooperative. Normal mood and affect. Heme/Lymph/Immune: No significant cervical adenopathy. No excessive bruising noted.    05/29/2018 9:47  AM   Disclaimer: This note was dictated with  voice recognition software. Similar sounding words can inadvertently be transcribed and may not be corrected upon review.

## 2018-05-29 NOTE — Patient Instructions (Signed)
1. Continue taking your current medications. 2. Return for follow-up as needed for any GI issues. 3. Call us if you have any questions or concerns.  At [facility] we value your feedback. You may receive a survey about your visit today. Please share your experience as we strive to create trusting relationships with our patients to provide genuine, compassionate, quality care.  We appreciate your understanding and patience as we review any laboratory studies, imaging, and other diagnostic tests that are ordered as we care for you. Our office policy is 5 business days for review of these results, and any emergent or urgent results are addressed in a timely manner for your best interest. If you do not hear from our office in 1 week, please contact us.   We also encourage the use of MyChart, which contains your medical information for your review as well. If you are not enrolled in this feature, an access code is on this after visit summary for your convenience. Thank you for allowing [place] to be involved in your care.  It was great to see you today!  I hope you have a Merry Christmas!!

## 2018-05-29 NOTE — Assessment & Plan Note (Signed)
Still no evidence of acute GI bleed.  The patient is mildly anemic with iron deficiency.  She has been started on iron.  She is 84 years old and her granddaughter states they do not want anything invasive at this time.  Further recommendations can be made based on any clinical changes.  Recommend she continue her current medications and follow-up as needed.

## 2018-05-29 NOTE — Assessment & Plan Note (Signed)
Documented anemia with a hemoglobin of 11.8, low ferritin.  Vitamin B12 and folate normal.  Likely mild iron deficiency anemia.  The patient is 84 years old and no invasive procedures warranted or requested at this time.  Recommend she continue her current medications including iron supplementation.  Follow-up as needed.

## 2018-05-29 NOTE — Assessment & Plan Note (Signed)
GERD symptoms seem to be doing well.  No further nausea or vomiting.  She is currently on schedule to switch from twice daily PPI to once daily.  I discussed with the patient's granddaughter that if her reflux worsens they can switch her back to twice daily.  Otherwise continue other medications and follow-up as needed.

## 2018-05-29 NOTE — Progress Notes (Signed)
CC'D TO PCP °

## 2018-05-30 DIAGNOSIS — Z8673 Personal history of transient ischemic attack (TIA), and cerebral infarction without residual deficits: Secondary | ICD-10-CM | POA: Diagnosis not present

## 2018-05-30 DIAGNOSIS — R41841 Cognitive communication deficit: Secondary | ICD-10-CM | POA: Diagnosis not present

## 2018-05-30 DIAGNOSIS — R1310 Dysphagia, unspecified: Secondary | ICD-10-CM | POA: Diagnosis not present

## 2018-05-31 DIAGNOSIS — E039 Hypothyroidism, unspecified: Secondary | ICD-10-CM | POA: Diagnosis not present

## 2018-05-31 DIAGNOSIS — I679 Cerebrovascular disease, unspecified: Secondary | ICD-10-CM | POA: Diagnosis not present

## 2018-05-31 DIAGNOSIS — F329 Major depressive disorder, single episode, unspecified: Secondary | ICD-10-CM | POA: Diagnosis not present

## 2018-06-01 DIAGNOSIS — R41841 Cognitive communication deficit: Secondary | ICD-10-CM | POA: Diagnosis not present

## 2018-06-01 DIAGNOSIS — R1310 Dysphagia, unspecified: Secondary | ICD-10-CM | POA: Diagnosis not present

## 2018-06-01 DIAGNOSIS — Z8673 Personal history of transient ischemic attack (TIA), and cerebral infarction without residual deficits: Secondary | ICD-10-CM | POA: Diagnosis not present

## 2018-06-03 DIAGNOSIS — E039 Hypothyroidism, unspecified: Secondary | ICD-10-CM | POA: Diagnosis not present

## 2018-06-03 DIAGNOSIS — Z5181 Encounter for therapeutic drug level monitoring: Secondary | ICD-10-CM | POA: Diagnosis not present

## 2018-06-03 DIAGNOSIS — R41841 Cognitive communication deficit: Secondary | ICD-10-CM | POA: Diagnosis not present

## 2018-06-03 DIAGNOSIS — R1310 Dysphagia, unspecified: Secondary | ICD-10-CM | POA: Diagnosis not present

## 2018-06-03 DIAGNOSIS — Z8673 Personal history of transient ischemic attack (TIA), and cerebral infarction without residual deficits: Secondary | ICD-10-CM | POA: Diagnosis not present

## 2018-06-04 DIAGNOSIS — Z8673 Personal history of transient ischemic attack (TIA), and cerebral infarction without residual deficits: Secondary | ICD-10-CM | POA: Diagnosis not present

## 2018-06-04 DIAGNOSIS — R41841 Cognitive communication deficit: Secondary | ICD-10-CM | POA: Diagnosis not present

## 2018-06-04 DIAGNOSIS — R1310 Dysphagia, unspecified: Secondary | ICD-10-CM | POA: Diagnosis not present

## 2018-06-05 DIAGNOSIS — R41841 Cognitive communication deficit: Secondary | ICD-10-CM | POA: Diagnosis not present

## 2018-06-05 DIAGNOSIS — E039 Hypothyroidism, unspecified: Secondary | ICD-10-CM | POA: Diagnosis not present

## 2018-06-05 DIAGNOSIS — I10 Essential (primary) hypertension: Secondary | ICD-10-CM | POA: Diagnosis not present

## 2018-06-05 DIAGNOSIS — Z8673 Personal history of transient ischemic attack (TIA), and cerebral infarction without residual deficits: Secondary | ICD-10-CM | POA: Diagnosis not present

## 2018-06-05 DIAGNOSIS — R1310 Dysphagia, unspecified: Secondary | ICD-10-CM | POA: Diagnosis not present

## 2018-06-06 DIAGNOSIS — Z8673 Personal history of transient ischemic attack (TIA), and cerebral infarction without residual deficits: Secondary | ICD-10-CM | POA: Diagnosis not present

## 2018-06-06 DIAGNOSIS — R41841 Cognitive communication deficit: Secondary | ICD-10-CM | POA: Diagnosis not present

## 2018-06-06 DIAGNOSIS — R1310 Dysphagia, unspecified: Secondary | ICD-10-CM | POA: Diagnosis not present

## 2018-06-09 DIAGNOSIS — Z8673 Personal history of transient ischemic attack (TIA), and cerebral infarction without residual deficits: Secondary | ICD-10-CM | POA: Diagnosis not present

## 2018-06-09 DIAGNOSIS — R1310 Dysphagia, unspecified: Secondary | ICD-10-CM | POA: Diagnosis not present

## 2018-06-09 DIAGNOSIS — R41841 Cognitive communication deficit: Secondary | ICD-10-CM | POA: Diagnosis not present

## 2018-06-10 DIAGNOSIS — Z8673 Personal history of transient ischemic attack (TIA), and cerebral infarction without residual deficits: Secondary | ICD-10-CM | POA: Diagnosis not present

## 2018-06-10 DIAGNOSIS — R41841 Cognitive communication deficit: Secondary | ICD-10-CM | POA: Diagnosis not present

## 2018-06-10 DIAGNOSIS — R1310 Dysphagia, unspecified: Secondary | ICD-10-CM | POA: Diagnosis not present

## 2018-06-11 DIAGNOSIS — R41841 Cognitive communication deficit: Secondary | ICD-10-CM | POA: Diagnosis not present

## 2018-06-11 DIAGNOSIS — Z8673 Personal history of transient ischemic attack (TIA), and cerebral infarction without residual deficits: Secondary | ICD-10-CM | POA: Diagnosis not present

## 2018-06-11 DIAGNOSIS — R1310 Dysphagia, unspecified: Secondary | ICD-10-CM | POA: Diagnosis not present

## 2018-06-12 DIAGNOSIS — I10 Essential (primary) hypertension: Secondary | ICD-10-CM | POA: Diagnosis not present

## 2018-06-12 DIAGNOSIS — Z8673 Personal history of transient ischemic attack (TIA), and cerebral infarction without residual deficits: Secondary | ICD-10-CM | POA: Diagnosis not present

## 2018-06-12 DIAGNOSIS — R1310 Dysphagia, unspecified: Secondary | ICD-10-CM | POA: Diagnosis not present

## 2018-06-12 DIAGNOSIS — R41841 Cognitive communication deficit: Secondary | ICD-10-CM | POA: Diagnosis not present

## 2018-06-13 DIAGNOSIS — R1310 Dysphagia, unspecified: Secondary | ICD-10-CM | POA: Diagnosis not present

## 2018-06-13 DIAGNOSIS — R41841 Cognitive communication deficit: Secondary | ICD-10-CM | POA: Diagnosis not present

## 2018-06-13 DIAGNOSIS — Z8673 Personal history of transient ischemic attack (TIA), and cerebral infarction without residual deficits: Secondary | ICD-10-CM | POA: Diagnosis not present

## 2018-06-14 DIAGNOSIS — R1310 Dysphagia, unspecified: Secondary | ICD-10-CM | POA: Diagnosis not present

## 2018-06-14 DIAGNOSIS — Z8673 Personal history of transient ischemic attack (TIA), and cerebral infarction without residual deficits: Secondary | ICD-10-CM | POA: Diagnosis not present

## 2018-06-14 DIAGNOSIS — R41841 Cognitive communication deficit: Secondary | ICD-10-CM | POA: Diagnosis not present

## 2018-06-15 DIAGNOSIS — R41841 Cognitive communication deficit: Secondary | ICD-10-CM | POA: Diagnosis not present

## 2018-06-15 DIAGNOSIS — R1310 Dysphagia, unspecified: Secondary | ICD-10-CM | POA: Diagnosis not present

## 2018-06-15 DIAGNOSIS — Z8673 Personal history of transient ischemic attack (TIA), and cerebral infarction without residual deficits: Secondary | ICD-10-CM | POA: Diagnosis not present

## 2018-06-24 DIAGNOSIS — F331 Major depressive disorder, recurrent, moderate: Secondary | ICD-10-CM | POA: Diagnosis not present

## 2018-06-26 DIAGNOSIS — I10 Essential (primary) hypertension: Secondary | ICD-10-CM | POA: Diagnosis not present

## 2018-07-11 DIAGNOSIS — L209 Atopic dermatitis, unspecified: Secondary | ICD-10-CM | POA: Diagnosis not present

## 2018-07-11 DIAGNOSIS — F0391 Unspecified dementia with behavioral disturbance: Secondary | ICD-10-CM | POA: Diagnosis not present

## 2018-07-13 DIAGNOSIS — E119 Type 2 diabetes mellitus without complications: Secondary | ICD-10-CM | POA: Diagnosis not present

## 2018-07-13 DIAGNOSIS — E785 Hyperlipidemia, unspecified: Secondary | ICD-10-CM | POA: Diagnosis not present

## 2018-07-13 DIAGNOSIS — D649 Anemia, unspecified: Secondary | ICD-10-CM | POA: Diagnosis not present

## 2018-07-13 DIAGNOSIS — E039 Hypothyroidism, unspecified: Secondary | ICD-10-CM | POA: Diagnosis not present

## 2018-07-19 DIAGNOSIS — Z5181 Encounter for therapeutic drug level monitoring: Secondary | ICD-10-CM | POA: Diagnosis not present

## 2018-07-19 DIAGNOSIS — E039 Hypothyroidism, unspecified: Secondary | ICD-10-CM | POA: Diagnosis not present

## 2018-07-20 DIAGNOSIS — E039 Hypothyroidism, unspecified: Secondary | ICD-10-CM | POA: Diagnosis not present

## 2018-07-26 DIAGNOSIS — L84 Corns and callosities: Secondary | ICD-10-CM | POA: Diagnosis not present

## 2018-07-26 DIAGNOSIS — E1151 Type 2 diabetes mellitus with diabetic peripheral angiopathy without gangrene: Secondary | ICD-10-CM | POA: Diagnosis not present

## 2018-08-15 DIAGNOSIS — R5381 Other malaise: Secondary | ICD-10-CM | POA: Diagnosis not present

## 2018-08-15 DIAGNOSIS — R4182 Altered mental status, unspecified: Secondary | ICD-10-CM | POA: Diagnosis not present

## 2018-08-25 DIAGNOSIS — F331 Major depressive disorder, recurrent, moderate: Secondary | ICD-10-CM | POA: Diagnosis not present

## 2018-08-25 DIAGNOSIS — F321 Major depressive disorder, single episode, moderate: Secondary | ICD-10-CM | POA: Diagnosis not present

## 2018-08-25 DIAGNOSIS — R4189 Other symptoms and signs involving cognitive functions and awareness: Secondary | ICD-10-CM | POA: Diagnosis not present

## 2018-08-28 DIAGNOSIS — R5381 Other malaise: Secondary | ICD-10-CM | POA: Diagnosis not present

## 2018-08-28 DIAGNOSIS — H1033 Unspecified acute conjunctivitis, bilateral: Secondary | ICD-10-CM | POA: Diagnosis not present

## 2018-09-04 DIAGNOSIS — E039 Hypothyroidism, unspecified: Secondary | ICD-10-CM | POA: Diagnosis not present

## 2018-09-04 DIAGNOSIS — H1033 Unspecified acute conjunctivitis, bilateral: Secondary | ICD-10-CM | POA: Diagnosis not present

## 2018-09-06 DIAGNOSIS — Z8673 Personal history of transient ischemic attack (TIA), and cerebral infarction without residual deficits: Secondary | ICD-10-CM | POA: Diagnosis not present

## 2018-09-06 DIAGNOSIS — I10 Essential (primary) hypertension: Secondary | ICD-10-CM | POA: Diagnosis not present

## 2018-09-06 DIAGNOSIS — I679 Cerebrovascular disease, unspecified: Secondary | ICD-10-CM | POA: Diagnosis not present

## 2018-09-06 DIAGNOSIS — I2510 Atherosclerotic heart disease of native coronary artery without angina pectoris: Secondary | ICD-10-CM | POA: Diagnosis not present

## 2018-09-06 DIAGNOSIS — D509 Iron deficiency anemia, unspecified: Secondary | ICD-10-CM | POA: Diagnosis not present

## 2018-09-06 DIAGNOSIS — R41841 Cognitive communication deficit: Secondary | ICD-10-CM | POA: Diagnosis not present

## 2018-09-06 DIAGNOSIS — R471 Dysarthria and anarthria: Secondary | ICD-10-CM | POA: Diagnosis not present

## 2018-09-06 DIAGNOSIS — E119 Type 2 diabetes mellitus without complications: Secondary | ICD-10-CM | POA: Diagnosis not present

## 2018-09-06 DIAGNOSIS — F0390 Unspecified dementia without behavioral disturbance: Secondary | ICD-10-CM | POA: Diagnosis not present

## 2018-09-06 DIAGNOSIS — R1312 Dysphagia, oropharyngeal phase: Secondary | ICD-10-CM | POA: Diagnosis not present

## 2018-09-07 DIAGNOSIS — F0390 Unspecified dementia without behavioral disturbance: Secondary | ICD-10-CM | POA: Diagnosis not present

## 2018-09-07 DIAGNOSIS — Z8673 Personal history of transient ischemic attack (TIA), and cerebral infarction without residual deficits: Secondary | ICD-10-CM | POA: Diagnosis not present

## 2018-09-07 DIAGNOSIS — R471 Dysarthria and anarthria: Secondary | ICD-10-CM | POA: Diagnosis not present

## 2018-09-07 DIAGNOSIS — R41841 Cognitive communication deficit: Secondary | ICD-10-CM | POA: Diagnosis not present

## 2018-09-07 DIAGNOSIS — R1312 Dysphagia, oropharyngeal phase: Secondary | ICD-10-CM | POA: Diagnosis not present

## 2018-09-07 DIAGNOSIS — E119 Type 2 diabetes mellitus without complications: Secondary | ICD-10-CM | POA: Diagnosis not present

## 2018-09-08 DIAGNOSIS — E039 Hypothyroidism, unspecified: Secondary | ICD-10-CM | POA: Diagnosis not present

## 2018-09-08 DIAGNOSIS — E119 Type 2 diabetes mellitus without complications: Secondary | ICD-10-CM | POA: Diagnosis not present

## 2018-09-08 DIAGNOSIS — Z8673 Personal history of transient ischemic attack (TIA), and cerebral infarction without residual deficits: Secondary | ICD-10-CM | POA: Diagnosis not present

## 2018-09-08 DIAGNOSIS — R1312 Dysphagia, oropharyngeal phase: Secondary | ICD-10-CM | POA: Diagnosis not present

## 2018-09-08 DIAGNOSIS — R471 Dysarthria and anarthria: Secondary | ICD-10-CM | POA: Diagnosis not present

## 2018-09-08 DIAGNOSIS — R41841 Cognitive communication deficit: Secondary | ICD-10-CM | POA: Diagnosis not present

## 2018-09-08 DIAGNOSIS — F0390 Unspecified dementia without behavioral disturbance: Secondary | ICD-10-CM | POA: Diagnosis not present

## 2018-09-11 DIAGNOSIS — R1312 Dysphagia, oropharyngeal phase: Secondary | ICD-10-CM | POA: Diagnosis not present

## 2018-09-11 DIAGNOSIS — F0390 Unspecified dementia without behavioral disturbance: Secondary | ICD-10-CM | POA: Diagnosis not present

## 2018-09-11 DIAGNOSIS — Z8673 Personal history of transient ischemic attack (TIA), and cerebral infarction without residual deficits: Secondary | ICD-10-CM | POA: Diagnosis not present

## 2018-09-11 DIAGNOSIS — E039 Hypothyroidism, unspecified: Secondary | ICD-10-CM | POA: Diagnosis not present

## 2018-09-11 DIAGNOSIS — R471 Dysarthria and anarthria: Secondary | ICD-10-CM | POA: Diagnosis not present

## 2018-09-11 DIAGNOSIS — F0391 Unspecified dementia with behavioral disturbance: Secondary | ICD-10-CM | POA: Diagnosis not present

## 2018-09-11 DIAGNOSIS — R41841 Cognitive communication deficit: Secondary | ICD-10-CM | POA: Diagnosis not present

## 2018-09-11 DIAGNOSIS — E119 Type 2 diabetes mellitus without complications: Secondary | ICD-10-CM | POA: Diagnosis not present

## 2018-09-12 DIAGNOSIS — Z8673 Personal history of transient ischemic attack (TIA), and cerebral infarction without residual deficits: Secondary | ICD-10-CM | POA: Diagnosis not present

## 2018-09-12 DIAGNOSIS — R471 Dysarthria and anarthria: Secondary | ICD-10-CM | POA: Diagnosis not present

## 2018-09-12 DIAGNOSIS — R1312 Dysphagia, oropharyngeal phase: Secondary | ICD-10-CM | POA: Diagnosis not present

## 2018-09-12 DIAGNOSIS — E119 Type 2 diabetes mellitus without complications: Secondary | ICD-10-CM | POA: Diagnosis not present

## 2018-09-12 DIAGNOSIS — R41841 Cognitive communication deficit: Secondary | ICD-10-CM | POA: Diagnosis not present

## 2018-09-12 DIAGNOSIS — F0390 Unspecified dementia without behavioral disturbance: Secondary | ICD-10-CM | POA: Diagnosis not present

## 2018-09-13 DIAGNOSIS — R41841 Cognitive communication deficit: Secondary | ICD-10-CM | POA: Diagnosis not present

## 2018-09-13 DIAGNOSIS — M6281 Muscle weakness (generalized): Secondary | ICD-10-CM | POA: Diagnosis not present

## 2018-09-13 DIAGNOSIS — E039 Hypothyroidism, unspecified: Secondary | ICD-10-CM | POA: Diagnosis not present

## 2018-09-13 DIAGNOSIS — F0390 Unspecified dementia without behavioral disturbance: Secondary | ICD-10-CM | POA: Diagnosis not present

## 2018-09-13 DIAGNOSIS — E119 Type 2 diabetes mellitus without complications: Secondary | ICD-10-CM | POA: Diagnosis not present

## 2018-09-13 DIAGNOSIS — R1312 Dysphagia, oropharyngeal phase: Secondary | ICD-10-CM | POA: Diagnosis not present

## 2018-09-13 DIAGNOSIS — Z8673 Personal history of transient ischemic attack (TIA), and cerebral infarction without residual deficits: Secondary | ICD-10-CM | POA: Diagnosis not present

## 2018-09-13 DIAGNOSIS — R471 Dysarthria and anarthria: Secondary | ICD-10-CM | POA: Diagnosis not present

## 2018-09-14 DIAGNOSIS — R41841 Cognitive communication deficit: Secondary | ICD-10-CM | POA: Diagnosis not present

## 2018-09-14 DIAGNOSIS — F0391 Unspecified dementia with behavioral disturbance: Secondary | ICD-10-CM | POA: Diagnosis not present

## 2018-09-14 DIAGNOSIS — E119 Type 2 diabetes mellitus without complications: Secondary | ICD-10-CM | POA: Diagnosis not present

## 2018-09-14 DIAGNOSIS — R471 Dysarthria and anarthria: Secondary | ICD-10-CM | POA: Diagnosis not present

## 2018-09-14 DIAGNOSIS — F0390 Unspecified dementia without behavioral disturbance: Secondary | ICD-10-CM | POA: Diagnosis not present

## 2018-09-14 DIAGNOSIS — M6281 Muscle weakness (generalized): Secondary | ICD-10-CM | POA: Diagnosis not present

## 2018-09-14 DIAGNOSIS — R1312 Dysphagia, oropharyngeal phase: Secondary | ICD-10-CM | POA: Diagnosis not present

## 2018-09-14 DIAGNOSIS — E039 Hypothyroidism, unspecified: Secondary | ICD-10-CM | POA: Diagnosis not present

## 2018-09-15 DIAGNOSIS — M6281 Muscle weakness (generalized): Secondary | ICD-10-CM | POA: Diagnosis not present

## 2018-09-15 DIAGNOSIS — R1312 Dysphagia, oropharyngeal phase: Secondary | ICD-10-CM | POA: Diagnosis not present

## 2018-09-15 DIAGNOSIS — E119 Type 2 diabetes mellitus without complications: Secondary | ICD-10-CM | POA: Diagnosis not present

## 2018-09-15 DIAGNOSIS — F0390 Unspecified dementia without behavioral disturbance: Secondary | ICD-10-CM | POA: Diagnosis not present

## 2018-09-15 DIAGNOSIS — R41841 Cognitive communication deficit: Secondary | ICD-10-CM | POA: Diagnosis not present

## 2018-09-15 DIAGNOSIS — R471 Dysarthria and anarthria: Secondary | ICD-10-CM | POA: Diagnosis not present

## 2018-09-17 DIAGNOSIS — F0390 Unspecified dementia without behavioral disturbance: Secondary | ICD-10-CM | POA: Diagnosis not present

## 2018-09-17 DIAGNOSIS — R471 Dysarthria and anarthria: Secondary | ICD-10-CM | POA: Diagnosis not present

## 2018-09-17 DIAGNOSIS — E119 Type 2 diabetes mellitus without complications: Secondary | ICD-10-CM | POA: Diagnosis not present

## 2018-09-17 DIAGNOSIS — R41841 Cognitive communication deficit: Secondary | ICD-10-CM | POA: Diagnosis not present

## 2018-09-17 DIAGNOSIS — M6281 Muscle weakness (generalized): Secondary | ICD-10-CM | POA: Diagnosis not present

## 2018-09-17 DIAGNOSIS — R1312 Dysphagia, oropharyngeal phase: Secondary | ICD-10-CM | POA: Diagnosis not present

## 2018-09-19 DIAGNOSIS — R471 Dysarthria and anarthria: Secondary | ICD-10-CM | POA: Diagnosis not present

## 2018-09-19 DIAGNOSIS — E119 Type 2 diabetes mellitus without complications: Secondary | ICD-10-CM | POA: Diagnosis not present

## 2018-09-19 DIAGNOSIS — M6281 Muscle weakness (generalized): Secondary | ICD-10-CM | POA: Diagnosis not present

## 2018-09-19 DIAGNOSIS — R41841 Cognitive communication deficit: Secondary | ICD-10-CM | POA: Diagnosis not present

## 2018-09-19 DIAGNOSIS — F0390 Unspecified dementia without behavioral disturbance: Secondary | ICD-10-CM | POA: Diagnosis not present

## 2018-09-19 DIAGNOSIS — R1312 Dysphagia, oropharyngeal phase: Secondary | ICD-10-CM | POA: Diagnosis not present

## 2018-09-20 DIAGNOSIS — R41841 Cognitive communication deficit: Secondary | ICD-10-CM | POA: Diagnosis not present

## 2018-09-20 DIAGNOSIS — F0390 Unspecified dementia without behavioral disturbance: Secondary | ICD-10-CM | POA: Diagnosis not present

## 2018-09-20 DIAGNOSIS — M6281 Muscle weakness (generalized): Secondary | ICD-10-CM | POA: Diagnosis not present

## 2018-09-20 DIAGNOSIS — R1312 Dysphagia, oropharyngeal phase: Secondary | ICD-10-CM | POA: Diagnosis not present

## 2018-09-20 DIAGNOSIS — R471 Dysarthria and anarthria: Secondary | ICD-10-CM | POA: Diagnosis not present

## 2018-09-20 DIAGNOSIS — E119 Type 2 diabetes mellitus without complications: Secondary | ICD-10-CM | POA: Diagnosis not present

## 2018-09-21 DIAGNOSIS — M6281 Muscle weakness (generalized): Secondary | ICD-10-CM | POA: Diagnosis not present

## 2018-09-21 DIAGNOSIS — R471 Dysarthria and anarthria: Secondary | ICD-10-CM | POA: Diagnosis not present

## 2018-09-21 DIAGNOSIS — E119 Type 2 diabetes mellitus without complications: Secondary | ICD-10-CM | POA: Diagnosis not present

## 2018-09-21 DIAGNOSIS — R41841 Cognitive communication deficit: Secondary | ICD-10-CM | POA: Diagnosis not present

## 2018-09-21 DIAGNOSIS — R1312 Dysphagia, oropharyngeal phase: Secondary | ICD-10-CM | POA: Diagnosis not present

## 2018-09-21 DIAGNOSIS — F0390 Unspecified dementia without behavioral disturbance: Secondary | ICD-10-CM | POA: Diagnosis not present

## 2018-09-22 DIAGNOSIS — M6281 Muscle weakness (generalized): Secondary | ICD-10-CM | POA: Diagnosis not present

## 2018-09-22 DIAGNOSIS — F0390 Unspecified dementia without behavioral disturbance: Secondary | ICD-10-CM | POA: Diagnosis not present

## 2018-09-22 DIAGNOSIS — R41841 Cognitive communication deficit: Secondary | ICD-10-CM | POA: Diagnosis not present

## 2018-09-22 DIAGNOSIS — E119 Type 2 diabetes mellitus without complications: Secondary | ICD-10-CM | POA: Diagnosis not present

## 2018-09-22 DIAGNOSIS — R471 Dysarthria and anarthria: Secondary | ICD-10-CM | POA: Diagnosis not present

## 2018-09-22 DIAGNOSIS — R1312 Dysphagia, oropharyngeal phase: Secondary | ICD-10-CM | POA: Diagnosis not present

## 2018-09-25 DIAGNOSIS — R1312 Dysphagia, oropharyngeal phase: Secondary | ICD-10-CM | POA: Diagnosis not present

## 2018-09-25 DIAGNOSIS — R41841 Cognitive communication deficit: Secondary | ICD-10-CM | POA: Diagnosis not present

## 2018-09-25 DIAGNOSIS — R471 Dysarthria and anarthria: Secondary | ICD-10-CM | POA: Diagnosis not present

## 2018-09-25 DIAGNOSIS — F0390 Unspecified dementia without behavioral disturbance: Secondary | ICD-10-CM | POA: Diagnosis not present

## 2018-09-25 DIAGNOSIS — M6281 Muscle weakness (generalized): Secondary | ICD-10-CM | POA: Diagnosis not present

## 2018-09-25 DIAGNOSIS — E119 Type 2 diabetes mellitus without complications: Secondary | ICD-10-CM | POA: Diagnosis not present

## 2018-09-26 DIAGNOSIS — E119 Type 2 diabetes mellitus without complications: Secondary | ICD-10-CM | POA: Diagnosis not present

## 2018-09-26 DIAGNOSIS — R41841 Cognitive communication deficit: Secondary | ICD-10-CM | POA: Diagnosis not present

## 2018-09-26 DIAGNOSIS — R1312 Dysphagia, oropharyngeal phase: Secondary | ICD-10-CM | POA: Diagnosis not present

## 2018-09-26 DIAGNOSIS — M6281 Muscle weakness (generalized): Secondary | ICD-10-CM | POA: Diagnosis not present

## 2018-09-26 DIAGNOSIS — R471 Dysarthria and anarthria: Secondary | ICD-10-CM | POA: Diagnosis not present

## 2018-09-26 DIAGNOSIS — F0390 Unspecified dementia without behavioral disturbance: Secondary | ICD-10-CM | POA: Diagnosis not present

## 2018-09-27 DIAGNOSIS — R41841 Cognitive communication deficit: Secondary | ICD-10-CM | POA: Diagnosis not present

## 2018-09-27 DIAGNOSIS — R1312 Dysphagia, oropharyngeal phase: Secondary | ICD-10-CM | POA: Diagnosis not present

## 2018-09-27 DIAGNOSIS — M6281 Muscle weakness (generalized): Secondary | ICD-10-CM | POA: Diagnosis not present

## 2018-09-27 DIAGNOSIS — E119 Type 2 diabetes mellitus without complications: Secondary | ICD-10-CM | POA: Diagnosis not present

## 2018-09-27 DIAGNOSIS — F0390 Unspecified dementia without behavioral disturbance: Secondary | ICD-10-CM | POA: Diagnosis not present

## 2018-09-27 DIAGNOSIS — R471 Dysarthria and anarthria: Secondary | ICD-10-CM | POA: Diagnosis not present

## 2018-09-28 DIAGNOSIS — F0390 Unspecified dementia without behavioral disturbance: Secondary | ICD-10-CM | POA: Diagnosis not present

## 2018-09-28 DIAGNOSIS — M6281 Muscle weakness (generalized): Secondary | ICD-10-CM | POA: Diagnosis not present

## 2018-09-28 DIAGNOSIS — R41841 Cognitive communication deficit: Secondary | ICD-10-CM | POA: Diagnosis not present

## 2018-09-28 DIAGNOSIS — R471 Dysarthria and anarthria: Secondary | ICD-10-CM | POA: Diagnosis not present

## 2018-09-28 DIAGNOSIS — R1312 Dysphagia, oropharyngeal phase: Secondary | ICD-10-CM | POA: Diagnosis not present

## 2018-09-28 DIAGNOSIS — E119 Type 2 diabetes mellitus without complications: Secondary | ICD-10-CM | POA: Diagnosis not present

## 2018-09-29 DIAGNOSIS — F0390 Unspecified dementia without behavioral disturbance: Secondary | ICD-10-CM | POA: Diagnosis not present

## 2018-09-29 DIAGNOSIS — R1312 Dysphagia, oropharyngeal phase: Secondary | ICD-10-CM | POA: Diagnosis not present

## 2018-09-29 DIAGNOSIS — R41841 Cognitive communication deficit: Secondary | ICD-10-CM | POA: Diagnosis not present

## 2018-09-29 DIAGNOSIS — E119 Type 2 diabetes mellitus without complications: Secondary | ICD-10-CM | POA: Diagnosis not present

## 2018-09-29 DIAGNOSIS — M6281 Muscle weakness (generalized): Secondary | ICD-10-CM | POA: Diagnosis not present

## 2018-09-29 DIAGNOSIS — R471 Dysarthria and anarthria: Secondary | ICD-10-CM | POA: Diagnosis not present

## 2018-10-02 DIAGNOSIS — R1312 Dysphagia, oropharyngeal phase: Secondary | ICD-10-CM | POA: Diagnosis not present

## 2018-10-02 DIAGNOSIS — M6281 Muscle weakness (generalized): Secondary | ICD-10-CM | POA: Diagnosis not present

## 2018-10-02 DIAGNOSIS — R41841 Cognitive communication deficit: Secondary | ICD-10-CM | POA: Diagnosis not present

## 2018-10-02 DIAGNOSIS — E119 Type 2 diabetes mellitus without complications: Secondary | ICD-10-CM | POA: Diagnosis not present

## 2018-10-02 DIAGNOSIS — F0390 Unspecified dementia without behavioral disturbance: Secondary | ICD-10-CM | POA: Diagnosis not present

## 2018-10-02 DIAGNOSIS — R471 Dysarthria and anarthria: Secondary | ICD-10-CM | POA: Diagnosis not present

## 2018-10-03 DIAGNOSIS — M6281 Muscle weakness (generalized): Secondary | ICD-10-CM | POA: Diagnosis not present

## 2018-10-03 DIAGNOSIS — F0390 Unspecified dementia without behavioral disturbance: Secondary | ICD-10-CM | POA: Diagnosis not present

## 2018-10-03 DIAGNOSIS — R1312 Dysphagia, oropharyngeal phase: Secondary | ICD-10-CM | POA: Diagnosis not present

## 2018-10-03 DIAGNOSIS — R41841 Cognitive communication deficit: Secondary | ICD-10-CM | POA: Diagnosis not present

## 2018-10-03 DIAGNOSIS — E119 Type 2 diabetes mellitus without complications: Secondary | ICD-10-CM | POA: Diagnosis not present

## 2018-10-03 DIAGNOSIS — R471 Dysarthria and anarthria: Secondary | ICD-10-CM | POA: Diagnosis not present

## 2018-10-04 DIAGNOSIS — R471 Dysarthria and anarthria: Secondary | ICD-10-CM | POA: Diagnosis not present

## 2018-10-04 DIAGNOSIS — R1312 Dysphagia, oropharyngeal phase: Secondary | ICD-10-CM | POA: Diagnosis not present

## 2018-10-04 DIAGNOSIS — E119 Type 2 diabetes mellitus without complications: Secondary | ICD-10-CM | POA: Diagnosis not present

## 2018-10-04 DIAGNOSIS — M6281 Muscle weakness (generalized): Secondary | ICD-10-CM | POA: Diagnosis not present

## 2018-10-04 DIAGNOSIS — R41841 Cognitive communication deficit: Secondary | ICD-10-CM | POA: Diagnosis not present

## 2018-10-04 DIAGNOSIS — F0390 Unspecified dementia without behavioral disturbance: Secondary | ICD-10-CM | POA: Diagnosis not present

## 2018-10-05 DIAGNOSIS — R41841 Cognitive communication deficit: Secondary | ICD-10-CM | POA: Diagnosis not present

## 2018-10-05 DIAGNOSIS — E119 Type 2 diabetes mellitus without complications: Secondary | ICD-10-CM | POA: Diagnosis not present

## 2018-10-05 DIAGNOSIS — M6281 Muscle weakness (generalized): Secondary | ICD-10-CM | POA: Diagnosis not present

## 2018-10-05 DIAGNOSIS — F0390 Unspecified dementia without behavioral disturbance: Secondary | ICD-10-CM | POA: Diagnosis not present

## 2018-10-05 DIAGNOSIS — R471 Dysarthria and anarthria: Secondary | ICD-10-CM | POA: Diagnosis not present

## 2018-10-05 DIAGNOSIS — R1312 Dysphagia, oropharyngeal phase: Secondary | ICD-10-CM | POA: Diagnosis not present

## 2018-10-06 DIAGNOSIS — F331 Major depressive disorder, recurrent, moderate: Secondary | ICD-10-CM | POA: Diagnosis not present

## 2018-10-06 DIAGNOSIS — R4189 Other symptoms and signs involving cognitive functions and awareness: Secondary | ICD-10-CM | POA: Diagnosis not present

## 2018-10-07 DIAGNOSIS — E039 Hypothyroidism, unspecified: Secondary | ICD-10-CM | POA: Diagnosis not present

## 2018-10-07 DIAGNOSIS — Z5181 Encounter for therapeutic drug level monitoring: Secondary | ICD-10-CM | POA: Diagnosis not present

## 2018-10-09 DIAGNOSIS — R1312 Dysphagia, oropharyngeal phase: Secondary | ICD-10-CM | POA: Diagnosis not present

## 2018-10-09 DIAGNOSIS — E039 Hypothyroidism, unspecified: Secondary | ICD-10-CM | POA: Diagnosis not present

## 2018-10-09 DIAGNOSIS — R41841 Cognitive communication deficit: Secondary | ICD-10-CM | POA: Diagnosis not present

## 2018-10-09 DIAGNOSIS — E119 Type 2 diabetes mellitus without complications: Secondary | ICD-10-CM | POA: Diagnosis not present

## 2018-10-09 DIAGNOSIS — F0390 Unspecified dementia without behavioral disturbance: Secondary | ICD-10-CM | POA: Diagnosis not present

## 2018-10-09 DIAGNOSIS — M6281 Muscle weakness (generalized): Secondary | ICD-10-CM | POA: Diagnosis not present

## 2018-10-09 DIAGNOSIS — R471 Dysarthria and anarthria: Secondary | ICD-10-CM | POA: Diagnosis not present

## 2018-10-10 DIAGNOSIS — R41841 Cognitive communication deficit: Secondary | ICD-10-CM | POA: Diagnosis not present

## 2018-10-10 DIAGNOSIS — R471 Dysarthria and anarthria: Secondary | ICD-10-CM | POA: Diagnosis not present

## 2018-10-10 DIAGNOSIS — R1312 Dysphagia, oropharyngeal phase: Secondary | ICD-10-CM | POA: Diagnosis not present

## 2018-10-10 DIAGNOSIS — F0390 Unspecified dementia without behavioral disturbance: Secondary | ICD-10-CM | POA: Diagnosis not present

## 2018-10-10 DIAGNOSIS — M6281 Muscle weakness (generalized): Secondary | ICD-10-CM | POA: Diagnosis not present

## 2018-10-10 DIAGNOSIS — E119 Type 2 diabetes mellitus without complications: Secondary | ICD-10-CM | POA: Diagnosis not present

## 2018-10-11 DIAGNOSIS — R1312 Dysphagia, oropharyngeal phase: Secondary | ICD-10-CM | POA: Diagnosis not present

## 2018-10-11 DIAGNOSIS — R41841 Cognitive communication deficit: Secondary | ICD-10-CM | POA: Diagnosis not present

## 2018-10-11 DIAGNOSIS — F0390 Unspecified dementia without behavioral disturbance: Secondary | ICD-10-CM | POA: Diagnosis not present

## 2018-10-11 DIAGNOSIS — R471 Dysarthria and anarthria: Secondary | ICD-10-CM | POA: Diagnosis not present

## 2018-10-11 DIAGNOSIS — E119 Type 2 diabetes mellitus without complications: Secondary | ICD-10-CM | POA: Diagnosis not present

## 2018-10-11 DIAGNOSIS — M6281 Muscle weakness (generalized): Secondary | ICD-10-CM | POA: Diagnosis not present

## 2018-10-12 DIAGNOSIS — R471 Dysarthria and anarthria: Secondary | ICD-10-CM | POA: Diagnosis not present

## 2018-10-12 DIAGNOSIS — M6281 Muscle weakness (generalized): Secondary | ICD-10-CM | POA: Diagnosis not present

## 2018-10-12 DIAGNOSIS — R41841 Cognitive communication deficit: Secondary | ICD-10-CM | POA: Diagnosis not present

## 2018-10-12 DIAGNOSIS — E119 Type 2 diabetes mellitus without complications: Secondary | ICD-10-CM | POA: Diagnosis not present

## 2018-10-12 DIAGNOSIS — F0390 Unspecified dementia without behavioral disturbance: Secondary | ICD-10-CM | POA: Diagnosis not present

## 2018-10-12 DIAGNOSIS — R1312 Dysphagia, oropharyngeal phase: Secondary | ICD-10-CM | POA: Diagnosis not present

## 2018-10-13 DIAGNOSIS — R471 Dysarthria and anarthria: Secondary | ICD-10-CM | POA: Diagnosis not present

## 2018-10-13 DIAGNOSIS — F0390 Unspecified dementia without behavioral disturbance: Secondary | ICD-10-CM | POA: Diagnosis not present

## 2018-10-13 DIAGNOSIS — S62231S Other displaced fracture of base of first metacarpal bone, right hand, sequela: Secondary | ICD-10-CM | POA: Diagnosis not present

## 2018-10-13 DIAGNOSIS — R293 Abnormal posture: Secondary | ICD-10-CM | POA: Diagnosis not present

## 2018-10-13 DIAGNOSIS — R41841 Cognitive communication deficit: Secondary | ICD-10-CM | POA: Diagnosis not present

## 2018-10-13 DIAGNOSIS — R1312 Dysphagia, oropharyngeal phase: Secondary | ICD-10-CM | POA: Diagnosis not present

## 2018-10-13 DIAGNOSIS — Z8673 Personal history of transient ischemic attack (TIA), and cerebral infarction without residual deficits: Secondary | ICD-10-CM | POA: Diagnosis not present

## 2018-10-13 DIAGNOSIS — M6281 Muscle weakness (generalized): Secondary | ICD-10-CM | POA: Diagnosis not present

## 2018-10-13 DIAGNOSIS — E119 Type 2 diabetes mellitus without complications: Secondary | ICD-10-CM | POA: Diagnosis not present

## 2018-10-16 DIAGNOSIS — R293 Abnormal posture: Secondary | ICD-10-CM | POA: Diagnosis not present

## 2018-10-16 DIAGNOSIS — R41841 Cognitive communication deficit: Secondary | ICD-10-CM | POA: Diagnosis not present

## 2018-10-16 DIAGNOSIS — F0390 Unspecified dementia without behavioral disturbance: Secondary | ICD-10-CM | POA: Diagnosis not present

## 2018-10-16 DIAGNOSIS — R1312 Dysphagia, oropharyngeal phase: Secondary | ICD-10-CM | POA: Diagnosis not present

## 2018-10-16 DIAGNOSIS — E119 Type 2 diabetes mellitus without complications: Secondary | ICD-10-CM | POA: Diagnosis not present

## 2018-10-16 DIAGNOSIS — M6281 Muscle weakness (generalized): Secondary | ICD-10-CM | POA: Diagnosis not present

## 2018-10-17 DIAGNOSIS — R1312 Dysphagia, oropharyngeal phase: Secondary | ICD-10-CM | POA: Diagnosis not present

## 2018-10-17 DIAGNOSIS — M6281 Muscle weakness (generalized): Secondary | ICD-10-CM | POA: Diagnosis not present

## 2018-10-17 DIAGNOSIS — E119 Type 2 diabetes mellitus without complications: Secondary | ICD-10-CM | POA: Diagnosis not present

## 2018-10-17 DIAGNOSIS — R41841 Cognitive communication deficit: Secondary | ICD-10-CM | POA: Diagnosis not present

## 2018-10-17 DIAGNOSIS — F0390 Unspecified dementia without behavioral disturbance: Secondary | ICD-10-CM | POA: Diagnosis not present

## 2018-10-17 DIAGNOSIS — R293 Abnormal posture: Secondary | ICD-10-CM | POA: Diagnosis not present

## 2018-10-18 DIAGNOSIS — R41841 Cognitive communication deficit: Secondary | ICD-10-CM | POA: Diagnosis not present

## 2018-10-18 DIAGNOSIS — M6281 Muscle weakness (generalized): Secondary | ICD-10-CM | POA: Diagnosis not present

## 2018-10-18 DIAGNOSIS — F0390 Unspecified dementia without behavioral disturbance: Secondary | ICD-10-CM | POA: Diagnosis not present

## 2018-10-18 DIAGNOSIS — E119 Type 2 diabetes mellitus without complications: Secondary | ICD-10-CM | POA: Diagnosis not present

## 2018-10-18 DIAGNOSIS — R1312 Dysphagia, oropharyngeal phase: Secondary | ICD-10-CM | POA: Diagnosis not present

## 2018-10-18 DIAGNOSIS — R293 Abnormal posture: Secondary | ICD-10-CM | POA: Diagnosis not present

## 2018-10-19 DIAGNOSIS — E119 Type 2 diabetes mellitus without complications: Secondary | ICD-10-CM | POA: Diagnosis not present

## 2018-10-19 DIAGNOSIS — R293 Abnormal posture: Secondary | ICD-10-CM | POA: Diagnosis not present

## 2018-10-19 DIAGNOSIS — F0390 Unspecified dementia without behavioral disturbance: Secondary | ICD-10-CM | POA: Diagnosis not present

## 2018-10-19 DIAGNOSIS — M6281 Muscle weakness (generalized): Secondary | ICD-10-CM | POA: Diagnosis not present

## 2018-10-19 DIAGNOSIS — R1312 Dysphagia, oropharyngeal phase: Secondary | ICD-10-CM | POA: Diagnosis not present

## 2018-10-19 DIAGNOSIS — R41841 Cognitive communication deficit: Secondary | ICD-10-CM | POA: Diagnosis not present

## 2018-10-20 DIAGNOSIS — E119 Type 2 diabetes mellitus without complications: Secondary | ICD-10-CM | POA: Diagnosis not present

## 2018-10-20 DIAGNOSIS — F0390 Unspecified dementia without behavioral disturbance: Secondary | ICD-10-CM | POA: Diagnosis not present

## 2018-10-20 DIAGNOSIS — M6281 Muscle weakness (generalized): Secondary | ICD-10-CM | POA: Diagnosis not present

## 2018-10-20 DIAGNOSIS — R1312 Dysphagia, oropharyngeal phase: Secondary | ICD-10-CM | POA: Diagnosis not present

## 2018-10-20 DIAGNOSIS — R293 Abnormal posture: Secondary | ICD-10-CM | POA: Diagnosis not present

## 2018-10-20 DIAGNOSIS — R41841 Cognitive communication deficit: Secondary | ICD-10-CM | POA: Diagnosis not present

## 2018-10-23 DIAGNOSIS — R1312 Dysphagia, oropharyngeal phase: Secondary | ICD-10-CM | POA: Diagnosis not present

## 2018-10-23 DIAGNOSIS — M6281 Muscle weakness (generalized): Secondary | ICD-10-CM | POA: Diagnosis not present

## 2018-10-23 DIAGNOSIS — R41841 Cognitive communication deficit: Secondary | ICD-10-CM | POA: Diagnosis not present

## 2018-10-23 DIAGNOSIS — E119 Type 2 diabetes mellitus without complications: Secondary | ICD-10-CM | POA: Diagnosis not present

## 2018-10-23 DIAGNOSIS — F0390 Unspecified dementia without behavioral disturbance: Secondary | ICD-10-CM | POA: Diagnosis not present

## 2018-10-23 DIAGNOSIS — R293 Abnormal posture: Secondary | ICD-10-CM | POA: Diagnosis not present

## 2018-10-24 DIAGNOSIS — R41841 Cognitive communication deficit: Secondary | ICD-10-CM | POA: Diagnosis not present

## 2018-10-24 DIAGNOSIS — R1312 Dysphagia, oropharyngeal phase: Secondary | ICD-10-CM | POA: Diagnosis not present

## 2018-10-24 DIAGNOSIS — E119 Type 2 diabetes mellitus without complications: Secondary | ICD-10-CM | POA: Diagnosis not present

## 2018-10-24 DIAGNOSIS — R293 Abnormal posture: Secondary | ICD-10-CM | POA: Diagnosis not present

## 2018-10-24 DIAGNOSIS — F0390 Unspecified dementia without behavioral disturbance: Secondary | ICD-10-CM | POA: Diagnosis not present

## 2018-10-24 DIAGNOSIS — M6281 Muscle weakness (generalized): Secondary | ICD-10-CM | POA: Diagnosis not present

## 2018-10-25 DIAGNOSIS — M6281 Muscle weakness (generalized): Secondary | ICD-10-CM | POA: Diagnosis not present

## 2018-10-25 DIAGNOSIS — R1312 Dysphagia, oropharyngeal phase: Secondary | ICD-10-CM | POA: Diagnosis not present

## 2018-10-25 DIAGNOSIS — R293 Abnormal posture: Secondary | ICD-10-CM | POA: Diagnosis not present

## 2018-10-25 DIAGNOSIS — R41841 Cognitive communication deficit: Secondary | ICD-10-CM | POA: Diagnosis not present

## 2018-10-25 DIAGNOSIS — E119 Type 2 diabetes mellitus without complications: Secondary | ICD-10-CM | POA: Diagnosis not present

## 2018-10-25 DIAGNOSIS — F0390 Unspecified dementia without behavioral disturbance: Secondary | ICD-10-CM | POA: Diagnosis not present

## 2018-10-26 DIAGNOSIS — R293 Abnormal posture: Secondary | ICD-10-CM | POA: Diagnosis not present

## 2018-10-26 DIAGNOSIS — M6281 Muscle weakness (generalized): Secondary | ICD-10-CM | POA: Diagnosis not present

## 2018-10-26 DIAGNOSIS — F0390 Unspecified dementia without behavioral disturbance: Secondary | ICD-10-CM | POA: Diagnosis not present

## 2018-10-26 DIAGNOSIS — R41841 Cognitive communication deficit: Secondary | ICD-10-CM | POA: Diagnosis not present

## 2018-10-26 DIAGNOSIS — E119 Type 2 diabetes mellitus without complications: Secondary | ICD-10-CM | POA: Diagnosis not present

## 2018-10-26 DIAGNOSIS — R1312 Dysphagia, oropharyngeal phase: Secondary | ICD-10-CM | POA: Diagnosis not present

## 2018-10-27 DIAGNOSIS — F0390 Unspecified dementia without behavioral disturbance: Secondary | ICD-10-CM | POA: Diagnosis not present

## 2018-10-27 DIAGNOSIS — R293 Abnormal posture: Secondary | ICD-10-CM | POA: Diagnosis not present

## 2018-10-27 DIAGNOSIS — M6281 Muscle weakness (generalized): Secondary | ICD-10-CM | POA: Diagnosis not present

## 2018-10-27 DIAGNOSIS — R41841 Cognitive communication deficit: Secondary | ICD-10-CM | POA: Diagnosis not present

## 2018-10-27 DIAGNOSIS — R1312 Dysphagia, oropharyngeal phase: Secondary | ICD-10-CM | POA: Diagnosis not present

## 2018-10-27 DIAGNOSIS — E119 Type 2 diabetes mellitus without complications: Secondary | ICD-10-CM | POA: Diagnosis not present

## 2018-10-30 DIAGNOSIS — F0390 Unspecified dementia without behavioral disturbance: Secondary | ICD-10-CM | POA: Diagnosis not present

## 2018-10-30 DIAGNOSIS — M6281 Muscle weakness (generalized): Secondary | ICD-10-CM | POA: Diagnosis not present

## 2018-10-30 DIAGNOSIS — R1312 Dysphagia, oropharyngeal phase: Secondary | ICD-10-CM | POA: Diagnosis not present

## 2018-10-30 DIAGNOSIS — R41841 Cognitive communication deficit: Secondary | ICD-10-CM | POA: Diagnosis not present

## 2018-10-30 DIAGNOSIS — E119 Type 2 diabetes mellitus without complications: Secondary | ICD-10-CM | POA: Diagnosis not present

## 2018-10-30 DIAGNOSIS — R293 Abnormal posture: Secondary | ICD-10-CM | POA: Diagnosis not present

## 2018-10-31 DIAGNOSIS — R1312 Dysphagia, oropharyngeal phase: Secondary | ICD-10-CM | POA: Diagnosis not present

## 2018-10-31 DIAGNOSIS — E119 Type 2 diabetes mellitus without complications: Secondary | ICD-10-CM | POA: Diagnosis not present

## 2018-10-31 DIAGNOSIS — R41841 Cognitive communication deficit: Secondary | ICD-10-CM | POA: Diagnosis not present

## 2018-10-31 DIAGNOSIS — M6281 Muscle weakness (generalized): Secondary | ICD-10-CM | POA: Diagnosis not present

## 2018-10-31 DIAGNOSIS — F0390 Unspecified dementia without behavioral disturbance: Secondary | ICD-10-CM | POA: Diagnosis not present

## 2018-10-31 DIAGNOSIS — R293 Abnormal posture: Secondary | ICD-10-CM | POA: Diagnosis not present

## 2018-11-01 DIAGNOSIS — E119 Type 2 diabetes mellitus without complications: Secondary | ICD-10-CM | POA: Diagnosis not present

## 2018-11-01 DIAGNOSIS — F0390 Unspecified dementia without behavioral disturbance: Secondary | ICD-10-CM | POA: Diagnosis not present

## 2018-11-01 DIAGNOSIS — R1312 Dysphagia, oropharyngeal phase: Secondary | ICD-10-CM | POA: Diagnosis not present

## 2018-11-01 DIAGNOSIS — M6281 Muscle weakness (generalized): Secondary | ICD-10-CM | POA: Diagnosis not present

## 2018-11-01 DIAGNOSIS — R293 Abnormal posture: Secondary | ICD-10-CM | POA: Diagnosis not present

## 2018-11-01 DIAGNOSIS — R41841 Cognitive communication deficit: Secondary | ICD-10-CM | POA: Diagnosis not present

## 2018-11-02 DIAGNOSIS — M1611 Unilateral primary osteoarthritis, right hip: Secondary | ICD-10-CM | POA: Diagnosis not present

## 2018-11-02 DIAGNOSIS — R41841 Cognitive communication deficit: Secondary | ICD-10-CM | POA: Diagnosis not present

## 2018-11-02 DIAGNOSIS — M79631 Pain in right forearm: Secondary | ICD-10-CM | POA: Diagnosis not present

## 2018-11-02 DIAGNOSIS — F0390 Unspecified dementia without behavioral disturbance: Secondary | ICD-10-CM | POA: Diagnosis not present

## 2018-11-02 DIAGNOSIS — R1312 Dysphagia, oropharyngeal phase: Secondary | ICD-10-CM | POA: Diagnosis not present

## 2018-11-02 DIAGNOSIS — W050XXA Fall from non-moving wheelchair, initial encounter: Secondary | ICD-10-CM | POA: Diagnosis not present

## 2018-11-02 DIAGNOSIS — M6281 Muscle weakness (generalized): Secondary | ICD-10-CM | POA: Diagnosis not present

## 2018-11-02 DIAGNOSIS — R293 Abnormal posture: Secondary | ICD-10-CM | POA: Diagnosis not present

## 2018-11-02 DIAGNOSIS — S62521A Displaced fracture of distal phalanx of right thumb, initial encounter for closed fracture: Secondary | ICD-10-CM | POA: Diagnosis not present

## 2018-11-02 DIAGNOSIS — E119 Type 2 diabetes mellitus without complications: Secondary | ICD-10-CM | POA: Diagnosis not present

## 2018-11-02 DIAGNOSIS — M79651 Pain in right thigh: Secondary | ICD-10-CM | POA: Diagnosis not present

## 2018-11-03 DIAGNOSIS — R4189 Other symptoms and signs involving cognitive functions and awareness: Secondary | ICD-10-CM | POA: Diagnosis not present

## 2018-11-03 DIAGNOSIS — E119 Type 2 diabetes mellitus without complications: Secondary | ICD-10-CM | POA: Diagnosis not present

## 2018-11-03 DIAGNOSIS — R41841 Cognitive communication deficit: Secondary | ICD-10-CM | POA: Diagnosis not present

## 2018-11-03 DIAGNOSIS — F0390 Unspecified dementia without behavioral disturbance: Secondary | ICD-10-CM | POA: Diagnosis not present

## 2018-11-03 DIAGNOSIS — F331 Major depressive disorder, recurrent, moderate: Secondary | ICD-10-CM | POA: Diagnosis not present

## 2018-11-03 DIAGNOSIS — R1312 Dysphagia, oropharyngeal phase: Secondary | ICD-10-CM | POA: Diagnosis not present

## 2018-11-03 DIAGNOSIS — R293 Abnormal posture: Secondary | ICD-10-CM | POA: Diagnosis not present

## 2018-11-03 DIAGNOSIS — M6281 Muscle weakness (generalized): Secondary | ICD-10-CM | POA: Diagnosis not present

## 2018-11-07 DIAGNOSIS — F0390 Unspecified dementia without behavioral disturbance: Secondary | ICD-10-CM | POA: Diagnosis not present

## 2018-11-07 DIAGNOSIS — M6281 Muscle weakness (generalized): Secondary | ICD-10-CM | POA: Diagnosis not present

## 2018-11-07 DIAGNOSIS — E119 Type 2 diabetes mellitus without complications: Secondary | ICD-10-CM | POA: Diagnosis not present

## 2018-11-07 DIAGNOSIS — R41841 Cognitive communication deficit: Secondary | ICD-10-CM | POA: Diagnosis not present

## 2018-11-07 DIAGNOSIS — R293 Abnormal posture: Secondary | ICD-10-CM | POA: Diagnosis not present

## 2018-11-07 DIAGNOSIS — R1312 Dysphagia, oropharyngeal phase: Secondary | ICD-10-CM | POA: Diagnosis not present

## 2018-11-08 ENCOUNTER — Telehealth: Payer: Self-pay | Admitting: Orthopedic Surgery

## 2018-11-08 DIAGNOSIS — F0390 Unspecified dementia without behavioral disturbance: Secondary | ICD-10-CM | POA: Diagnosis not present

## 2018-11-08 DIAGNOSIS — E119 Type 2 diabetes mellitus without complications: Secondary | ICD-10-CM | POA: Diagnosis not present

## 2018-11-08 DIAGNOSIS — R1312 Dysphagia, oropharyngeal phase: Secondary | ICD-10-CM | POA: Diagnosis not present

## 2018-11-08 DIAGNOSIS — M6281 Muscle weakness (generalized): Secondary | ICD-10-CM | POA: Diagnosis not present

## 2018-11-08 DIAGNOSIS — R293 Abnormal posture: Secondary | ICD-10-CM | POA: Diagnosis not present

## 2018-11-08 DIAGNOSIS — R41841 Cognitive communication deficit: Secondary | ICD-10-CM | POA: Diagnosis not present

## 2018-11-08 NOTE — Telephone Encounter (Signed)
Call from transportation contact Lynita Lombard at [facility] [place], [place] [phone], requesting appointment for patient for problem of fractured thumb. Said patient fell, and mobile Xray was done. Relayed that our doctors will need copy of Xray report and physician notes for review. Also discussed Covid-19 restrictions.  Michela Pitcher will get this information faxed as soon as possible. Appointment pending.

## 2018-11-09 DIAGNOSIS — R1312 Dysphagia, oropharyngeal phase: Secondary | ICD-10-CM | POA: Diagnosis not present

## 2018-11-09 DIAGNOSIS — E119 Type 2 diabetes mellitus without complications: Secondary | ICD-10-CM | POA: Diagnosis not present

## 2018-11-09 DIAGNOSIS — R41841 Cognitive communication deficit: Secondary | ICD-10-CM | POA: Diagnosis not present

## 2018-11-09 DIAGNOSIS — E785 Hyperlipidemia, unspecified: Secondary | ICD-10-CM | POA: Diagnosis not present

## 2018-11-09 DIAGNOSIS — R293 Abnormal posture: Secondary | ICD-10-CM | POA: Diagnosis not present

## 2018-11-09 DIAGNOSIS — F0390 Unspecified dementia without behavioral disturbance: Secondary | ICD-10-CM | POA: Diagnosis not present

## 2018-11-09 DIAGNOSIS — W050XXD Fall from non-moving wheelchair, subsequent encounter: Secondary | ICD-10-CM | POA: Diagnosis not present

## 2018-11-09 DIAGNOSIS — I10 Essential (primary) hypertension: Secondary | ICD-10-CM | POA: Diagnosis not present

## 2018-11-09 DIAGNOSIS — M6281 Muscle weakness (generalized): Secondary | ICD-10-CM | POA: Diagnosis not present

## 2018-11-09 NOTE — Telephone Encounter (Signed)
Called back to [facility] with status. Records received and in Dr's box for review and advice. Aware.

## 2018-11-13 DIAGNOSIS — M6281 Muscle weakness (generalized): Secondary | ICD-10-CM | POA: Diagnosis not present

## 2018-11-13 DIAGNOSIS — S62231S Other displaced fracture of base of first metacarpal bone, right hand, sequela: Secondary | ICD-10-CM | POA: Diagnosis not present

## 2018-11-13 DIAGNOSIS — R293 Abnormal posture: Secondary | ICD-10-CM | POA: Diagnosis not present

## 2018-11-13 DIAGNOSIS — F0390 Unspecified dementia without behavioral disturbance: Secondary | ICD-10-CM | POA: Diagnosis not present

## 2018-11-13 NOTE — Telephone Encounter (Signed)
Called [facility] facility at [phone] per Dr Ruthe Mannan review and scheduled appointment for 11/15/18; spoke with Hassan Rowan; aware.

## 2018-11-14 DIAGNOSIS — D519 Vitamin B12 deficiency anemia, unspecified: Secondary | ICD-10-CM | POA: Diagnosis not present

## 2018-11-14 DIAGNOSIS — E559 Vitamin D deficiency, unspecified: Secondary | ICD-10-CM | POA: Diagnosis not present

## 2018-11-14 DIAGNOSIS — F0390 Unspecified dementia without behavioral disturbance: Secondary | ICD-10-CM | POA: Diagnosis not present

## 2018-11-14 DIAGNOSIS — R293 Abnormal posture: Secondary | ICD-10-CM | POA: Diagnosis not present

## 2018-11-14 DIAGNOSIS — D509 Iron deficiency anemia, unspecified: Secondary | ICD-10-CM | POA: Diagnosis not present

## 2018-11-14 DIAGNOSIS — E039 Hypothyroidism, unspecified: Secondary | ICD-10-CM | POA: Diagnosis not present

## 2018-11-14 DIAGNOSIS — D649 Anemia, unspecified: Secondary | ICD-10-CM | POA: Diagnosis not present

## 2018-11-14 DIAGNOSIS — E785 Hyperlipidemia, unspecified: Secondary | ICD-10-CM | POA: Diagnosis not present

## 2018-11-14 DIAGNOSIS — E119 Type 2 diabetes mellitus without complications: Secondary | ICD-10-CM | POA: Diagnosis not present

## 2018-11-14 DIAGNOSIS — S62231S Other displaced fracture of base of first metacarpal bone, right hand, sequela: Secondary | ICD-10-CM | POA: Diagnosis not present

## 2018-11-14 DIAGNOSIS — Z5181 Encounter for therapeutic drug level monitoring: Secondary | ICD-10-CM | POA: Diagnosis not present

## 2018-11-14 DIAGNOSIS — M6281 Muscle weakness (generalized): Secondary | ICD-10-CM | POA: Diagnosis not present

## 2018-11-15 ENCOUNTER — Other Ambulatory Visit: Payer: Self-pay

## 2018-11-15 ENCOUNTER — Encounter: Payer: Self-pay | Admitting: Orthopedic Surgery

## 2018-11-15 ENCOUNTER — Ambulatory Visit (INDEPENDENT_AMBULATORY_CARE_PROVIDER_SITE_OTHER): Payer: Medicare Other | Admitting: Orthopedic Surgery

## 2018-11-15 VITALS — BP 98/64 | HR 78 | Temp 98.1°F | Ht 64.0 in | Wt 97.0 lb

## 2018-11-15 DIAGNOSIS — S62524A Nondisplaced fracture of distal phalanx of right thumb, initial encounter for closed fracture: Secondary | ICD-10-CM | POA: Diagnosis not present

## 2018-11-15 DIAGNOSIS — E039 Hypothyroidism, unspecified: Secondary | ICD-10-CM | POA: Diagnosis not present

## 2018-11-15 NOTE — Patient Instructions (Signed)
Remove splint no further treatment needed

## 2018-11-15 NOTE — Progress Notes (Signed)
Chief complaint injury right hand  Patient with dementia 85 years old had some type of injury with x-rays on Nov 02, 2018 x-ray report reads acute small fracture involving the first digit distal phalanx at the base with moderate degenerative changes  Review of systems patient cannot give history no one is with her to give a history  Past Medical History:  Diagnosis Date  . Arthritis   . Coronary atherosclerosis of native coronary artery    four-vessel CABG, 7/06, ... normal Cardiolite; EF 79%, 8/10  . Dementia ([place])   . Depression   . Dysarthria   . Dysphagia   . GERD (gastroesophageal reflux disease)   . GIB (gastrointestinal bleeding)    history of  . Hypothyroidism 06/14/2009  . Nontraumatic intracerebral hemorrhage ([place])   . Osteoarthritis   . Psychosis ([place])   . PUD (peptic ulcer disease)   . Pure hypercholesterolemia   . Shoulder pain   . Stroke [facility])    TIA  . Stromal tumor of the stomach [facility])    history of. partial gastrectomy 2001  . Type II or unspecified type diabetes mellitus without mention of complication, not stated as uncontrolled   . Unspecified essential hypertension   . Unspecified transient cerebral ischemia   . Unsteadiness on feet   . Vitamin D deficiency     Physical Exam Constitutional:      General: She is not in acute distress.    Appearance: Normal appearance.  Musculoskeletal:     Comments: Right thumb degenerative changes are noted and palpable around the interphalangeal joint she has normal flexion extension with some tenderness at the base there is no malalignment color is normal the temperature is normal  Skin:    General: Skin is warm.     Capillary Refill: Capillary refill takes less than 2 seconds.  Neurological:     Mental Status: She is alert. Mental status is at baseline.  Psychiatric:        Mood and Affect: Mood normal.    I have taken a picture of the report for the chart   Encounter Diagnosis  Name Primary?  . Closed  nondisplaced fracture of distal phalanx of right thumb, initial encounter Yes    This is basically a distal phalanx fracture fracture nondisplaced  No further treatment necessary

## 2018-11-16 DIAGNOSIS — F0390 Unspecified dementia without behavioral disturbance: Secondary | ICD-10-CM | POA: Diagnosis not present

## 2018-11-16 DIAGNOSIS — S62231S Other displaced fracture of base of first metacarpal bone, right hand, sequela: Secondary | ICD-10-CM | POA: Diagnosis not present

## 2018-11-16 DIAGNOSIS — M6281 Muscle weakness (generalized): Secondary | ICD-10-CM | POA: Diagnosis not present

## 2018-11-16 DIAGNOSIS — R293 Abnormal posture: Secondary | ICD-10-CM | POA: Diagnosis not present

## 2018-11-17 DIAGNOSIS — M6281 Muscle weakness (generalized): Secondary | ICD-10-CM | POA: Diagnosis not present

## 2018-11-17 DIAGNOSIS — S62231S Other displaced fracture of base of first metacarpal bone, right hand, sequela: Secondary | ICD-10-CM | POA: Diagnosis not present

## 2018-11-17 DIAGNOSIS — R293 Abnormal posture: Secondary | ICD-10-CM | POA: Diagnosis not present

## 2018-11-17 DIAGNOSIS — F0390 Unspecified dementia without behavioral disturbance: Secondary | ICD-10-CM | POA: Diagnosis not present

## 2018-11-20 DIAGNOSIS — M6281 Muscle weakness (generalized): Secondary | ICD-10-CM | POA: Diagnosis not present

## 2018-11-20 DIAGNOSIS — E119 Type 2 diabetes mellitus without complications: Secondary | ICD-10-CM | POA: Diagnosis not present

## 2018-11-20 DIAGNOSIS — S62231S Other displaced fracture of base of first metacarpal bone, right hand, sequela: Secondary | ICD-10-CM | POA: Diagnosis not present

## 2018-11-20 DIAGNOSIS — F0390 Unspecified dementia without behavioral disturbance: Secondary | ICD-10-CM | POA: Diagnosis not present

## 2018-11-20 DIAGNOSIS — R293 Abnormal posture: Secondary | ICD-10-CM | POA: Diagnosis not present

## 2018-11-20 DIAGNOSIS — R634 Abnormal weight loss: Secondary | ICD-10-CM | POA: Diagnosis not present

## 2018-11-20 DIAGNOSIS — E039 Hypothyroidism, unspecified: Secondary | ICD-10-CM | POA: Diagnosis not present

## 2018-11-20 DIAGNOSIS — D649 Anemia, unspecified: Secondary | ICD-10-CM | POA: Diagnosis not present

## 2018-12-07 DIAGNOSIS — H1013 Acute atopic conjunctivitis, bilateral: Secondary | ICD-10-CM | POA: Diagnosis not present

## 2018-12-08 DIAGNOSIS — R4189 Other symptoms and signs involving cognitive functions and awareness: Secondary | ICD-10-CM | POA: Diagnosis not present

## 2018-12-08 DIAGNOSIS — F331 Major depressive disorder, recurrent, moderate: Secondary | ICD-10-CM | POA: Diagnosis not present

## 2018-12-10 ENCOUNTER — Encounter (HOSPITAL_COMMUNITY): Payer: Self-pay | Admitting: *Deleted

## 2018-12-10 ENCOUNTER — Other Ambulatory Visit: Payer: Self-pay

## 2018-12-10 ENCOUNTER — Emergency Department (HOSPITAL_COMMUNITY)
Admission: EM | Admit: 2018-12-10 | Discharge: 2018-12-10 | Disposition: A | Discharge status: Home / Self Care | Payer: Medicare Other | Attending: Emergency Medicine | Admitting: Emergency Medicine

## 2018-12-10 DIAGNOSIS — I10 Essential (primary) hypertension: Secondary | ICD-10-CM | POA: Diagnosis not present

## 2018-12-10 DIAGNOSIS — F0390 Unspecified dementia without behavioral disturbance: Secondary | ICD-10-CM | POA: Insufficient documentation

## 2018-12-10 DIAGNOSIS — Z91040 Latex allergy status: Secondary | ICD-10-CM | POA: Diagnosis not present

## 2018-12-10 DIAGNOSIS — R2232 Localized swelling, mass and lump, left upper limb: Secondary | ICD-10-CM | POA: Diagnosis present

## 2018-12-10 DIAGNOSIS — L089 Local infection of the skin and subcutaneous tissue, unspecified: Secondary | ICD-10-CM | POA: Diagnosis not present

## 2018-12-10 DIAGNOSIS — Z209 Contact with and (suspected) exposure to unspecified communicable disease: Secondary | ICD-10-CM | POA: Diagnosis not present

## 2018-12-10 DIAGNOSIS — I2510 Atherosclerotic heart disease of native coronary artery without angina pectoris: Secondary | ICD-10-CM | POA: Insufficient documentation

## 2018-12-10 DIAGNOSIS — E039 Hypothyroidism, unspecified: Secondary | ICD-10-CM | POA: Insufficient documentation

## 2018-12-10 DIAGNOSIS — H109 Unspecified conjunctivitis: Secondary | ICD-10-CM | POA: Diagnosis not present

## 2018-12-10 DIAGNOSIS — L723 Sebaceous cyst: Secondary | ICD-10-CM | POA: Diagnosis not present

## 2018-12-10 DIAGNOSIS — Z7902 Long term (current) use of antithrombotics/antiplatelets: Secondary | ICD-10-CM | POA: Diagnosis not present

## 2018-12-10 DIAGNOSIS — L8995 Pressure ulcer of unspecified site, unstageable: Secondary | ICD-10-CM | POA: Diagnosis not present

## 2018-12-10 DIAGNOSIS — Z951 Presence of aortocoronary bypass graft: Secondary | ICD-10-CM | POA: Diagnosis not present

## 2018-12-10 DIAGNOSIS — Z7401 Bed confinement status: Secondary | ICD-10-CM | POA: Diagnosis not present

## 2018-12-10 DIAGNOSIS — E119 Type 2 diabetes mellitus without complications: Secondary | ICD-10-CM | POA: Insufficient documentation

## 2018-12-10 DIAGNOSIS — R404 Transient alteration of awareness: Secondary | ICD-10-CM | POA: Diagnosis not present

## 2018-12-10 DIAGNOSIS — Z8673 Personal history of transient ischemic attack (TIA), and cerebral infarction without residual deficits: Secondary | ICD-10-CM | POA: Diagnosis not present

## 2018-12-10 DIAGNOSIS — Z794 Long term (current) use of insulin: Secondary | ICD-10-CM | POA: Insufficient documentation

## 2018-12-10 DIAGNOSIS — R0902 Hypoxemia: Secondary | ICD-10-CM | POA: Diagnosis not present

## 2018-12-10 MED ORDER — LIDOCAINE-EPINEPHRINE (PF) 2 %-1:200000 IJ SOLN
INTRAMUSCULAR | Status: AC
  Filled 2018-12-10: qty 20

## 2018-12-10 MED ORDER — TOBRAMYCIN 0.3 % OP SOLN
1.0000 [drp] | Freq: Once | OPHTHALMIC | Status: AC
  Administered 2018-12-10: 11:00:00 1 [drp] via OPHTHALMIC
  Filled 2018-12-10: qty 5

## 2018-12-10 MED ORDER — DOXYCYCLINE HYCLATE 100 MG PO TABS
100.0000 mg | ORAL_TABLET | Freq: Once | ORAL | Status: AC
  Administered 2018-12-10: 100 mg via ORAL
  Filled 2018-12-10: qty 1

## 2018-12-10 MED ORDER — PENTAFLUOROPROP-TETRAFLUOROETH EX AERO
INHALATION_SPRAY | CUTANEOUS | Status: DC | PRN
  Filled 2018-12-10: qty 116

## 2018-12-10 MED ORDER — DOXYCYCLINE HYCLATE 100 MG PO CAPS: 100.0000 mg | ORAL_CAPSULE | Freq: Two times a day (BID) | ORAL | 0 refills | Status: AC

## 2018-12-10 NOTE — Discharge Instructions (Addendum)
[name] has an infected sebaceous cyst which has been drained, but can redevelop.  These sometimes require surgical intervention to resolve, especially given the size of this cyst.  Complete the entire course of the antibiotics prescribed - she will need one more tablet this evening.  She should also have a warm water compress application to the site for 15 minutes twice daily to help foster continued drainage.  She appears to have conjunctivitis as well. Please apply the tobrex antibiotic eye drops - 2 drop in each eye 4 times daily after cleaning the drainage from her eyes.  Apply the drops for 7 days.  Have her infection rechecked by her primary doctor within the next week, sooner for any worsened symptoms.

## 2018-12-10 NOTE — ED Notes (Signed)
Called [facility] for transfer back to [place].

## 2018-12-10 NOTE — ED Provider Notes (Signed)
[facility] EMERGENCY DEPARTMENT Provider Note   CSN: [id number] Arrival date & time: 12/10/18  2458    History   Chief Complaint Chief Complaint  Patient presents with  . Abscess    left scapula    HPI [name] is a 85 y.o. female presenting from a local nursing home for evaluation of an abscess on her left shoulder present for the past week.  Nursing documentation reports the site has been present x 1 week and has become slightly smaller with application of warm compresses.  There has been no drainage from the site.  There is concern for possible brown recluse bite given the swelling (no direct contact known).    Level 5 caveat given pt dementia. Unable to give hx.     The history is provided by the patient and the nursing home. The history is limited by the condition of the patient.    Past Medical History:  Diagnosis Date  . Arthritis   . Coronary atherosclerosis of native coronary artery    four-vessel CABG, 7/06, ... normal Cardiolite; EF 79%, 8/10  . Dementia ([place])   . Depression   . Dysarthria   . Dysphagia   . GERD (gastroesophageal reflux disease)   . GIB (gastrointestinal bleeding)    history of  . Hypothyroidism 06/14/2009  . Nontraumatic intracerebral hemorrhage ([place])   . Osteoarthritis   . Psychosis ([place])   . PUD (peptic ulcer disease)   . Pure hypercholesterolemia   . Shoulder pain   . Stroke [facility])    TIA  . Stromal tumor of the stomach [facility])    history of. partial gastrectomy 2001  . Type II or unspecified type diabetes mellitus without mention of complication, not stated as uncontrolled   . Unspecified essential hypertension   . Unspecified transient cerebral ischemia   . Unsteadiness on feet   . Vitamin D deficiency     Patient Active Problem List   Diagnosis Date Noted  . GERD (gastroesophageal reflux disease) 05/29/2018  . GI bleed 02/22/2018  . Anemia 02/22/2018  . Pain in the chest   . Chest pain 08/08/2014  . Status post CVA  08/08/2014  . Chronic diastolic heart failure ([place]) 09/23/2010  . S/P carotid endarterectomy 09/23/2010  . Diabetes ([place]) 03/23/2010  . SHORTNESS OF BREATH 03/23/2010  . WEAKNESS 01/28/2009  . FLATULENCE ERUCTATION AND GAS PAIN 01/28/2009  . HYPERLIPIDEMIA-MIXED 01/27/2009  . MITRAL REGURGITATION, 1-2 PLUS 01/27/2009  . Essential hypertension 01/27/2009  . CAD, ARTERY BYPASS GRAFT 01/27/2009    Past Surgical History:  Procedure Laterality Date  . ABDOMINAL HYSTERECTOMY    . CATARACT EXTRACTION    . CORONARY ARTERY BYPASS GRAFT     . four-vessel CABG, 7/06, ... normal Cardiolite; EF 79%, 8/10  . gallbladder resection    . [place]  . partial Thyroid surgery       OB History   No obstetric history on file.      Home Medications    Prior to Admission medications   Medication Sig Start Date End Date Taking? Authorizing Provider  albuterol (PROVENTIL) (2.5 MG/3ML) 0.083% nebulizer solution Take 2.5 mg by nebulization every 6 (six) hours as needed for wheezing or shortness of breath.    [provider]  amLODipine (NORVASC) 2.5 MG tablet Take 2.5 mg by mouth daily.    [provider]  Calcium 500 MG tablet Take 500 mg by mouth 2 (two) times daily.  [provider]  cholecalciferol (VITAMIN D) 1000 units tablet Take 1,000 Units by mouth daily.    [provider]  clopidogrel (PLAVIX) 75 MG tablet Take 75 mg by mouth daily.      [provider]  Cranberry 475 MG CAPS Take 475 mg by mouth daily.    [provider]  divalproex (DEPAKOTE SPRINKLE) 125 MG capsule Take by mouth 2 (two) times daily.    [provider]  doxycycline (VIBRAMYCIN) 100 MG capsule Take 1 capsule (100 mg total) by mouth 2 (two) times daily. 12/10/18   Evalee Jefferson, PA-C  DULoxetine (CYMBALTA) 30 MG capsule Take 30 mg by mouth daily.    [provider]  famotidine (PEPCID) 20 MG tablet Take 1 tablet (20  mg total) by mouth 2 (two) times daily. Patient not taking: Reported on 05/29/2018 01/19/18   Nat Christen, MD  ferrous sulfate 325 (65 FE) MG tablet Take 325 mg by mouth daily.    [provider]  insulin glargine (LANTUS) 100 UNIT/ML injection Inject 15 Units into the skin at bedtime.     [provider]  isosorbide mononitrate (IMDUR) 30 MG 24 hr tablet Take 30 mg by mouth daily.    [provider]  levothyroxine (SYNTHROID, LEVOTHROID) 75 MCG tablet Take 75 mcg by mouth daily before breakfast.    [provider]  lisinopril (PRINIVIL,ZESTRIL) 10 MG tablet Take 10 mg by mouth daily.     [provider]  loratadine (CLARITIN) 10 MG tablet Take 10 mg by mouth daily.    [provider]  metFORMIN (GLUCOPHAGE) 500 MG tablet Take 500 mg by mouth 2 (two) times daily.     [provider]  Multiple Vitamins-Minerals (CENTRUM SILVER PO) Take 1 tablet by mouth daily.     [provider]  Omega-3 Fatty Acids (FISH OIL) 1000 MG CAPS Take 1,000 mg by mouth daily.    [provider]  ondansetron (ZOFRAN) 4 MG tablet Take 1 tablet (4 mg total) by mouth every 6 (six) hours. 01/19/18   Nat Christen, MD  pantoprazole (PROTONIX) 20 MG tablet Take 20 mg by mouth 2 (two) times daily.    [provider]  polycarbophil (FIBERCON) 625 MG tablet Take 625 mg by mouth daily.    [provider]  polyethylene glycol powder (GLYCOLAX/MIRALAX) powder Take 17 g by mouth 2 (two) times daily.     [provider]  Polyvinyl Alcohol-Povidone (FRESHKOTE) 2.7-2 % SOLN Apply 1 drop to eye 3 (three) times daily.    [provider]  predniSONE (DELTASONE) 10 MG tablet Take 10 mg by mouth daily.    [provider]  Probiotic Product (PROBIOTIC DAILY PO) Take by mouth daily.    [provider]  shark liver oil-cocoa butter (PREPARATION H) 0.25-3-85.5 % suppository Place 1 suppository rectally as needed.      [provider]  vitamin C (ASCORBIC ACID) 500 MG tablet Take 500 mg by mouth daily.    [provider]    Family History Family History  Problem Relation Age of Onset  . Heart attack Brother 26  . Coronary artery disease Other   . Hypertension Other     Social History Social History   Tobacco Use  . Smoking status: Never Smoker  . Smokeless tobacco: Never Used  Substance Use Topics  . Alcohol use: No  . Drug use: No     Allergies   Codeine, Contrast media [iodinated diagnostic  agents], Sulfa antibiotics, Guaifenesin & derivatives, Iodine, Macrobid [nitrofurantoin macrocrystal], Adhesive [tape], and Latex   Review of Systems Review of Systems  Unable to perform ROS: Dementia  Skin: Positive for color change and wound.     Physical Exam Updated Vital Signs BP (!) 100/51 (BP Location: Left Arm)   Pulse 91   Temp 98.3 F (36.8 C) (Rectal)   Resp 15   Ht 5\' 7"  (1.702 m)   Wt 45.4 kg   SpO2 94%   BMI 15.66 kg/m   Physical Exam Vitals signs and nursing note reviewed.  Constitutional:      General: She is not in acute distress.    Appearance: She is well-developed.     Comments: Cachectic   HENT:     Head: Normocephalic and atraumatic.  Eyes:     General:        Right eye: Discharge present.        Left eye: Discharge present.    Comments: Yellow thick, ropy bilateral eye discharge. Conjunctiva normal right eye, mild conjunctival injection left eye.  Neck:     Musculoskeletal: Normal range of motion.  Cardiovascular:     Rate and Rhythm: Tachycardia present.     Heart sounds: Normal heart sounds.     Comments: Borderline tachy, seems triggered by contact with providers, not tachy when alone in room Pulmonary:     Effort: Pulmonary effort is normal.     Breath sounds: Normal breath sounds. No wheezing.  Skin:    General: Skin is warm and dry.     Comments: 3 cm raised erythematous lesion left upper back, 1 cm raised fluctuance portion  surrounded by 2 cm induration.  Macular erythema surrounding site.    Neurological:     Mental Status: She is alert.      ED Treatments / Results  Labs (all labs ordered are listed, but only abnormal results are displayed) Labs Reviewed - No data to display  EKG None  Radiology No results found.  Procedures Procedures (including critical care time)  INCISION AND DRAINAGE Performed by: Evalee Jefferson Consent: Verbal consent obtained. Risks and benefits: risks, benefits and alternatives were discussed Type: abscess  Body area: left upper back/ scapula  Anesthesia: Topical [place] freeze spray  Incision was made with a scalpel after needle aspiration confirming abscess.  Local anesthetic: lidocaine 2% without epinephrine  Anesthetic total: 3 ml  Complexity: complex Blunt dissection to break up loculations  Drainage: purulent followed by thick, chunky sebaceous material  Drainage amount: copious  Packing material: none  Patient tolerance: Patient tolerated the procedure well with no immediate complications.     Medications Ordered in ED Medications  pentafluoroprop-tetrafluoroeth (GEBAUERS) aerosol (has no administration in time range)  lidocaine-EPINEPHrine (XYLOCAINE W/EPI) 2 %-1:200000 (PF) injection (has no administration in time range)  tobramycin (TOBREX) 0.3 % ophthalmic solution 1 drop (1 drop Both Eyes Given 12/10/18 1040)  doxycycline (VIBRA-TABS) tablet 100 mg (100 mg Oral Given 12/10/18 1038)     Initial Impression / Assessment and Plan / ED Course  I have reviewed the triage vital signs and the nursing notes.  Pertinent labs & imaging results that were available during my care of the patient were reviewed by me and considered in my medical decision making (see chart for details).  Clinical Course as of Dec 09 1108  Sun Dec 09, 2740  4660 85 year old female with dementia here from her facility for evaluation of swelling redness and pain on her left  scapula.  She has what likely is an infected sebaceous cyst.  Plan on I&D and likely discharge.   [MB]    Clinical Course User Index [MB] Hayden Rasmussen, MD       Pt placed on doxycycline for surrounding erythema, probable early cellulitis.  Warm water soaks to facilitate continued drainage. tobrex supplied for conjunctivitis.  Plan recheck by pcp within 1 week, sooner for any worsened sx.   Final Clinical Impressions(s) / ED Diagnoses   Final diagnoses:  Infected sebaceous cyst  Conjunctivitis of both eyes, unspecified conjunctivitis type    ED Discharge Orders         Ordered    doxycycline (VIBRAMYCIN) 100 MG capsule  2 times daily     12/10/18 1010           Evalee Jefferson, Hershal Coria 12/10/18 1111    Hayden Rasmussen, MD 12/10/18 1137

## 2018-12-10 NOTE — ED Triage Notes (Signed)
Patient brought in by [facility] for left upper back "spider bite" for "3 days".

## 2018-12-21 DIAGNOSIS — L209 Atopic dermatitis, unspecified: Secondary | ICD-10-CM | POA: Diagnosis not present

## 2018-12-21 DIAGNOSIS — H1013 Acute atopic conjunctivitis, bilateral: Secondary | ICD-10-CM | POA: Diagnosis not present

## 2019-01-05 DIAGNOSIS — F331 Major depressive disorder, recurrent, moderate: Secondary | ICD-10-CM | POA: Diagnosis not present

## 2019-01-05 DIAGNOSIS — R4189 Other symptoms and signs involving cognitive functions and awareness: Secondary | ICD-10-CM | POA: Diagnosis not present

## 2019-01-17 DIAGNOSIS — I10 Essential (primary) hypertension: Secondary | ICD-10-CM | POA: Diagnosis not present

## 2019-01-17 DIAGNOSIS — F329 Major depressive disorder, single episode, unspecified: Secondary | ICD-10-CM | POA: Diagnosis not present

## 2019-01-17 DIAGNOSIS — F0391 Unspecified dementia with behavioral disturbance: Secondary | ICD-10-CM | POA: Diagnosis not present

## 2019-01-25 DIAGNOSIS — L209 Atopic dermatitis, unspecified: Secondary | ICD-10-CM | POA: Diagnosis not present

## 2019-02-01 DIAGNOSIS — L209 Atopic dermatitis, unspecified: Secondary | ICD-10-CM | POA: Diagnosis not present

## 2019-02-01 DIAGNOSIS — H1013 Acute atopic conjunctivitis, bilateral: Secondary | ICD-10-CM | POA: Diagnosis not present

## 2019-02-02 DIAGNOSIS — F331 Major depressive disorder, recurrent, moderate: Secondary | ICD-10-CM | POA: Diagnosis not present

## 2019-02-02 DIAGNOSIS — R4189 Other symptoms and signs involving cognitive functions and awareness: Secondary | ICD-10-CM | POA: Diagnosis not present

## 2019-02-08 DIAGNOSIS — L209 Atopic dermatitis, unspecified: Secondary | ICD-10-CM | POA: Diagnosis not present

## 2019-02-08 DIAGNOSIS — R634 Abnormal weight loss: Secondary | ICD-10-CM | POA: Diagnosis not present

## 2019-02-08 DIAGNOSIS — F0390 Unspecified dementia without behavioral disturbance: Secondary | ICD-10-CM | POA: Diagnosis not present

## 2019-02-22 DIAGNOSIS — F0390 Unspecified dementia without behavioral disturbance: Secondary | ICD-10-CM | POA: Diagnosis not present

## 2019-02-22 DIAGNOSIS — E119 Type 2 diabetes mellitus without complications: Secondary | ICD-10-CM | POA: Diagnosis not present

## 2019-02-22 DIAGNOSIS — I10 Essential (primary) hypertension: Secondary | ICD-10-CM | POA: Diagnosis not present

## 2019-02-22 DIAGNOSIS — E785 Hyperlipidemia, unspecified: Secondary | ICD-10-CM | POA: Diagnosis not present

## 2019-02-26 DIAGNOSIS — Z79899 Other long term (current) drug therapy: Secondary | ICD-10-CM | POA: Diagnosis not present

## 2019-02-26 DIAGNOSIS — R319 Hematuria, unspecified: Secondary | ICD-10-CM | POA: Diagnosis not present

## 2019-02-26 DIAGNOSIS — Z20828 Contact with and (suspected) exposure to other viral communicable diseases: Secondary | ICD-10-CM | POA: Diagnosis not present

## 2019-02-26 DIAGNOSIS — N390 Urinary tract infection, site not specified: Secondary | ICD-10-CM | POA: Diagnosis not present

## 2019-03-05 DIAGNOSIS — H1013 Acute atopic conjunctivitis, bilateral: Secondary | ICD-10-CM | POA: Diagnosis not present

## 2019-03-05 DIAGNOSIS — L209 Atopic dermatitis, unspecified: Secondary | ICD-10-CM | POA: Diagnosis not present

## 2019-03-05 DIAGNOSIS — Z20828 Contact with and (suspected) exposure to other viral communicable diseases: Secondary | ICD-10-CM | POA: Diagnosis not present

## 2019-03-05 DIAGNOSIS — N390 Urinary tract infection, site not specified: Secondary | ICD-10-CM | POA: Diagnosis not present

## 2019-03-12 DIAGNOSIS — Z20828 Contact with and (suspected) exposure to other viral communicable diseases: Secondary | ICD-10-CM | POA: Diagnosis not present

## 2019-03-20 DIAGNOSIS — Z20828 Contact with and (suspected) exposure to other viral communicable diseases: Secondary | ICD-10-CM | POA: Diagnosis not present

## 2019-03-23 DIAGNOSIS — Z20828 Contact with and (suspected) exposure to other viral communicable diseases: Secondary | ICD-10-CM | POA: Diagnosis not present

## 2019-03-28 DIAGNOSIS — Z20828 Contact with and (suspected) exposure to other viral communicable diseases: Secondary | ICD-10-CM | POA: Diagnosis not present

## 2019-04-01 DIAGNOSIS — Z20828 Contact with and (suspected) exposure to other viral communicable diseases: Secondary | ICD-10-CM | POA: Diagnosis not present

## 2019-04-05 DIAGNOSIS — R1312 Dysphagia, oropharyngeal phase: Secondary | ICD-10-CM | POA: Diagnosis not present

## 2019-04-05 DIAGNOSIS — F0390 Unspecified dementia without behavioral disturbance: Secondary | ICD-10-CM | POA: Diagnosis not present

## 2019-04-05 DIAGNOSIS — E119 Type 2 diabetes mellitus without complications: Secondary | ICD-10-CM | POA: Diagnosis not present

## 2019-04-06 DIAGNOSIS — R1312 Dysphagia, oropharyngeal phase: Secondary | ICD-10-CM | POA: Diagnosis not present

## 2019-04-06 DIAGNOSIS — E119 Type 2 diabetes mellitus without complications: Secondary | ICD-10-CM | POA: Diagnosis not present

## 2019-04-06 DIAGNOSIS — F0390 Unspecified dementia without behavioral disturbance: Secondary | ICD-10-CM | POA: Diagnosis not present

## 2019-04-09 DIAGNOSIS — F0390 Unspecified dementia without behavioral disturbance: Secondary | ICD-10-CM | POA: Diagnosis not present

## 2019-04-09 DIAGNOSIS — Z20828 Contact with and (suspected) exposure to other viral communicable diseases: Secondary | ICD-10-CM | POA: Diagnosis not present

## 2019-04-09 DIAGNOSIS — R1312 Dysphagia, oropharyngeal phase: Secondary | ICD-10-CM | POA: Diagnosis not present

## 2019-04-09 DIAGNOSIS — E119 Type 2 diabetes mellitus without complications: Secondary | ICD-10-CM | POA: Diagnosis not present

## 2019-04-10 DIAGNOSIS — R1312 Dysphagia, oropharyngeal phase: Secondary | ICD-10-CM | POA: Diagnosis not present

## 2019-04-10 DIAGNOSIS — F0390 Unspecified dementia without behavioral disturbance: Secondary | ICD-10-CM | POA: Diagnosis not present

## 2019-04-10 DIAGNOSIS — E119 Type 2 diabetes mellitus without complications: Secondary | ICD-10-CM | POA: Diagnosis not present

## 2019-04-11 DIAGNOSIS — F0390 Unspecified dementia without behavioral disturbance: Secondary | ICD-10-CM | POA: Diagnosis not present

## 2019-04-11 DIAGNOSIS — E119 Type 2 diabetes mellitus without complications: Secondary | ICD-10-CM | POA: Diagnosis not present

## 2019-04-11 DIAGNOSIS — R1312 Dysphagia, oropharyngeal phase: Secondary | ICD-10-CM | POA: Diagnosis not present

## 2019-04-12 DIAGNOSIS — F0390 Unspecified dementia without behavioral disturbance: Secondary | ICD-10-CM | POA: Diagnosis not present

## 2019-04-12 DIAGNOSIS — E119 Type 2 diabetes mellitus without complications: Secondary | ICD-10-CM | POA: Diagnosis not present

## 2019-04-12 DIAGNOSIS — R1312 Dysphagia, oropharyngeal phase: Secondary | ICD-10-CM | POA: Diagnosis not present

## 2019-04-14 DIAGNOSIS — Z20828 Contact with and (suspected) exposure to other viral communicable diseases: Secondary | ICD-10-CM | POA: Diagnosis not present

## 2019-04-14 DIAGNOSIS — R1312 Dysphagia, oropharyngeal phase: Secondary | ICD-10-CM | POA: Diagnosis not present

## 2019-04-14 DIAGNOSIS — E119 Type 2 diabetes mellitus without complications: Secondary | ICD-10-CM | POA: Diagnosis not present

## 2019-04-14 DIAGNOSIS — F0390 Unspecified dementia without behavioral disturbance: Secondary | ICD-10-CM | POA: Diagnosis not present

## 2019-04-15 DIAGNOSIS — R1312 Dysphagia, oropharyngeal phase: Secondary | ICD-10-CM | POA: Diagnosis not present

## 2019-04-15 DIAGNOSIS — F0390 Unspecified dementia without behavioral disturbance: Secondary | ICD-10-CM | POA: Diagnosis not present

## 2019-04-15 DIAGNOSIS — E119 Type 2 diabetes mellitus without complications: Secondary | ICD-10-CM | POA: Diagnosis not present

## 2019-04-17 DIAGNOSIS — E119 Type 2 diabetes mellitus without complications: Secondary | ICD-10-CM | POA: Diagnosis not present

## 2019-04-17 DIAGNOSIS — R1312 Dysphagia, oropharyngeal phase: Secondary | ICD-10-CM | POA: Diagnosis not present

## 2019-04-17 DIAGNOSIS — F0390 Unspecified dementia without behavioral disturbance: Secondary | ICD-10-CM | POA: Diagnosis not present

## 2019-04-18 DIAGNOSIS — E119 Type 2 diabetes mellitus without complications: Secondary | ICD-10-CM | POA: Diagnosis not present

## 2019-04-18 DIAGNOSIS — R1312 Dysphagia, oropharyngeal phase: Secondary | ICD-10-CM | POA: Diagnosis not present

## 2019-04-18 DIAGNOSIS — F0390 Unspecified dementia without behavioral disturbance: Secondary | ICD-10-CM | POA: Diagnosis not present

## 2019-04-19 DIAGNOSIS — F329 Major depressive disorder, single episode, unspecified: Secondary | ICD-10-CM | POA: Diagnosis not present

## 2019-04-19 DIAGNOSIS — F0390 Unspecified dementia without behavioral disturbance: Secondary | ICD-10-CM | POA: Diagnosis not present

## 2019-04-19 DIAGNOSIS — R1312 Dysphagia, oropharyngeal phase: Secondary | ICD-10-CM | POA: Diagnosis not present

## 2019-04-19 DIAGNOSIS — E119 Type 2 diabetes mellitus without complications: Secondary | ICD-10-CM | POA: Diagnosis not present

## 2019-04-20 DIAGNOSIS — E119 Type 2 diabetes mellitus without complications: Secondary | ICD-10-CM | POA: Diagnosis not present

## 2019-04-20 DIAGNOSIS — F0390 Unspecified dementia without behavioral disturbance: Secondary | ICD-10-CM | POA: Diagnosis not present

## 2019-04-20 DIAGNOSIS — R1312 Dysphagia, oropharyngeal phase: Secondary | ICD-10-CM | POA: Diagnosis not present

## 2019-04-23 DIAGNOSIS — F0390 Unspecified dementia without behavioral disturbance: Secondary | ICD-10-CM | POA: Diagnosis not present

## 2019-04-23 DIAGNOSIS — E119 Type 2 diabetes mellitus without complications: Secondary | ICD-10-CM | POA: Diagnosis not present

## 2019-04-23 DIAGNOSIS — R1312 Dysphagia, oropharyngeal phase: Secondary | ICD-10-CM | POA: Diagnosis not present

## 2019-04-24 DIAGNOSIS — F0390 Unspecified dementia without behavioral disturbance: Secondary | ICD-10-CM | POA: Diagnosis not present

## 2019-04-24 DIAGNOSIS — E119 Type 2 diabetes mellitus without complications: Secondary | ICD-10-CM | POA: Diagnosis not present

## 2019-04-24 DIAGNOSIS — F329 Major depressive disorder, single episode, unspecified: Secondary | ICD-10-CM | POA: Diagnosis not present

## 2019-04-24 DIAGNOSIS — N390 Urinary tract infection, site not specified: Secondary | ICD-10-CM | POA: Diagnosis not present

## 2019-04-24 DIAGNOSIS — R1312 Dysphagia, oropharyngeal phase: Secondary | ICD-10-CM | POA: Diagnosis not present

## 2019-04-24 DIAGNOSIS — I10 Essential (primary) hypertension: Secondary | ICD-10-CM | POA: Diagnosis not present

## 2019-04-25 DIAGNOSIS — E119 Type 2 diabetes mellitus without complications: Secondary | ICD-10-CM | POA: Diagnosis not present

## 2019-04-25 DIAGNOSIS — R1312 Dysphagia, oropharyngeal phase: Secondary | ICD-10-CM | POA: Diagnosis not present

## 2019-04-25 DIAGNOSIS — F0390 Unspecified dementia without behavioral disturbance: Secondary | ICD-10-CM | POA: Diagnosis not present

## 2019-04-25 DIAGNOSIS — E1151 Type 2 diabetes mellitus with diabetic peripheral angiopathy without gangrene: Secondary | ICD-10-CM | POA: Diagnosis not present

## 2019-04-25 DIAGNOSIS — L84 Corns and callosities: Secondary | ICD-10-CM | POA: Diagnosis not present

## 2019-04-26 DIAGNOSIS — R1312 Dysphagia, oropharyngeal phase: Secondary | ICD-10-CM | POA: Diagnosis not present

## 2019-04-26 DIAGNOSIS — F0390 Unspecified dementia without behavioral disturbance: Secondary | ICD-10-CM | POA: Diagnosis not present

## 2019-04-26 DIAGNOSIS — E119 Type 2 diabetes mellitus without complications: Secondary | ICD-10-CM | POA: Diagnosis not present

## 2019-04-27 DIAGNOSIS — E119 Type 2 diabetes mellitus without complications: Secondary | ICD-10-CM | POA: Diagnosis not present

## 2019-04-27 DIAGNOSIS — R1312 Dysphagia, oropharyngeal phase: Secondary | ICD-10-CM | POA: Diagnosis not present

## 2019-04-27 DIAGNOSIS — F0390 Unspecified dementia without behavioral disturbance: Secondary | ICD-10-CM | POA: Diagnosis not present

## 2019-04-30 DIAGNOSIS — E119 Type 2 diabetes mellitus without complications: Secondary | ICD-10-CM | POA: Diagnosis not present

## 2019-04-30 DIAGNOSIS — F0390 Unspecified dementia without behavioral disturbance: Secondary | ICD-10-CM | POA: Diagnosis not present

## 2019-04-30 DIAGNOSIS — R1312 Dysphagia, oropharyngeal phase: Secondary | ICD-10-CM | POA: Diagnosis not present

## 2019-05-01 DIAGNOSIS — F0390 Unspecified dementia without behavioral disturbance: Secondary | ICD-10-CM | POA: Diagnosis not present

## 2019-05-01 DIAGNOSIS — R1312 Dysphagia, oropharyngeal phase: Secondary | ICD-10-CM | POA: Diagnosis not present

## 2019-05-01 DIAGNOSIS — E119 Type 2 diabetes mellitus without complications: Secondary | ICD-10-CM | POA: Diagnosis not present

## 2019-05-02 DIAGNOSIS — E119 Type 2 diabetes mellitus without complications: Secondary | ICD-10-CM | POA: Diagnosis not present

## 2019-05-02 DIAGNOSIS — R1312 Dysphagia, oropharyngeal phase: Secondary | ICD-10-CM | POA: Diagnosis not present

## 2019-05-02 DIAGNOSIS — F0390 Unspecified dementia without behavioral disturbance: Secondary | ICD-10-CM | POA: Diagnosis not present

## 2019-05-03 DIAGNOSIS — E119 Type 2 diabetes mellitus without complications: Secondary | ICD-10-CM | POA: Diagnosis not present

## 2019-05-03 DIAGNOSIS — F0390 Unspecified dementia without behavioral disturbance: Secondary | ICD-10-CM | POA: Diagnosis not present

## 2019-05-03 DIAGNOSIS — R1312 Dysphagia, oropharyngeal phase: Secondary | ICD-10-CM | POA: Diagnosis not present

## 2019-05-04 DIAGNOSIS — R1312 Dysphagia, oropharyngeal phase: Secondary | ICD-10-CM | POA: Diagnosis not present

## 2019-05-04 DIAGNOSIS — F0390 Unspecified dementia without behavioral disturbance: Secondary | ICD-10-CM | POA: Diagnosis not present

## 2019-05-04 DIAGNOSIS — E119 Type 2 diabetes mellitus without complications: Secondary | ICD-10-CM | POA: Diagnosis not present

## 2019-05-14 DIAGNOSIS — D649 Anemia, unspecified: Secondary | ICD-10-CM | POA: Diagnosis not present

## 2019-05-14 DIAGNOSIS — D519 Vitamin B12 deficiency anemia, unspecified: Secondary | ICD-10-CM | POA: Diagnosis not present

## 2019-05-14 DIAGNOSIS — E039 Hypothyroidism, unspecified: Secondary | ICD-10-CM | POA: Diagnosis not present

## 2019-05-14 DIAGNOSIS — L309 Dermatitis, unspecified: Secondary | ICD-10-CM | POA: Diagnosis not present

## 2019-05-14 DIAGNOSIS — E119 Type 2 diabetes mellitus without complications: Secondary | ICD-10-CM | POA: Diagnosis not present

## 2019-05-14 DIAGNOSIS — E785 Hyperlipidemia, unspecified: Secondary | ICD-10-CM | POA: Diagnosis not present

## 2019-05-14 DIAGNOSIS — I10 Essential (primary) hypertension: Secondary | ICD-10-CM | POA: Diagnosis not present

## 2019-05-18 DIAGNOSIS — F331 Major depressive disorder, recurrent, moderate: Secondary | ICD-10-CM | POA: Diagnosis not present

## 2019-05-18 DIAGNOSIS — R4189 Other symptoms and signs involving cognitive functions and awareness: Secondary | ICD-10-CM | POA: Diagnosis not present

## 2019-06-18 DIAGNOSIS — Z20828 Contact with and (suspected) exposure to other viral communicable diseases: Secondary | ICD-10-CM | POA: Diagnosis not present

## 2019-06-19 DIAGNOSIS — Z23 Encounter for immunization: Secondary | ICD-10-CM | POA: Diagnosis not present

## 2019-06-21 DIAGNOSIS — I10 Essential (primary) hypertension: Secondary | ICD-10-CM | POA: Diagnosis not present

## 2019-06-21 DIAGNOSIS — D649 Anemia, unspecified: Secondary | ICD-10-CM | POA: Diagnosis not present

## 2019-06-21 DIAGNOSIS — E119 Type 2 diabetes mellitus without complications: Secondary | ICD-10-CM | POA: Diagnosis not present

## 2019-06-21 DIAGNOSIS — E785 Hyperlipidemia, unspecified: Secondary | ICD-10-CM | POA: Diagnosis not present

## 2019-06-22 DIAGNOSIS — Z20828 Contact with and (suspected) exposure to other viral communicable diseases: Secondary | ICD-10-CM | POA: Diagnosis not present

## 2019-06-26 DIAGNOSIS — Z20828 Contact with and (suspected) exposure to other viral communicable diseases: Secondary | ICD-10-CM | POA: Diagnosis not present

## 2019-06-28 DIAGNOSIS — F331 Major depressive disorder, recurrent, moderate: Secondary | ICD-10-CM | POA: Diagnosis not present

## 2019-06-28 DIAGNOSIS — R4189 Other symptoms and signs involving cognitive functions and awareness: Secondary | ICD-10-CM | POA: Diagnosis not present

## 2019-06-30 DIAGNOSIS — M19041 Primary osteoarthritis, right hand: Secondary | ICD-10-CM | POA: Diagnosis not present

## 2019-07-01 DIAGNOSIS — Z20822 Contact with and (suspected) exposure to covid-19: Secondary | ICD-10-CM | POA: Diagnosis not present

## 2019-07-03 DIAGNOSIS — M1711 Unilateral primary osteoarthritis, right knee: Secondary | ICD-10-CM | POA: Diagnosis not present

## 2019-07-03 DIAGNOSIS — K279 Peptic ulcer, site unspecified, unspecified as acute or chronic, without hemorrhage or perforation: Secondary | ICD-10-CM | POA: Diagnosis present

## 2019-07-03 DIAGNOSIS — R471 Dysarthria and anarthria: Secondary | ICD-10-CM | POA: Diagnosis present

## 2019-07-03 DIAGNOSIS — F0391 Unspecified dementia with behavioral disturbance: Secondary | ICD-10-CM | POA: Diagnosis present

## 2019-07-03 DIAGNOSIS — M79604 Pain in right leg: Secondary | ICD-10-CM | POA: Diagnosis not present

## 2019-07-03 DIAGNOSIS — F068 Other specified mental disorders due to known physiological condition: Secondary | ICD-10-CM | POA: Diagnosis present

## 2019-07-03 DIAGNOSIS — R079 Chest pain, unspecified: Secondary | ICD-10-CM | POA: Diagnosis present

## 2019-07-03 DIAGNOSIS — E119 Type 2 diabetes mellitus without complications: Secondary | ICD-10-CM | POA: Diagnosis present

## 2019-07-03 DIAGNOSIS — F339 Major depressive disorder, recurrent, unspecified: Secondary | ICD-10-CM | POA: Diagnosis present

## 2019-07-03 DIAGNOSIS — E039 Hypothyroidism, unspecified: Secondary | ICD-10-CM | POA: Diagnosis present

## 2019-07-03 DIAGNOSIS — F29 Unspecified psychosis not due to a substance or known physiological condition: Secondary | ICD-10-CM | POA: Diagnosis present

## 2019-07-03 DIAGNOSIS — M1990 Unspecified osteoarthritis, unspecified site: Secondary | ICD-10-CM | POA: Diagnosis present

## 2019-07-03 DIAGNOSIS — E559 Vitamin D deficiency, unspecified: Secondary | ICD-10-CM | POA: Diagnosis present

## 2019-07-03 DIAGNOSIS — M79661 Pain in right lower leg: Secondary | ICD-10-CM | POA: Diagnosis not present

## 2019-07-03 DIAGNOSIS — I10 Essential (primary) hypertension: Secondary | ICD-10-CM | POA: Diagnosis present

## 2019-07-03 DIAGNOSIS — M6281 Muscle weakness (generalized): Secondary | ICD-10-CM | POA: Diagnosis present

## 2019-07-03 DIAGNOSIS — F0390 Unspecified dementia without behavioral disturbance: Secondary | ICD-10-CM | POA: Diagnosis present

## 2019-07-03 DIAGNOSIS — K219 Gastro-esophageal reflux disease without esophagitis: Secondary | ICD-10-CM | POA: Diagnosis present

## 2019-07-03 DIAGNOSIS — R1312 Dysphagia, oropharyngeal phase: Secondary | ICD-10-CM | POA: Diagnosis present

## 2019-07-03 DIAGNOSIS — F329 Major depressive disorder, single episode, unspecified: Secondary | ICD-10-CM | POA: Diagnosis present

## 2019-07-03 DIAGNOSIS — R2681 Unsteadiness on feet: Secondary | ICD-10-CM | POA: Diagnosis present

## 2019-07-03 DIAGNOSIS — L309 Dermatitis, unspecified: Secondary | ICD-10-CM | POA: Diagnosis present

## 2019-07-03 DIAGNOSIS — M25511 Pain in right shoulder: Secondary | ICD-10-CM | POA: Diagnosis present

## 2019-07-03 DIAGNOSIS — I2510 Atherosclerotic heart disease of native coronary artery without angina pectoris: Secondary | ICD-10-CM | POA: Diagnosis present

## 2019-07-03 DIAGNOSIS — E785 Hyperlipidemia, unspecified: Secondary | ICD-10-CM | POA: Diagnosis present

## 2019-07-03 DIAGNOSIS — H04129 Dry eye syndrome of unspecified lacrimal gland: Secondary | ICD-10-CM | POA: Diagnosis present

## 2019-07-03 DIAGNOSIS — Z8673 Personal history of transient ischemic attack (TIA), and cerebral infarction without residual deficits: Secondary | ICD-10-CM | POA: Diagnosis not present

## 2019-07-03 DIAGNOSIS — R41841 Cognitive communication deficit: Secondary | ICD-10-CM | POA: Diagnosis present

## 2019-07-03 DIAGNOSIS — U071 COVID-19: Secondary | ICD-10-CM | POA: Diagnosis not present

## 2019-07-09 DIAGNOSIS — M79604 Pain in right leg: Secondary | ICD-10-CM | POA: Diagnosis not present

## 2019-07-09 DIAGNOSIS — U071 COVID-19: Secondary | ICD-10-CM | POA: Diagnosis not present

## 2019-07-10 DIAGNOSIS — M79661 Pain in right lower leg: Secondary | ICD-10-CM | POA: Diagnosis not present

## 2019-07-10 DIAGNOSIS — M1711 Unilateral primary osteoarthritis, right knee: Secondary | ICD-10-CM | POA: Diagnosis not present

## 2019-07-11 DIAGNOSIS — M79604 Pain in right leg: Secondary | ICD-10-CM | POA: Diagnosis not present

## 2019-07-12 DIAGNOSIS — M79604 Pain in right leg: Secondary | ICD-10-CM | POA: Diagnosis not present

## 2019-07-16 DIAGNOSIS — N184 Chronic kidney disease, stage 4 (severe): Secondary | ICD-10-CM | POA: Diagnosis not present

## 2019-07-16 DIAGNOSIS — D649 Anemia, unspecified: Secondary | ICD-10-CM | POA: Diagnosis not present

## 2019-07-17 DIAGNOSIS — Z23 Encounter for immunization: Secondary | ICD-10-CM | POA: Diagnosis not present

## 2019-07-19 DIAGNOSIS — E875 Hyperkalemia: Secondary | ICD-10-CM | POA: Diagnosis not present

## 2019-07-19 DIAGNOSIS — D649 Anemia, unspecified: Secondary | ICD-10-CM | POA: Diagnosis not present

## 2019-07-19 DIAGNOSIS — R7989 Other specified abnormal findings of blood chemistry: Secondary | ICD-10-CM | POA: Diagnosis not present

## 2019-07-26 DIAGNOSIS — D649 Anemia, unspecified: Secondary | ICD-10-CM | POA: Diagnosis not present

## 2019-07-26 DIAGNOSIS — R7989 Other specified abnormal findings of blood chemistry: Secondary | ICD-10-CM | POA: Diagnosis not present

## 2019-07-30 DIAGNOSIS — R799 Abnormal finding of blood chemistry, unspecified: Secondary | ICD-10-CM | POA: Diagnosis not present

## 2019-07-30 DIAGNOSIS — H00016 Hordeolum externum left eye, unspecified eyelid: Secondary | ICD-10-CM | POA: Diagnosis not present

## 2019-07-30 DIAGNOSIS — E875 Hyperkalemia: Secondary | ICD-10-CM | POA: Diagnosis not present

## 2019-08-03 DIAGNOSIS — R7989 Other specified abnormal findings of blood chemistry: Secondary | ICD-10-CM | POA: Diagnosis not present

## 2019-08-03 DIAGNOSIS — D649 Anemia, unspecified: Secondary | ICD-10-CM | POA: Diagnosis not present

## 2019-08-06 DIAGNOSIS — R799 Abnormal finding of blood chemistry, unspecified: Secondary | ICD-10-CM | POA: Diagnosis not present

## 2019-08-06 DIAGNOSIS — H00016 Hordeolum externum left eye, unspecified eyelid: Secondary | ICD-10-CM | POA: Diagnosis not present

## 2019-08-06 DIAGNOSIS — E875 Hyperkalemia: Secondary | ICD-10-CM | POA: Diagnosis not present

## 2019-08-14 DIAGNOSIS — I10 Essential (primary) hypertension: Secondary | ICD-10-CM | POA: Diagnosis not present

## 2019-08-14 DIAGNOSIS — F329 Major depressive disorder, single episode, unspecified: Secondary | ICD-10-CM | POA: Diagnosis not present

## 2019-08-14 DIAGNOSIS — F0390 Unspecified dementia without behavioral disturbance: Secondary | ICD-10-CM | POA: Diagnosis not present

## 2019-08-16 DIAGNOSIS — R7989 Other specified abnormal findings of blood chemistry: Secondary | ICD-10-CM | POA: Diagnosis not present

## 2019-08-16 DIAGNOSIS — D649 Anemia, unspecified: Secondary | ICD-10-CM | POA: Diagnosis not present

## 2019-08-16 DIAGNOSIS — F0390 Unspecified dementia without behavioral disturbance: Secondary | ICD-10-CM | POA: Diagnosis not present

## 2019-08-16 DIAGNOSIS — R0689 Other abnormalities of breathing: Secondary | ICD-10-CM | POA: Diagnosis not present

## 2019-08-16 DIAGNOSIS — R05 Cough: Secondary | ICD-10-CM | POA: Diagnosis not present

## 2019-08-23 DIAGNOSIS — R4189 Other symptoms and signs involving cognitive functions and awareness: Secondary | ICD-10-CM | POA: Diagnosis not present

## 2019-08-23 DIAGNOSIS — F331 Major depressive disorder, recurrent, moderate: Secondary | ICD-10-CM | POA: Diagnosis not present

## 2019-09-10 DIAGNOSIS — F0390 Unspecified dementia without behavioral disturbance: Secondary | ICD-10-CM | POA: Diagnosis not present

## 2019-09-10 DIAGNOSIS — M1990 Unspecified osteoarthritis, unspecified site: Secondary | ICD-10-CM | POA: Diagnosis not present

## 2019-09-10 DIAGNOSIS — M6281 Muscle weakness (generalized): Secondary | ICD-10-CM | POA: Diagnosis not present

## 2019-09-11 DIAGNOSIS — M1990 Unspecified osteoarthritis, unspecified site: Secondary | ICD-10-CM | POA: Diagnosis not present

## 2019-09-11 DIAGNOSIS — M6281 Muscle weakness (generalized): Secondary | ICD-10-CM | POA: Diagnosis not present

## 2019-09-11 DIAGNOSIS — F0390 Unspecified dementia without behavioral disturbance: Secondary | ICD-10-CM | POA: Diagnosis not present

## 2019-09-12 DIAGNOSIS — M1990 Unspecified osteoarthritis, unspecified site: Secondary | ICD-10-CM | POA: Diagnosis not present

## 2019-09-12 DIAGNOSIS — M6281 Muscle weakness (generalized): Secondary | ICD-10-CM | POA: Diagnosis not present

## 2019-09-12 DIAGNOSIS — F0390 Unspecified dementia without behavioral disturbance: Secondary | ICD-10-CM | POA: Diagnosis not present

## 2019-09-13 DIAGNOSIS — F0390 Unspecified dementia without behavioral disturbance: Secondary | ICD-10-CM | POA: Diagnosis not present

## 2019-09-13 DIAGNOSIS — M1990 Unspecified osteoarthritis, unspecified site: Secondary | ICD-10-CM | POA: Diagnosis not present

## 2019-09-13 DIAGNOSIS — M6281 Muscle weakness (generalized): Secondary | ICD-10-CM | POA: Diagnosis not present

## 2019-09-14 DIAGNOSIS — F0390 Unspecified dementia without behavioral disturbance: Secondary | ICD-10-CM | POA: Diagnosis not present

## 2019-09-14 DIAGNOSIS — M1990 Unspecified osteoarthritis, unspecified site: Secondary | ICD-10-CM | POA: Diagnosis not present

## 2019-09-14 DIAGNOSIS — M6281 Muscle weakness (generalized): Secondary | ICD-10-CM | POA: Diagnosis not present

## 2019-09-17 DIAGNOSIS — M1990 Unspecified osteoarthritis, unspecified site: Secondary | ICD-10-CM | POA: Diagnosis not present

## 2019-09-17 DIAGNOSIS — M6281 Muscle weakness (generalized): Secondary | ICD-10-CM | POA: Diagnosis not present

## 2019-09-17 DIAGNOSIS — F0390 Unspecified dementia without behavioral disturbance: Secondary | ICD-10-CM | POA: Diagnosis not present

## 2019-09-18 DIAGNOSIS — M1990 Unspecified osteoarthritis, unspecified site: Secondary | ICD-10-CM | POA: Diagnosis not present

## 2019-09-18 DIAGNOSIS — F0390 Unspecified dementia without behavioral disturbance: Secondary | ICD-10-CM | POA: Diagnosis not present

## 2019-09-18 DIAGNOSIS — M6281 Muscle weakness (generalized): Secondary | ICD-10-CM | POA: Diagnosis not present

## 2019-09-19 DIAGNOSIS — M1990 Unspecified osteoarthritis, unspecified site: Secondary | ICD-10-CM | POA: Diagnosis not present

## 2019-09-19 DIAGNOSIS — F0390 Unspecified dementia without behavioral disturbance: Secondary | ICD-10-CM | POA: Diagnosis not present

## 2019-09-19 DIAGNOSIS — M6281 Muscle weakness (generalized): Secondary | ICD-10-CM | POA: Diagnosis not present

## 2019-09-20 DIAGNOSIS — F0390 Unspecified dementia without behavioral disturbance: Secondary | ICD-10-CM | POA: Diagnosis not present

## 2019-09-20 DIAGNOSIS — M6281 Muscle weakness (generalized): Secondary | ICD-10-CM | POA: Diagnosis not present

## 2019-09-20 DIAGNOSIS — M1990 Unspecified osteoarthritis, unspecified site: Secondary | ICD-10-CM | POA: Diagnosis not present

## 2019-09-24 DIAGNOSIS — M6281 Muscle weakness (generalized): Secondary | ICD-10-CM | POA: Diagnosis not present

## 2019-09-24 DIAGNOSIS — F0390 Unspecified dementia without behavioral disturbance: Secondary | ICD-10-CM | POA: Diagnosis not present

## 2019-09-24 DIAGNOSIS — M1990 Unspecified osteoarthritis, unspecified site: Secondary | ICD-10-CM | POA: Diagnosis not present

## 2019-09-25 DIAGNOSIS — M6281 Muscle weakness (generalized): Secondary | ICD-10-CM | POA: Diagnosis not present

## 2019-09-25 DIAGNOSIS — F0390 Unspecified dementia without behavioral disturbance: Secondary | ICD-10-CM | POA: Diagnosis not present

## 2019-09-25 DIAGNOSIS — M1990 Unspecified osteoarthritis, unspecified site: Secondary | ICD-10-CM | POA: Diagnosis not present

## 2019-09-26 DIAGNOSIS — M6281 Muscle weakness (generalized): Secondary | ICD-10-CM | POA: Diagnosis not present

## 2019-09-26 DIAGNOSIS — M1990 Unspecified osteoarthritis, unspecified site: Secondary | ICD-10-CM | POA: Diagnosis not present

## 2019-09-26 DIAGNOSIS — F0390 Unspecified dementia without behavioral disturbance: Secondary | ICD-10-CM | POA: Diagnosis not present

## 2019-09-27 DIAGNOSIS — F0390 Unspecified dementia without behavioral disturbance: Secondary | ICD-10-CM | POA: Diagnosis not present

## 2019-09-27 DIAGNOSIS — M1990 Unspecified osteoarthritis, unspecified site: Secondary | ICD-10-CM | POA: Diagnosis not present

## 2019-09-27 DIAGNOSIS — M6281 Muscle weakness (generalized): Secondary | ICD-10-CM | POA: Diagnosis not present

## 2019-09-28 DIAGNOSIS — M1990 Unspecified osteoarthritis, unspecified site: Secondary | ICD-10-CM | POA: Diagnosis not present

## 2019-09-28 DIAGNOSIS — F0390 Unspecified dementia without behavioral disturbance: Secondary | ICD-10-CM | POA: Diagnosis not present

## 2019-09-28 DIAGNOSIS — M6281 Muscle weakness (generalized): Secondary | ICD-10-CM | POA: Diagnosis not present

## 2019-10-01 DIAGNOSIS — M1990 Unspecified osteoarthritis, unspecified site: Secondary | ICD-10-CM | POA: Diagnosis not present

## 2019-10-01 DIAGNOSIS — F0390 Unspecified dementia without behavioral disturbance: Secondary | ICD-10-CM | POA: Diagnosis not present

## 2019-10-01 DIAGNOSIS — M6281 Muscle weakness (generalized): Secondary | ICD-10-CM | POA: Diagnosis not present

## 2019-10-02 DIAGNOSIS — M1990 Unspecified osteoarthritis, unspecified site: Secondary | ICD-10-CM | POA: Diagnosis not present

## 2019-10-02 DIAGNOSIS — M6281 Muscle weakness (generalized): Secondary | ICD-10-CM | POA: Diagnosis not present

## 2019-10-02 DIAGNOSIS — F0390 Unspecified dementia without behavioral disturbance: Secondary | ICD-10-CM | POA: Diagnosis not present

## 2019-10-03 DIAGNOSIS — M6281 Muscle weakness (generalized): Secondary | ICD-10-CM | POA: Diagnosis not present

## 2019-10-03 DIAGNOSIS — M1990 Unspecified osteoarthritis, unspecified site: Secondary | ICD-10-CM | POA: Diagnosis not present

## 2019-10-03 DIAGNOSIS — F0390 Unspecified dementia without behavioral disturbance: Secondary | ICD-10-CM | POA: Diagnosis not present

## 2019-10-04 DIAGNOSIS — M1990 Unspecified osteoarthritis, unspecified site: Secondary | ICD-10-CM | POA: Diagnosis not present

## 2019-10-04 DIAGNOSIS — F0390 Unspecified dementia without behavioral disturbance: Secondary | ICD-10-CM | POA: Diagnosis not present

## 2019-10-04 DIAGNOSIS — M6281 Muscle weakness (generalized): Secondary | ICD-10-CM | POA: Diagnosis not present

## 2019-10-05 DIAGNOSIS — F0390 Unspecified dementia without behavioral disturbance: Secondary | ICD-10-CM | POA: Diagnosis not present

## 2019-10-05 DIAGNOSIS — M6281 Muscle weakness (generalized): Secondary | ICD-10-CM | POA: Diagnosis not present

## 2019-10-05 DIAGNOSIS — M1990 Unspecified osteoarthritis, unspecified site: Secondary | ICD-10-CM | POA: Diagnosis not present

## 2019-10-08 DIAGNOSIS — M6281 Muscle weakness (generalized): Secondary | ICD-10-CM | POA: Diagnosis not present

## 2019-10-08 DIAGNOSIS — F0390 Unspecified dementia without behavioral disturbance: Secondary | ICD-10-CM | POA: Diagnosis not present

## 2019-10-08 DIAGNOSIS — M1990 Unspecified osteoarthritis, unspecified site: Secondary | ICD-10-CM | POA: Diagnosis not present

## 2019-10-09 DIAGNOSIS — M1990 Unspecified osteoarthritis, unspecified site: Secondary | ICD-10-CM | POA: Diagnosis not present

## 2019-10-09 DIAGNOSIS — M6281 Muscle weakness (generalized): Secondary | ICD-10-CM | POA: Diagnosis not present

## 2019-10-09 DIAGNOSIS — F0390 Unspecified dementia without behavioral disturbance: Secondary | ICD-10-CM | POA: Diagnosis not present

## 2019-10-10 DIAGNOSIS — M6281 Muscle weakness (generalized): Secondary | ICD-10-CM | POA: Diagnosis not present

## 2019-10-10 DIAGNOSIS — M1990 Unspecified osteoarthritis, unspecified site: Secondary | ICD-10-CM | POA: Diagnosis not present

## 2019-10-10 DIAGNOSIS — F0390 Unspecified dementia without behavioral disturbance: Secondary | ICD-10-CM | POA: Diagnosis not present

## 2019-10-11 DIAGNOSIS — M6281 Muscle weakness (generalized): Secondary | ICD-10-CM | POA: Diagnosis not present

## 2019-10-11 DIAGNOSIS — F0390 Unspecified dementia without behavioral disturbance: Secondary | ICD-10-CM | POA: Diagnosis not present

## 2019-10-11 DIAGNOSIS — M1990 Unspecified osteoarthritis, unspecified site: Secondary | ICD-10-CM | POA: Diagnosis not present

## 2019-10-12 DIAGNOSIS — M6281 Muscle weakness (generalized): Secondary | ICD-10-CM | POA: Diagnosis not present

## 2019-10-12 DIAGNOSIS — M1990 Unspecified osteoarthritis, unspecified site: Secondary | ICD-10-CM | POA: Diagnosis not present

## 2019-10-12 DIAGNOSIS — I10 Essential (primary) hypertension: Secondary | ICD-10-CM | POA: Diagnosis not present

## 2019-10-12 DIAGNOSIS — F0390 Unspecified dementia without behavioral disturbance: Secondary | ICD-10-CM | POA: Diagnosis not present

## 2019-10-12 DIAGNOSIS — I2510 Atherosclerotic heart disease of native coronary artery without angina pectoris: Secondary | ICD-10-CM | POA: Diagnosis not present

## 2019-10-12 DIAGNOSIS — E785 Hyperlipidemia, unspecified: Secondary | ICD-10-CM | POA: Diagnosis not present

## 2019-10-15 DIAGNOSIS — M1990 Unspecified osteoarthritis, unspecified site: Secondary | ICD-10-CM | POA: Diagnosis not present

## 2019-10-15 DIAGNOSIS — F0390 Unspecified dementia without behavioral disturbance: Secondary | ICD-10-CM | POA: Diagnosis not present

## 2019-10-15 DIAGNOSIS — M6281 Muscle weakness (generalized): Secondary | ICD-10-CM | POA: Diagnosis not present

## 2019-10-16 DIAGNOSIS — M1990 Unspecified osteoarthritis, unspecified site: Secondary | ICD-10-CM | POA: Diagnosis not present

## 2019-10-16 DIAGNOSIS — F0390 Unspecified dementia without behavioral disturbance: Secondary | ICD-10-CM | POA: Diagnosis not present

## 2019-10-16 DIAGNOSIS — M6281 Muscle weakness (generalized): Secondary | ICD-10-CM | POA: Diagnosis not present

## 2019-10-17 DIAGNOSIS — E1151 Type 2 diabetes mellitus with diabetic peripheral angiopathy without gangrene: Secondary | ICD-10-CM | POA: Diagnosis not present

## 2019-10-17 DIAGNOSIS — F0390 Unspecified dementia without behavioral disturbance: Secondary | ICD-10-CM | POA: Diagnosis not present

## 2019-10-17 DIAGNOSIS — L84 Corns and callosities: Secondary | ICD-10-CM | POA: Diagnosis not present

## 2019-10-17 DIAGNOSIS — M1990 Unspecified osteoarthritis, unspecified site: Secondary | ICD-10-CM | POA: Diagnosis not present

## 2019-10-17 DIAGNOSIS — M6281 Muscle weakness (generalized): Secondary | ICD-10-CM | POA: Diagnosis not present

## 2019-10-18 DIAGNOSIS — M1990 Unspecified osteoarthritis, unspecified site: Secondary | ICD-10-CM | POA: Diagnosis not present

## 2019-10-18 DIAGNOSIS — F0390 Unspecified dementia without behavioral disturbance: Secondary | ICD-10-CM | POA: Diagnosis not present

## 2019-10-18 DIAGNOSIS — M6281 Muscle weakness (generalized): Secondary | ICD-10-CM | POA: Diagnosis not present

## 2019-10-19 DIAGNOSIS — M1990 Unspecified osteoarthritis, unspecified site: Secondary | ICD-10-CM | POA: Diagnosis not present

## 2019-10-19 DIAGNOSIS — F0390 Unspecified dementia without behavioral disturbance: Secondary | ICD-10-CM | POA: Diagnosis not present

## 2019-10-19 DIAGNOSIS — M6281 Muscle weakness (generalized): Secondary | ICD-10-CM | POA: Diagnosis not present

## 2019-10-22 DIAGNOSIS — M6281 Muscle weakness (generalized): Secondary | ICD-10-CM | POA: Diagnosis not present

## 2019-10-22 DIAGNOSIS — H109 Unspecified conjunctivitis: Secondary | ICD-10-CM | POA: Diagnosis not present

## 2019-10-22 DIAGNOSIS — F0390 Unspecified dementia without behavioral disturbance: Secondary | ICD-10-CM | POA: Diagnosis not present

## 2019-10-22 DIAGNOSIS — L309 Dermatitis, unspecified: Secondary | ICD-10-CM | POA: Diagnosis not present

## 2019-10-22 DIAGNOSIS — M1990 Unspecified osteoarthritis, unspecified site: Secondary | ICD-10-CM | POA: Diagnosis not present

## 2019-10-23 DIAGNOSIS — F0390 Unspecified dementia without behavioral disturbance: Secondary | ICD-10-CM | POA: Diagnosis not present

## 2019-10-23 DIAGNOSIS — M1990 Unspecified osteoarthritis, unspecified site: Secondary | ICD-10-CM | POA: Diagnosis not present

## 2019-10-23 DIAGNOSIS — M6281 Muscle weakness (generalized): Secondary | ICD-10-CM | POA: Diagnosis not present

## 2019-10-24 DIAGNOSIS — M6281 Muscle weakness (generalized): Secondary | ICD-10-CM | POA: Diagnosis not present

## 2019-10-24 DIAGNOSIS — M1990 Unspecified osteoarthritis, unspecified site: Secondary | ICD-10-CM | POA: Diagnosis not present

## 2019-10-24 DIAGNOSIS — F0390 Unspecified dementia without behavioral disturbance: Secondary | ICD-10-CM | POA: Diagnosis not present

## 2019-10-25 DIAGNOSIS — R4189 Other symptoms and signs involving cognitive functions and awareness: Secondary | ICD-10-CM | POA: Diagnosis not present

## 2019-10-25 DIAGNOSIS — F331 Major depressive disorder, recurrent, moderate: Secondary | ICD-10-CM | POA: Diagnosis not present

## 2019-10-25 DIAGNOSIS — F0390 Unspecified dementia without behavioral disturbance: Secondary | ICD-10-CM | POA: Diagnosis not present

## 2019-10-25 DIAGNOSIS — M6281 Muscle weakness (generalized): Secondary | ICD-10-CM | POA: Diagnosis not present

## 2019-10-25 DIAGNOSIS — M1990 Unspecified osteoarthritis, unspecified site: Secondary | ICD-10-CM | POA: Diagnosis not present

## 2019-10-26 DIAGNOSIS — I2510 Atherosclerotic heart disease of native coronary artery without angina pectoris: Secondary | ICD-10-CM | POA: Diagnosis not present

## 2019-10-26 DIAGNOSIS — Z79899 Other long term (current) drug therapy: Secondary | ICD-10-CM | POA: Diagnosis not present

## 2019-10-26 DIAGNOSIS — F0390 Unspecified dementia without behavioral disturbance: Secondary | ICD-10-CM | POA: Diagnosis not present

## 2019-10-26 DIAGNOSIS — M6281 Muscle weakness (generalized): Secondary | ICD-10-CM | POA: Diagnosis not present

## 2019-10-26 DIAGNOSIS — M1990 Unspecified osteoarthritis, unspecified site: Secondary | ICD-10-CM | POA: Diagnosis not present

## 2019-10-29 DIAGNOSIS — M6281 Muscle weakness (generalized): Secondary | ICD-10-CM | POA: Diagnosis not present

## 2019-10-29 DIAGNOSIS — M1990 Unspecified osteoarthritis, unspecified site: Secondary | ICD-10-CM | POA: Diagnosis not present

## 2019-10-29 DIAGNOSIS — F0390 Unspecified dementia without behavioral disturbance: Secondary | ICD-10-CM | POA: Diagnosis not present

## 2019-10-30 DIAGNOSIS — F0390 Unspecified dementia without behavioral disturbance: Secondary | ICD-10-CM | POA: Diagnosis not present

## 2019-10-30 DIAGNOSIS — M1990 Unspecified osteoarthritis, unspecified site: Secondary | ICD-10-CM | POA: Diagnosis not present

## 2019-10-30 DIAGNOSIS — M6281 Muscle weakness (generalized): Secondary | ICD-10-CM | POA: Diagnosis not present

## 2019-10-31 DIAGNOSIS — F0390 Unspecified dementia without behavioral disturbance: Secondary | ICD-10-CM | POA: Diagnosis not present

## 2019-10-31 DIAGNOSIS — M1990 Unspecified osteoarthritis, unspecified site: Secondary | ICD-10-CM | POA: Diagnosis not present

## 2019-10-31 DIAGNOSIS — M6281 Muscle weakness (generalized): Secondary | ICD-10-CM | POA: Diagnosis not present

## 2019-11-01 DIAGNOSIS — M1990 Unspecified osteoarthritis, unspecified site: Secondary | ICD-10-CM | POA: Diagnosis not present

## 2019-11-01 DIAGNOSIS — F0390 Unspecified dementia without behavioral disturbance: Secondary | ICD-10-CM | POA: Diagnosis not present

## 2019-11-01 DIAGNOSIS — M6281 Muscle weakness (generalized): Secondary | ICD-10-CM | POA: Diagnosis not present

## 2019-11-02 DIAGNOSIS — M1990 Unspecified osteoarthritis, unspecified site: Secondary | ICD-10-CM | POA: Diagnosis not present

## 2019-11-02 DIAGNOSIS — F0390 Unspecified dementia without behavioral disturbance: Secondary | ICD-10-CM | POA: Diagnosis not present

## 2019-11-02 DIAGNOSIS — M6281 Muscle weakness (generalized): Secondary | ICD-10-CM | POA: Diagnosis not present

## 2019-11-05 DIAGNOSIS — F0390 Unspecified dementia without behavioral disturbance: Secondary | ICD-10-CM | POA: Diagnosis not present

## 2019-11-05 DIAGNOSIS — M6281 Muscle weakness (generalized): Secondary | ICD-10-CM | POA: Diagnosis not present

## 2019-11-05 DIAGNOSIS — M1990 Unspecified osteoarthritis, unspecified site: Secondary | ICD-10-CM | POA: Diagnosis not present

## 2019-11-06 DIAGNOSIS — M6281 Muscle weakness (generalized): Secondary | ICD-10-CM | POA: Diagnosis not present

## 2019-11-06 DIAGNOSIS — M1990 Unspecified osteoarthritis, unspecified site: Secondary | ICD-10-CM | POA: Diagnosis not present

## 2019-11-06 DIAGNOSIS — F0390 Unspecified dementia without behavioral disturbance: Secondary | ICD-10-CM | POA: Diagnosis not present

## 2019-11-07 DIAGNOSIS — F0390 Unspecified dementia without behavioral disturbance: Secondary | ICD-10-CM | POA: Diagnosis not present

## 2019-11-07 DIAGNOSIS — M1990 Unspecified osteoarthritis, unspecified site: Secondary | ICD-10-CM | POA: Diagnosis not present

## 2019-11-07 DIAGNOSIS — M6281 Muscle weakness (generalized): Secondary | ICD-10-CM | POA: Diagnosis not present

## 2019-11-08 DIAGNOSIS — M6281 Muscle weakness (generalized): Secondary | ICD-10-CM | POA: Diagnosis not present

## 2019-11-08 DIAGNOSIS — F0390 Unspecified dementia without behavioral disturbance: Secondary | ICD-10-CM | POA: Diagnosis not present

## 2019-11-08 DIAGNOSIS — M1990 Unspecified osteoarthritis, unspecified site: Secondary | ICD-10-CM | POA: Diagnosis not present

## 2019-12-26 DIAGNOSIS — L84 Corns and callosities: Secondary | ICD-10-CM | POA: Diagnosis not present

## 2019-12-26 DIAGNOSIS — E1151 Type 2 diabetes mellitus with diabetic peripheral angiopathy without gangrene: Secondary | ICD-10-CM | POA: Diagnosis not present

## 2020-01-25 DIAGNOSIS — F0391 Unspecified dementia with behavioral disturbance: Secondary | ICD-10-CM | POA: Diagnosis not present

## 2020-01-25 DIAGNOSIS — F331 Major depressive disorder, recurrent, moderate: Secondary | ICD-10-CM | POA: Diagnosis not present

## 2020-01-25 DIAGNOSIS — G4723 Circadian rhythm sleep disorder, irregular sleep wake type: Secondary | ICD-10-CM | POA: Diagnosis not present

## 2020-02-11 DIAGNOSIS — E039 Hypothyroidism, unspecified: Secondary | ICD-10-CM | POA: Diagnosis not present

## 2020-02-11 DIAGNOSIS — E119 Type 2 diabetes mellitus without complications: Secondary | ICD-10-CM | POA: Diagnosis not present

## 2020-02-11 DIAGNOSIS — F329 Major depressive disorder, single episode, unspecified: Secondary | ICD-10-CM | POA: Diagnosis not present

## 2020-02-11 DIAGNOSIS — E785 Hyperlipidemia, unspecified: Secondary | ICD-10-CM | POA: Diagnosis not present

## 2020-02-11 DIAGNOSIS — I2510 Atherosclerotic heart disease of native coronary artery without angina pectoris: Secondary | ICD-10-CM | POA: Diagnosis not present

## 2020-02-11 DIAGNOSIS — I10 Essential (primary) hypertension: Secondary | ICD-10-CM | POA: Diagnosis not present

## 2020-02-15 DIAGNOSIS — F331 Major depressive disorder, recurrent, moderate: Secondary | ICD-10-CM | POA: Diagnosis not present

## 2020-02-15 DIAGNOSIS — G4723 Circadian rhythm sleep disorder, irregular sleep wake type: Secondary | ICD-10-CM | POA: Diagnosis not present

## 2020-02-15 DIAGNOSIS — F0391 Unspecified dementia with behavioral disturbance: Secondary | ICD-10-CM | POA: Diagnosis not present

## 2020-02-21 DIAGNOSIS — M1990 Unspecified osteoarthritis, unspecified site: Secondary | ICD-10-CM | POA: Diagnosis not present

## 2020-02-21 DIAGNOSIS — M6281 Muscle weakness (generalized): Secondary | ICD-10-CM | POA: Diagnosis not present

## 2020-02-22 DIAGNOSIS — M6281 Muscle weakness (generalized): Secondary | ICD-10-CM | POA: Diagnosis not present

## 2020-02-22 DIAGNOSIS — M1990 Unspecified osteoarthritis, unspecified site: Secondary | ICD-10-CM | POA: Diagnosis not present

## 2020-02-25 DIAGNOSIS — M6281 Muscle weakness (generalized): Secondary | ICD-10-CM | POA: Diagnosis not present

## 2020-02-25 DIAGNOSIS — M1990 Unspecified osteoarthritis, unspecified site: Secondary | ICD-10-CM | POA: Diagnosis not present

## 2020-02-26 DIAGNOSIS — M6281 Muscle weakness (generalized): Secondary | ICD-10-CM | POA: Diagnosis not present

## 2020-02-26 DIAGNOSIS — M1990 Unspecified osteoarthritis, unspecified site: Secondary | ICD-10-CM | POA: Diagnosis not present

## 2020-02-27 DIAGNOSIS — M1990 Unspecified osteoarthritis, unspecified site: Secondary | ICD-10-CM | POA: Diagnosis not present

## 2020-02-27 DIAGNOSIS — M6281 Muscle weakness (generalized): Secondary | ICD-10-CM | POA: Diagnosis not present

## 2020-02-28 DIAGNOSIS — M6281 Muscle weakness (generalized): Secondary | ICD-10-CM | POA: Diagnosis not present

## 2020-02-28 DIAGNOSIS — M1990 Unspecified osteoarthritis, unspecified site: Secondary | ICD-10-CM | POA: Diagnosis not present

## 2020-02-29 DIAGNOSIS — M6281 Muscle weakness (generalized): Secondary | ICD-10-CM | POA: Diagnosis not present

## 2020-02-29 DIAGNOSIS — M1990 Unspecified osteoarthritis, unspecified site: Secondary | ICD-10-CM | POA: Diagnosis not present

## 2020-03-03 DIAGNOSIS — M6281 Muscle weakness (generalized): Secondary | ICD-10-CM | POA: Diagnosis not present

## 2020-03-03 DIAGNOSIS — M1990 Unspecified osteoarthritis, unspecified site: Secondary | ICD-10-CM | POA: Diagnosis not present

## 2020-03-04 DIAGNOSIS — M1990 Unspecified osteoarthritis, unspecified site: Secondary | ICD-10-CM | POA: Diagnosis not present

## 2020-03-04 DIAGNOSIS — H353131 Nonexudative age-related macular degeneration, bilateral, early dry stage: Secondary | ICD-10-CM | POA: Diagnosis not present

## 2020-03-04 DIAGNOSIS — M6281 Muscle weakness (generalized): Secondary | ICD-10-CM | POA: Diagnosis not present

## 2020-03-04 DIAGNOSIS — H04123 Dry eye syndrome of bilateral lacrimal glands: Secondary | ICD-10-CM | POA: Diagnosis not present

## 2020-03-04 DIAGNOSIS — H43813 Vitreous degeneration, bilateral: Secondary | ICD-10-CM | POA: Diagnosis not present

## 2020-03-04 DIAGNOSIS — E113293 Type 2 diabetes mellitus with mild nonproliferative diabetic retinopathy without macular edema, bilateral: Secondary | ICD-10-CM | POA: Diagnosis not present

## 2020-03-06 DIAGNOSIS — M1990 Unspecified osteoarthritis, unspecified site: Secondary | ICD-10-CM | POA: Diagnosis not present

## 2020-03-06 DIAGNOSIS — M6281 Muscle weakness (generalized): Secondary | ICD-10-CM | POA: Diagnosis not present

## 2020-03-07 DIAGNOSIS — M6281 Muscle weakness (generalized): Secondary | ICD-10-CM | POA: Diagnosis not present

## 2020-03-07 DIAGNOSIS — M1990 Unspecified osteoarthritis, unspecified site: Secondary | ICD-10-CM | POA: Diagnosis not present

## 2020-03-08 DIAGNOSIS — M1990 Unspecified osteoarthritis, unspecified site: Secondary | ICD-10-CM | POA: Diagnosis not present

## 2020-03-08 DIAGNOSIS — M6281 Muscle weakness (generalized): Secondary | ICD-10-CM | POA: Diagnosis not present

## 2020-03-10 DIAGNOSIS — M1990 Unspecified osteoarthritis, unspecified site: Secondary | ICD-10-CM | POA: Diagnosis not present

## 2020-03-10 DIAGNOSIS — M6281 Muscle weakness (generalized): Secondary | ICD-10-CM | POA: Diagnosis not present

## 2020-03-11 DIAGNOSIS — M1990 Unspecified osteoarthritis, unspecified site: Secondary | ICD-10-CM | POA: Diagnosis not present

## 2020-03-11 DIAGNOSIS — M6281 Muscle weakness (generalized): Secondary | ICD-10-CM | POA: Diagnosis not present

## 2020-03-12 DIAGNOSIS — E1151 Type 2 diabetes mellitus with diabetic peripheral angiopathy without gangrene: Secondary | ICD-10-CM | POA: Diagnosis not present

## 2020-03-14 DIAGNOSIS — M1990 Unspecified osteoarthritis, unspecified site: Secondary | ICD-10-CM | POA: Diagnosis not present

## 2020-03-14 DIAGNOSIS — M6281 Muscle weakness (generalized): Secondary | ICD-10-CM | POA: Diagnosis not present

## 2020-03-17 DIAGNOSIS — M6281 Muscle weakness (generalized): Secondary | ICD-10-CM | POA: Diagnosis not present

## 2020-03-17 DIAGNOSIS — M1990 Unspecified osteoarthritis, unspecified site: Secondary | ICD-10-CM | POA: Diagnosis not present

## 2020-03-19 DIAGNOSIS — M6281 Muscle weakness (generalized): Secondary | ICD-10-CM | POA: Diagnosis not present

## 2020-03-19 DIAGNOSIS — M1990 Unspecified osteoarthritis, unspecified site: Secondary | ICD-10-CM | POA: Diagnosis not present

## 2020-03-21 DIAGNOSIS — M6281 Muscle weakness (generalized): Secondary | ICD-10-CM | POA: Diagnosis not present

## 2020-03-21 DIAGNOSIS — M1990 Unspecified osteoarthritis, unspecified site: Secondary | ICD-10-CM | POA: Diagnosis not present

## 2020-03-26 DIAGNOSIS — M1990 Unspecified osteoarthritis, unspecified site: Secondary | ICD-10-CM | POA: Diagnosis not present

## 2020-03-26 DIAGNOSIS — M6281 Muscle weakness (generalized): Secondary | ICD-10-CM | POA: Diagnosis not present

## 2020-03-28 DIAGNOSIS — F331 Major depressive disorder, recurrent, moderate: Secondary | ICD-10-CM | POA: Diagnosis not present

## 2020-03-28 DIAGNOSIS — M6281 Muscle weakness (generalized): Secondary | ICD-10-CM | POA: Diagnosis not present

## 2020-03-28 DIAGNOSIS — F0391 Unspecified dementia with behavioral disturbance: Secondary | ICD-10-CM | POA: Diagnosis not present

## 2020-03-28 DIAGNOSIS — G4723 Circadian rhythm sleep disorder, irregular sleep wake type: Secondary | ICD-10-CM | POA: Diagnosis not present

## 2020-03-28 DIAGNOSIS — M1990 Unspecified osteoarthritis, unspecified site: Secondary | ICD-10-CM | POA: Diagnosis not present

## 2020-04-01 DIAGNOSIS — I10 Essential (primary) hypertension: Secondary | ICD-10-CM | POA: Diagnosis not present

## 2020-04-01 DIAGNOSIS — M1990 Unspecified osteoarthritis, unspecified site: Secondary | ICD-10-CM | POA: Diagnosis not present

## 2020-04-01 DIAGNOSIS — F0390 Unspecified dementia without behavioral disturbance: Secondary | ICD-10-CM | POA: Diagnosis not present

## 2020-04-01 DIAGNOSIS — E039 Hypothyroidism, unspecified: Secondary | ICD-10-CM | POA: Diagnosis not present

## 2020-04-01 DIAGNOSIS — M6281 Muscle weakness (generalized): Secondary | ICD-10-CM | POA: Diagnosis not present

## 2020-04-02 DIAGNOSIS — M6281 Muscle weakness (generalized): Secondary | ICD-10-CM | POA: Diagnosis not present

## 2020-04-02 DIAGNOSIS — M1990 Unspecified osteoarthritis, unspecified site: Secondary | ICD-10-CM | POA: Diagnosis not present

## 2020-04-03 DIAGNOSIS — M6281 Muscle weakness (generalized): Secondary | ICD-10-CM | POA: Diagnosis not present

## 2020-04-03 DIAGNOSIS — M1990 Unspecified osteoarthritis, unspecified site: Secondary | ICD-10-CM | POA: Diagnosis not present

## 2020-04-07 DIAGNOSIS — M6281 Muscle weakness (generalized): Secondary | ICD-10-CM | POA: Diagnosis not present

## 2020-04-07 DIAGNOSIS — M1990 Unspecified osteoarthritis, unspecified site: Secondary | ICD-10-CM | POA: Diagnosis not present

## 2020-04-08 DIAGNOSIS — M1990 Unspecified osteoarthritis, unspecified site: Secondary | ICD-10-CM | POA: Diagnosis not present

## 2020-04-08 DIAGNOSIS — M6281 Muscle weakness (generalized): Secondary | ICD-10-CM | POA: Diagnosis not present

## 2020-04-09 DIAGNOSIS — M6281 Muscle weakness (generalized): Secondary | ICD-10-CM | POA: Diagnosis not present

## 2020-04-09 DIAGNOSIS — M1990 Unspecified osteoarthritis, unspecified site: Secondary | ICD-10-CM | POA: Diagnosis not present

## 2020-04-10 DIAGNOSIS — M1990 Unspecified osteoarthritis, unspecified site: Secondary | ICD-10-CM | POA: Diagnosis not present

## 2020-04-10 DIAGNOSIS — M6281 Muscle weakness (generalized): Secondary | ICD-10-CM | POA: Diagnosis not present

## 2020-04-12 DIAGNOSIS — M6281 Muscle weakness (generalized): Secondary | ICD-10-CM | POA: Diagnosis not present

## 2020-04-12 DIAGNOSIS — M1990 Unspecified osteoarthritis, unspecified site: Secondary | ICD-10-CM | POA: Diagnosis not present

## 2020-04-14 DIAGNOSIS — M1990 Unspecified osteoarthritis, unspecified site: Secondary | ICD-10-CM | POA: Diagnosis not present

## 2020-04-14 DIAGNOSIS — M6281 Muscle weakness (generalized): Secondary | ICD-10-CM | POA: Diagnosis not present

## 2020-04-15 DIAGNOSIS — M1990 Unspecified osteoarthritis, unspecified site: Secondary | ICD-10-CM | POA: Diagnosis not present

## 2020-04-15 DIAGNOSIS — M6281 Muscle weakness (generalized): Secondary | ICD-10-CM | POA: Diagnosis not present

## 2020-04-16 DIAGNOSIS — M1990 Unspecified osteoarthritis, unspecified site: Secondary | ICD-10-CM | POA: Diagnosis not present

## 2020-04-16 DIAGNOSIS — M6281 Muscle weakness (generalized): Secondary | ICD-10-CM | POA: Diagnosis not present

## 2020-04-17 DIAGNOSIS — M1990 Unspecified osteoarthritis, unspecified site: Secondary | ICD-10-CM | POA: Diagnosis not present

## 2020-04-17 DIAGNOSIS — M6281 Muscle weakness (generalized): Secondary | ICD-10-CM | POA: Diagnosis not present

## 2020-04-18 DIAGNOSIS — M1990 Unspecified osteoarthritis, unspecified site: Secondary | ICD-10-CM | POA: Diagnosis not present

## 2020-04-18 DIAGNOSIS — M6281 Muscle weakness (generalized): Secondary | ICD-10-CM | POA: Diagnosis not present

## 2020-04-21 DIAGNOSIS — M6281 Muscle weakness (generalized): Secondary | ICD-10-CM | POA: Diagnosis not present

## 2020-04-21 DIAGNOSIS — M1990 Unspecified osteoarthritis, unspecified site: Secondary | ICD-10-CM | POA: Diagnosis not present

## 2020-04-25 DIAGNOSIS — F0391 Unspecified dementia with behavioral disturbance: Secondary | ICD-10-CM | POA: Diagnosis not present

## 2020-04-25 DIAGNOSIS — G4723 Circadian rhythm sleep disorder, irregular sleep wake type: Secondary | ICD-10-CM | POA: Diagnosis not present

## 2020-04-25 DIAGNOSIS — F331 Major depressive disorder, recurrent, moderate: Secondary | ICD-10-CM | POA: Diagnosis not present

## 2020-05-23 DIAGNOSIS — G4723 Circadian rhythm sleep disorder, irregular sleep wake type: Secondary | ICD-10-CM | POA: Diagnosis not present

## 2020-05-23 DIAGNOSIS — F0391 Unspecified dementia with behavioral disturbance: Secondary | ICD-10-CM | POA: Diagnosis not present

## 2020-05-23 DIAGNOSIS — F331 Major depressive disorder, recurrent, moderate: Secondary | ICD-10-CM | POA: Diagnosis not present

## 2020-05-26 DIAGNOSIS — I10 Essential (primary) hypertension: Secondary | ICD-10-CM | POA: Diagnosis not present

## 2020-05-26 DIAGNOSIS — E559 Vitamin D deficiency, unspecified: Secondary | ICD-10-CM | POA: Diagnosis not present

## 2020-05-26 DIAGNOSIS — E119 Type 2 diabetes mellitus without complications: Secondary | ICD-10-CM | POA: Diagnosis not present

## 2020-05-26 DIAGNOSIS — F329 Major depressive disorder, single episode, unspecified: Secondary | ICD-10-CM | POA: Diagnosis not present

## 2020-05-26 DIAGNOSIS — E785 Hyperlipidemia, unspecified: Secondary | ICD-10-CM | POA: Diagnosis not present

## 2020-05-26 DIAGNOSIS — I2510 Atherosclerotic heart disease of native coronary artery without angina pectoris: Secondary | ICD-10-CM | POA: Diagnosis not present

## 2020-05-26 DIAGNOSIS — F0390 Unspecified dementia without behavioral disturbance: Secondary | ICD-10-CM | POA: Diagnosis not present

## 2020-05-26 DIAGNOSIS — E039 Hypothyroidism, unspecified: Secondary | ICD-10-CM | POA: Diagnosis not present

## 2020-05-28 DIAGNOSIS — E1151 Type 2 diabetes mellitus with diabetic peripheral angiopathy without gangrene: Secondary | ICD-10-CM | POA: Diagnosis not present

## 2020-05-28 DIAGNOSIS — L84 Corns and callosities: Secondary | ICD-10-CM | POA: Diagnosis not present

## 2020-06-09 DIAGNOSIS — E119 Type 2 diabetes mellitus without complications: Secondary | ICD-10-CM | POA: Diagnosis not present

## 2020-06-09 DIAGNOSIS — I10 Essential (primary) hypertension: Secondary | ICD-10-CM | POA: Diagnosis not present

## 2020-06-09 DIAGNOSIS — E785 Hyperlipidemia, unspecified: Secondary | ICD-10-CM | POA: Diagnosis not present

## 2020-06-09 DIAGNOSIS — F0390 Unspecified dementia without behavioral disturbance: Secondary | ICD-10-CM | POA: Diagnosis not present

## 2020-06-09 DIAGNOSIS — E039 Hypothyroidism, unspecified: Secondary | ICD-10-CM | POA: Diagnosis not present

## 2020-06-09 DIAGNOSIS — E559 Vitamin D deficiency, unspecified: Secondary | ICD-10-CM | POA: Diagnosis not present

## 2020-06-09 DIAGNOSIS — I2510 Atherosclerotic heart disease of native coronary artery without angina pectoris: Secondary | ICD-10-CM | POA: Diagnosis not present

## 2020-06-11 DIAGNOSIS — G8911 Acute pain due to trauma: Secondary | ICD-10-CM | POA: Diagnosis not present

## 2021-09-12 DEATH — deceased
